# Patient Record
Sex: Female | Born: 1960 | Race: White | Hispanic: No | Marital: Single | Smoking: Former smoker
Health system: Southern US, Community
[De-identification: ages and names within clinical notes are randomized; demographics above are authoritative.]

## PROBLEM LIST (undated history)

## (undated) DIAGNOSIS — E785 Hyperlipidemia, unspecified: Secondary | ICD-10-CM

## (undated) DIAGNOSIS — IMO0002 Reserved for concepts with insufficient information to code with codable children: Secondary | ICD-10-CM

## (undated) DIAGNOSIS — D649 Anemia, unspecified: Secondary | ICD-10-CM

## (undated) DIAGNOSIS — T7840XA Allergy, unspecified, initial encounter: Secondary | ICD-10-CM

## (undated) DIAGNOSIS — M329 Systemic lupus erythematosus, unspecified: Secondary | ICD-10-CM

## (undated) DIAGNOSIS — J189 Pneumonia, unspecified organism: Secondary | ICD-10-CM

## (undated) HISTORY — DX: Hyperlipidemia, unspecified: E78.5

## (undated) HISTORY — DX: Allergy, unspecified, initial encounter: T78.40XA

## (undated) HISTORY — PX: WISDOM TOOTH EXTRACTION: SHX21

## (undated) HISTORY — DX: Anemia, unspecified: D64.9

## (undated) HISTORY — PX: TONSILLECTOMY: SUR1361

---

## 2018-08-31 ENCOUNTER — Ambulatory Visit (INDEPENDENT_AMBULATORY_CARE_PROVIDER_SITE_OTHER): Payer: Medicare Other | Admitting: Psychology

## 2018-08-31 ENCOUNTER — Encounter (HOSPITAL_COMMUNITY): Payer: Self-pay | Admitting: Psychology

## 2018-08-31 DIAGNOSIS — F4312 Post-traumatic stress disorder, chronic: Secondary | ICD-10-CM

## 2018-08-31 DIAGNOSIS — M329 Systemic lupus erythematosus, unspecified: Secondary | ICD-10-CM

## 2018-08-31 DIAGNOSIS — F1020 Alcohol dependence, uncomplicated: Secondary | ICD-10-CM
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
## 2018-09-03 ENCOUNTER — Telehealth (HOSPITAL_COMMUNITY): Payer: Self-pay | Admitting: Psychology

## 2018-09-07 ENCOUNTER — Telehealth (HOSPITAL_COMMUNITY): Payer: Self-pay | Admitting: Licensed Clinical Social Worker
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
## 2018-09-07 NOTE — Telephone Encounter (Signed)
PT called to inquire and schedule orientation session to start CD-IOP in this office. Appointment was made for Tuesday 3/10 at 10:30AM. PT verbalized enthusiasm and openness.

## 2018-09-11 ENCOUNTER — Ambulatory Visit (INDEPENDENT_AMBULATORY_CARE_PROVIDER_SITE_OTHER): Payer: Self-pay | Admitting: Licensed Clinical Social Worker

## 2018-09-11 DIAGNOSIS — F411 Generalized anxiety disorder: Secondary | ICD-10-CM

## 2018-09-11 DIAGNOSIS — F1020 Alcohol dependence, uncomplicated: Secondary | ICD-10-CM

## 2018-09-11 DIAGNOSIS — F4312 Post-traumatic stress disorder, chronic: Secondary | ICD-10-CM

## 2018-09-12 ENCOUNTER — Telehealth (HOSPITAL_COMMUNITY): Payer: Self-pay | Admitting: Psychology

## 2018-09-18 ENCOUNTER — Encounter (HOSPITAL_COMMUNITY): Payer: Self-pay | Admitting: Licensed Clinical Social Worker
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
## 2018-10-08 ENCOUNTER — Telehealth (HOSPITAL_COMMUNITY): Payer: Self-pay | Admitting: Psychology

## 2018-10-10 ENCOUNTER — Telehealth (HOSPITAL_COMMUNITY): Payer: Self-pay | Admitting: Psychology

## 2019-01-28 ENCOUNTER — Telehealth (HOSPITAL_COMMUNITY): Payer: Self-pay | Admitting: Licensed Clinical Social Worker

## 2019-03-21 ENCOUNTER — Telehealth (HOSPITAL_COMMUNITY): Payer: Self-pay | Admitting: Licensed Clinical Social Worker

## 2019-03-21 NOTE — Telephone Encounter (Signed)
Clinician return client phone call requesting to schedule discussion about engaging in [place] after not starting at initial start date due to health concerns. Clinician left VM as requested for return contact.

## 2019-03-22 ENCOUNTER — Telehealth (HOSPITAL_COMMUNITY): Payer: Self-pay | Admitting: Licensed Clinical Social Worker

## 2019-03-26 ENCOUNTER — Ambulatory Visit (INDEPENDENT_AMBULATORY_CARE_PROVIDER_SITE_OTHER): Payer: Medicare Other | Admitting: Licensed Clinical Social Worker

## 2019-03-26 ENCOUNTER — Other Ambulatory Visit: Payer: Self-pay

## 2019-03-26 DIAGNOSIS — F1020 Alcohol dependence, uncomplicated: Secondary | ICD-10-CM

## 2019-03-26 NOTE — Progress Notes (Signed)
Virtual Visit via Telephone Note  I connected with Mayrin Schmuck on 03/26/19 at  3:00 PM EDT by telephone and verified that I am speaking with the correct person using two identifiers.   I discussed the limitations, risks, security and privacy concerns of performing an evaluation and management service by telephone and the availability of in person appointments. I also discussed with the patient that there may be a patient responsible charge related to this service. The patient expressed understanding and agreed to proceed.  I discussed the assessment and treatment plan with the patient. The patient was provided an opportunity to ask questions and all were answered. The patient agreed with the plan and demonstrated an understanding of the instructions.   The patient was advised to call back or seek an in-person evaluation if the symptoms worsen or if the condition fails to improve as anticipated.  I provided 20 minutes of non-face-to-face time during this encounter.   [name], LCSW    THERAPIST PROGRESS NOTE  Session Time: 3:05pm-3:40pm  Participation Level: Active  Behavioral Response: NAAlertDysphoric and labile  Type of Therapy: Individual Therapy  Treatment Goals addressed: Coping and Diagnosis: alcohol use disorder  Interventions: Motivational Interviewing and Supportive  Summary: Shontell Prosser is a 58 y.o. female who presents with alcohol use disorder. Client states she has been drinking 2 bottles of wine every few days, 'because of my lupus it takes me a while to recover.' Client reports people close to her are saying they are worried and she is having 'new behaviors that are scary' which include day drinking and smoking cigarettes, which she only does when she drinks. 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 Client stated she has a call with [facility] for short term inpatient and is open to  keeping that planned contact for the afternoon however acknowledged going to inpatient or residential tx would be more of a superficial gesture to others than something she believes would help her. Client is interested in entering into [facility] and talking with PA about medications to help with cravings.  Suicidal/Homicidal: Nowithout intent/plan  Therapist Response: Clinician met with client via telehealth due to client forgetting date of scheduled appointment. Clinician inquired about current living situation and recent substance use. Clinician and client discussed options for different levels of care based on current level of alcohol use.  Plan: Return again in 1 weeks.   Diagnosis: Axis I: Alcohol Abuse      [name], LCSW 03/26/2019

## 2019-03-27 ENCOUNTER — Telehealth (HOSPITAL_COMMUNITY): Payer: Self-pay | Admitting: Licensed Clinical Social Worker

## 2019-04-12 ENCOUNTER — Other Ambulatory Visit: Payer: Self-pay

## 2019-04-12 ENCOUNTER — Ambulatory Visit (INDEPENDENT_AMBULATORY_CARE_PROVIDER_SITE_OTHER): Payer: Medicare Other | Admitting: Licensed Clinical Social Worker

## 2019-04-12 DIAGNOSIS — F1020 Alcohol dependence, uncomplicated: Secondary | ICD-10-CM

## 2019-04-12 DIAGNOSIS — F411 Generalized anxiety disorder: Secondary | ICD-10-CM | POA: Diagnosis not present

## 2019-04-22 NOTE — Progress Notes (Signed)
Virtual Visit via Telephone Note  I connected with Deanna Fischer on 04/22/19 at 11:00 AM EDT by telephone and verified that I am speaking with the correct person using two identifiers.   I discussed the limitations, risks, security and privacy concerns of performing an evaluation and management service by telephone and the availability of in person appointments. I also discussed with the patient that there may be a patient responsible charge related to this service. The patient expressed understanding and agreed to proceed.  I discussed the assessment and treatment plan with the patient. The patient was provided an opportunity to ask questions and all were answered. The patient agreed with the plan and demonstrated an understanding of the instructions.   The patient was advised to call back or seek an in-person evaluation if the symptoms worsen or if the condition fails to improve as anticipated.  I provided 45 minutes of non-face-to-face time during this encounter.   Deanna Messier, LCSW    THERAPIST PROGRESS NOTE  Session Time: 11am-11:50am  Participation Level: Active  Behavioral Response: NAAlertAnxious  Type of Therapy: Individual Therapy  Treatment Goals addressed: Coping and Diagnosis: Alcohol Use disorder  Interventions: Motivational Interviewing  Summary: Deanna Fischer is a 58 y.o. female who presents with alcohol use disorder. Client shared recent stressors, including finances which was a primary concern for her at the time. Client was notified medicare covers 80% of services and client states she is unable to pay the additional 20%. Client would like to complete the scholarship paperwork and be approved prior to starting group. Client is open to meeting with clinician individually in the mean time and attending AA regularly.  Suicidal/Homicidal: Nowithout intent/plan  Therapist Response: Clinician and client discuss progress and challenges since discharging from  inpatient detox. Clinician provided client with resources for local AA engagement, virtual and face to face. Clinician and client reviewed intake information, group rules, and insurance coverage. Due to financial strain, clinician sent client financial assistance/scholoarship paperwork.  Plan: Return again in 1 weeks.  Diagnosis: Axis I: Alcohol Abuse        Deanna Messier, LCSW 04/12/2019

## 2019-04-24 ENCOUNTER — Telehealth (HOSPITAL_COMMUNITY): Payer: Self-pay | Admitting: Licensed Clinical Social Worker

## 2020-04-16 ENCOUNTER — Ambulatory Visit: Payer: Medicare Other | Admitting: Neurology

## 2020-04-24 DIAGNOSIS — F4310 Post-traumatic stress disorder, unspecified: Secondary | ICD-10-CM | POA: Insufficient documentation

## 2020-04-24 HISTORY — DX: Post-traumatic stress disorder, unspecified: F43.10

## 2020-12-16 ENCOUNTER — Ambulatory Visit: Payer: Medicare Other | Admitting: Internal Medicine

## 2021-01-20 ENCOUNTER — Encounter: Payer: Self-pay | Admitting: Gastroenterology

## 2021-02-15 ENCOUNTER — Emergency Department (HOSPITAL_COMMUNITY)
Admission: EM | Admit: 2021-02-15 | Discharge: 2021-02-15 | Disposition: A | Discharge status: Home / Self Care | Payer: Medicare Other | Source: Home / Self Care | Attending: Emergency Medicine | Admitting: Emergency Medicine

## 2021-02-15 ENCOUNTER — Emergency Department (HOSPITAL_COMMUNITY)
Admission: EM | Admit: 2021-02-15 | Discharge: 2021-02-15 | Disposition: A | Discharge status: Home / Self Care | Payer: Medicare Other | Attending: Emergency Medicine | Admitting: Emergency Medicine

## 2021-02-15 ENCOUNTER — Other Ambulatory Visit: Payer: Self-pay

## 2021-02-15 ENCOUNTER — Emergency Department (HOSPITAL_COMMUNITY): Payer: Medicare Other

## 2021-02-15 DIAGNOSIS — W541XXA Struck by dog, initial encounter: Secondary | ICD-10-CM | POA: Insufficient documentation

## 2021-02-15 DIAGNOSIS — Y93K1 Activity, walking an animal: Secondary | ICD-10-CM | POA: Insufficient documentation

## 2021-02-15 DIAGNOSIS — F17210 Nicotine dependence, cigarettes, uncomplicated: Secondary | ICD-10-CM | POA: Insufficient documentation

## 2021-02-15 DIAGNOSIS — S022XXA Fracture of nasal bones, initial encounter for closed fracture: Secondary | ICD-10-CM | POA: Diagnosis not present

## 2021-02-15 DIAGNOSIS — Z5321 Procedure and treatment not carried out due to patient leaving prior to being seen by health care provider: Secondary | ICD-10-CM | POA: Insufficient documentation

## 2021-02-15 DIAGNOSIS — S0993XA Unspecified injury of face, initial encounter: Secondary | ICD-10-CM | POA: Diagnosis present

## 2021-02-15 DIAGNOSIS — S00212A Abrasion of left eyelid and periocular area, initial encounter: Secondary | ICD-10-CM | POA: Insufficient documentation

## 2021-02-15 DIAGNOSIS — Z23 Encounter for immunization: Secondary | ICD-10-CM | POA: Insufficient documentation

## 2021-02-15 DIAGNOSIS — S0081XA Abrasion of other part of head, initial encounter: Secondary | ICD-10-CM | POA: Insufficient documentation

## 2021-02-15 DIAGNOSIS — S62646A Nondisplaced fracture of proximal phalanx of right little finger, initial encounter for closed fracture: Secondary | ICD-10-CM

## 2021-02-15 DIAGNOSIS — R519 Headache, unspecified: Secondary | ICD-10-CM | POA: Insufficient documentation

## 2021-02-15 DIAGNOSIS — W19XXXA Unspecified fall, initial encounter: Secondary | ICD-10-CM | POA: Insufficient documentation

## 2021-02-15 MED ORDER — ONDANSETRON 4 MG PO TBDP: 4.0000 mg | ORAL_TABLET | Freq: Three times a day (TID) | ORAL | 0 refills | Status: DC | PRN

## 2021-02-15 MED ORDER — ACETAMINOPHEN 500 MG PO TABS
1000.0000 mg | ORAL_TABLET | Freq: Once | ORAL | Status: AC
  Administered 2021-02-15: 1000 mg via ORAL
  Filled 2021-02-15: qty 2

## 2021-02-15 MED ORDER — TETANUS-DIPHTH-ACELL PERTUSSIS 5-2.5-18.5 LF-MCG/0.5 IM SUSY
0.5000 mL | PREFILLED_SYRINGE | Freq: Once | INTRAMUSCULAR | Status: AC
  Administered 2021-02-15: 0.5 mL via INTRAMUSCULAR
  Filled 2021-02-15: qty 0.5

## 2021-02-15 MED ORDER — OXYCODONE HCL 5 MG PO TABS
5.0000 mg | ORAL_TABLET | Freq: Once | ORAL | Status: AC
  Administered 2021-02-15: 5 mg via ORAL
  Filled 2021-02-15: qty 1

## 2021-02-15 NOTE — Progress Notes (Signed)
Orthopedic Tech Progress Note Patient Details:  Margaree Sandhu 30-May-1961 [id number]  Ortho Devices Type of Ortho Device: Arm sling, Ulna gutter splint Ortho Device/Splint Location: rue Ortho Device/Splint Interventions: Ordered, Application, Adjustment   Post Interventions Patient Tolerated: Well Instructions Provided: Adjustment of device  Karolee Stamps 02/15/2021, 10:04 PM

## 2021-02-15 NOTE — ED Notes (Signed)
Patient states she is leaving d/t wait tie and having to be home. Advised patient to stay. Patient states she is fine and her taxi is on the way

## 2021-02-15 NOTE — ED Provider Notes (Signed)
Emergency Medicine Provider Triage Evaluation Note  Deanna Fischer , a 60 y.o. female  was evaluated in triage.  Pt complains of a fall that occurred last night while walking her dog. Had ct scans completed but left ama. Here for eval now and c/o right hand pain.  Review of Systems  Positive: Facial pain, hand pain Negative: vomiting  Physical Exam  BP 122/75 (BP Location: Right Arm)   Pulse 80   Temp 99.7 F (37.6 C)   Resp 16   SpO2 100%  Gen:   Awake, no distress   Resp:  Normal effort  MSK:   Moves extremities without difficulty  Other:  Superficial abrasion to the left side of the face, ecchymosis to the right hand  Medical Decision Making  Medically screening exam initiated at 3:58 PM.  Appropriate orders placed.  Deanna Fischer was informed that the remainder of the evaluation will be completed by another provider, this initial triage assessment does not replace that evaluation, and the importance of remaining in the ED until their evaluation is complete.     Rodney Booze, PA-C 02/15/21 1600    Lorelle Gibbs, DO 02/17/21 2321

## 2021-02-15 NOTE — ED Triage Notes (Addendum)
Pt states "A: I have lupus, B: I've got a lot going on." Pt states she fell approx 3hrs ago, ?LOC, not on thinners Pt states she noticed blood but isn't bleeding. States it was a bad fall, but "not so bad, just not good- this has never happened to me before." When asked how much alcohol she's had to drink tonight- she responded "I'm going to be very specific, but what time?" Then states "the equivalent of a bottle & a half but we're on a new day, so nothing..... but like 3 glasses." Poor historian, +ETOH. Abrasions noted to L side of the face, bleeding controlled in triage.  Pt states she believes quitting smoking & then having a cigarette last night is relevant. Unsure if something is wrong w her heart.

## 2021-02-15 NOTE — ED Provider Notes (Signed)
[facility] EMERGENCY DEPARTMENT Provider Note   CSN: [id number] Arrival date & time: 02/15/21  1509     History CC - Fall  Deanna Fischer is a 60 y.o. female.  HPI  60 year old female with a past medical history of SLE on Plaquenil presenting to the emergency department with left face pain and right hand pain following a fall.  Patient states that she was walking her dog yesterday when it saw another animal and took off running, pulling her to the ground.  She states that she stretched out her right hand to break her fall, but also impacted her left face.  She states that she is right-handed.  She was initially having a headache and some nausea and so came here to be evaluated.  She obtained CT scans but left due to prolonged wait times without being evaluated.  She came back at the recommendations of her friends.  She states that her headache and nausea have resolved.  She denies any neck pain.  She reports left face pain associated with some abrasions there, burning, constant.  It was not improved by coconut oil applied last night.  It was slightly improved with Tylenol.  Patient also reports right hand pain, mild, aching, around her fifth digit, nonradiating.  Is worse with using her right hand.  No past medical history on file.  There are no problems to display for this patient.   No past surgical history on file.   OB History   No obstetric history on file.     No family history on file.  Social History   Tobacco Use   Smoking status: Every Day    Packs/day: 0.50    Years: 25.00    Pack years: 12.50    Types: Cigarettes   Smokeless tobacco: Never    Home Medications Prior to Admission medications   Medication Sig Start Date End Date Taking? Authorizing Provider  ondansetron (ZOFRAN ODT) 4 MG disintegrating tablet Take 1 tablet (4 mg total) by mouth every 8 (eight) hours as needed for nausea or vomiting. 02/15/21  Yes Claud Kelp, MD   hydroxychloroquine (PLAQUENIL) 200 MG tablet Take 200 mg by mouth daily.    [provider]  levothyroxine (SYNTHROID, LEVOTHROID) 100 MCG tablet Take 100 mcg by mouth daily before breakfast.    [provider]    Allergies    Patient has no known allergies.  Review of Systems   Review of Systems  Constitutional:  Negative for chills and fever.  HENT:  Negative for ear pain and sore throat.   Eyes:  Negative for pain and visual disturbance.  Respiratory:  Negative for cough and shortness of breath.   Cardiovascular:  Negative for chest pain and palpitations.  Gastrointestinal:  Negative for abdominal pain and vomiting.  Genitourinary:  Negative for dysuria and hematuria.  Musculoskeletal:  Positive for arthralgias. Negative for back pain.  Skin:  Positive for wound. Negative for color change and rash.  Neurological:  Negative for seizures and syncope.  All other systems reviewed and are negative.  Physical Exam Updated Vital Signs BP (!) 145/85   Pulse (!) 57   Temp 98.1 F (36.7 C) (Oral)   Resp 17   Ht 5' 5"  (1.651 m)   Wt 56.7 kg   SpO2 98%   BMI 20.80 kg/m   Physical Exam Vitals and nursing note reviewed.  Constitutional:      General: She is not in acute distress.  Appearance: She is well-developed. She is not ill-appearing or toxic-appearing.  HENT:     Head: Normocephalic.     Comments: Scattered abrasions to the left periorbit, bridge of the nose, left cheek.  Mild left periorbital swelling.  No eye proptosis.  Extraocular movements are intact.  No septal hematoma.  Mild swelling to the nasal bridge with tenderness to palpation.  Midface is stable.  No trismus.  No malocclusion. Eyes:     Conjunctiva/sclera: Conjunctivae normal.  Cardiovascular:     Rate and Rhythm: Normal rate and regular rhythm.     Heart sounds: No murmur heard. Pulmonary:     Effort: Pulmonary effort is normal. No respiratory distress.     Breath sounds: Normal breath  sounds.  Abdominal:     Palpations: Abdomen is soft.     Tenderness: There is no abdominal tenderness. There is no guarding or rebound.  Musculoskeletal:        General: Swelling and tenderness present.     Cervical back: Normal range of motion and neck supple. No rigidity or tenderness.     Comments: Swelling and ecchymosis to the base of the right fifth digit on the dorsal aspect of the hand.  Associated tenderness to palpation.  Full range of motion in the right fifth MCP, PIP, and DIP.  2+ radial pulse.  Skin:    General: Skin is warm and dry.     Capillary Refill: Capillary refill takes less than 2 seconds.  Neurological:     Mental Status: She is alert and oriented to person, place, and time.    ED Results / Procedures / Treatments   Labs (all labs ordered are listed, but only abnormal results are displayed) Labs Reviewed - No data to display  EKG None  Radiology CT HEAD WO CONTRAST (5MM)  Result Date: 02/15/2021 CLINICAL DATA:  Fall EXAM: CT HEAD WITHOUT CONTRAST CT MAXILLOFACIAL WITHOUT CONTRAST CT CERVICAL SPINE WITHOUT CONTRAST TECHNIQUE: Multidetector CT imaging of the head, cervical spine, and maxillofacial structures were performed using the standard protocol without intravenous contrast. Multiplanar CT image reconstructions of the cervical spine and maxillofacial structures were also generated. COMPARISON:  None. FINDINGS: CT HEAD FINDINGS Brain: There is no mass, hemorrhage or extra-axial collection. The size and configuration of the ventricles and extra-axial CSF spaces are normal. The brain parenchyma is normal, without evidence of acute or chronic infarction. Vascular: No abnormal hyperdensity of the major intracranial arteries or dural venous sinuses. No intracranial atherosclerosis. Skull: The visualized skull base, calvarium and extracranial soft tissues are normal. CT MAXILLOFACIAL FINDINGS Osseous: There is a minimally displaced fracture of the right nasal bone.  Orbits: The globes are intact. Normal appearance of the intra- and extraconal fat. Symmetric extraocular muscles and optic nerves. Sinuses: No fluid levels or advanced mucosal thickening. Soft tissues: Normal visualized extracranial soft tissues. CT CERVICAL SPINE FINDINGS Alignment: No static subluxation. Facets are aligned. Occipital condyles and the lateral masses of C1-C2 are aligned. Reversal of normal cervical lordosis may be positional or due to muscle spasm. Skull base and vertebrae: No acute fracture. Soft tissues and spinal canal: No prevertebral fluid or swelling. No visible canal hematoma. Disc levels: No advanced spinal canal or neural foraminal stenosis. Upper chest: No pneumothorax, pulmonary nodule or pleural effusion. Other: Normal visualized paraspinal cervical soft tissues. IMPRESSION: 1. No acute intracranial abnormality. 2. Minimally displaced fracture of the right nasal bone. 3. No acute fracture or static subluxation of the cervical spine. Electronically Signed   By:  Ulyses Jarred M.D.   On: 02/15/2021 03:16   CT CERVICAL SPINE WO CONTRAST  Result Date: 02/15/2021 CLINICAL DATA:  Fall EXAM: CT HEAD WITHOUT CONTRAST CT MAXILLOFACIAL WITHOUT CONTRAST CT CERVICAL SPINE WITHOUT CONTRAST TECHNIQUE: Multidetector CT imaging of the head, cervical spine, and maxillofacial structures were performed using the standard protocol without intravenous contrast. Multiplanar CT image reconstructions of the cervical spine and maxillofacial structures were also generated. COMPARISON:  None. FINDINGS: CT HEAD FINDINGS Brain: There is no mass, hemorrhage or extra-axial collection. The size and configuration of the ventricles and extra-axial CSF spaces are normal. The brain parenchyma is normal, without evidence of acute or chronic infarction. Vascular: No abnormal hyperdensity of the major intracranial arteries or dural venous sinuses. No intracranial atherosclerosis. Skull: The visualized skull base,  calvarium and extracranial soft tissues are normal. CT MAXILLOFACIAL FINDINGS Osseous: There is a minimally displaced fracture of the right nasal bone. Orbits: The globes are intact. Normal appearance of the intra- and extraconal fat. Symmetric extraocular muscles and optic nerves. Sinuses: No fluid levels or advanced mucosal thickening. Soft tissues: Normal visualized extracranial soft tissues. CT CERVICAL SPINE FINDINGS Alignment: No static subluxation. Facets are aligned. Occipital condyles and the lateral masses of C1-C2 are aligned. Reversal of normal cervical lordosis may be positional or due to muscle spasm. Skull base and vertebrae: No acute fracture. Soft tissues and spinal canal: No prevertebral fluid or swelling. No visible canal hematoma. Disc levels: No advanced spinal canal or neural foraminal stenosis. Upper chest: No pneumothorax, pulmonary nodule or pleural effusion. Other: Normal visualized paraspinal cervical soft tissues. IMPRESSION: 1. No acute intracranial abnormality. 2. Minimally displaced fracture of the right nasal bone. 3. No acute fracture or static subluxation of the cervical spine. Electronically Signed   By: Ulyses Jarred M.D.   On: 02/15/2021 03:16   DG Hand Complete Right  Result Date: 02/15/2021 CLINICAL DATA:  Right hand pain status post fall breakage EXAM: RIGHT HAND - COMPLETE 3+ VIEW COMPARISON:  None. FINDINGS: Mildly displaced fracture of the lateral aspect of the base of the proximal phalanx of the little/fifth finger, which involves the articular surface and is seen best on the PA radiograph. There is no other fracture or dislocation. The joint spaces are preserved. Soft tissues are unremarkable. IMPRESSION: Mildly displaced fracture through the lateral aspect of the base of the proximal phalanx of the little finger, involving the articular surface. Electronically Signed   By: Merilyn Baba M.D.   On: 02/15/2021 16:45   CT Maxillofacial Wo Contrast  Result Date:  02/15/2021 CLINICAL DATA:  Fall EXAM: CT HEAD WITHOUT CONTRAST CT MAXILLOFACIAL WITHOUT CONTRAST CT CERVICAL SPINE WITHOUT CONTRAST TECHNIQUE: Multidetector CT imaging of the head, cervical spine, and maxillofacial structures were performed using the standard protocol without intravenous contrast. Multiplanar CT image reconstructions of the cervical spine and maxillofacial structures were also generated. COMPARISON:  None. FINDINGS: CT HEAD FINDINGS Brain: There is no mass, hemorrhage or extra-axial collection. The size and configuration of the ventricles and extra-axial CSF spaces are normal. The brain parenchyma is normal, without evidence of acute or chronic infarction. Vascular: No abnormal hyperdensity of the major intracranial arteries or dural venous sinuses. No intracranial atherosclerosis. Skull: The visualized skull base, calvarium and extracranial soft tissues are normal. CT MAXILLOFACIAL FINDINGS Osseous: There is a minimally displaced fracture of the right nasal bone. Orbits: The globes are intact. Normal appearance of the intra- and extraconal fat. Symmetric extraocular muscles and optic nerves. Sinuses: No fluid levels or  advanced mucosal thickening. Soft tissues: Normal visualized extracranial soft tissues. CT CERVICAL SPINE FINDINGS Alignment: No static subluxation. Facets are aligned. Occipital condyles and the lateral masses of C1-C2 are aligned. Reversal of normal cervical lordosis may be positional or due to muscle spasm. Skull base and vertebrae: No acute fracture. Soft tissues and spinal canal: No prevertebral fluid or swelling. No visible canal hematoma. Disc levels: No advanced spinal canal or neural foraminal stenosis. Upper chest: No pneumothorax, pulmonary nodule or pleural effusion. Other: Normal visualized paraspinal cervical soft tissues. IMPRESSION: 1. No acute intracranial abnormality. 2. Minimally displaced fracture of the right nasal bone. 3. No acute fracture or static subluxation  of the cervical spine. Electronically Signed   By: Ulyses Jarred M.D.   On: 02/15/2021 03:16    Procedures Procedures   Medications Ordered in ED Medications  Tdap (BOOSTRIX) injection 0.5 mL (0.5 mLs Intramuscular Given 02/15/21 2111)  oxyCODONE (Oxy IR/ROXICODONE) immediate release tablet 5 mg (5 mg Oral Given 02/15/21 2109)  acetaminophen (TYLENOL) tablet 1,000 mg (1,000 mg Oral Given 02/15/21 2109)    ED Course  I have reviewed the triage vital signs and the nursing notes.  Pertinent labs & imaging results that were available during my care of the patient were reviewed by me and considered in my medical decision making (see chart for details).    MDM Rules/Calculators/A&P                           60 year old female with lupus presenting with face pain and right hand pain following a mechanical fall yesterday.  Vital signs reviewed, within acceptable limits.  Physical exam is notable for abrasions to the left face and swelling tenderness to the right medial hand.  She has no septal hematoma, no eye proptosis.  There is no evidence of a retrobulbar hemorrhage or entrapment.  No septal deviation.  No trismus or malocclusion.  She has a normal neurologic exam.  I reviewed the imaging that was obtained yesterday, she has no skull fracture or intracranial hemorrhage.  She has a small nondisplaced right nasal bone fracture.  Given that she has no septal hematoma and is breathing easily, will provide outpatient ENT follow-up but is no emergent consultation indicated.  For her abrasions, recommended routine wound care.  For the patient's right hand, she has a acute fracture of the phalanx at the base of the fifth digit.  She is neurovascularly intact distal to this injury and has full range of motion in all the joints distally.  Will immobilize in an ulnar gutter splint in the intrinsic hand position and refer her to orthopedic surgery as an outpatient.  Will provide analgesia here and recommended  over-the-counter analgesics at home.  Given her nausea and headache that she reported earlier, she likely had a small concussion in light of negative imaging but she is currently asymptomatic.  Will provide concussion precautions and an outpatient prescription for Zofran as needed.  I believe that she is stable for discharge from the emergency department.  Patient is comfortable with that plan.  Strict return precautions discussed.  Final Clinical Impression(s) / ED Diagnoses Final diagnoses:  Closed fracture of nasal bone, initial encounter  Closed nondisplaced fracture of proximal phalanx of right little finger, initial encounter    Rx / DC Orders ED Discharge Orders          Ordered    ondansetron (ZOFRAN ODT) 4 MG disintegrating tablet  Every 8  hours PRN        02/15/21 2049             Claud Kelp, MD 02/15/21 2315    Luna Fuse, MD 02/18/21 [phone]

## 2021-02-15 NOTE — ED Triage Notes (Signed)
Pt was pulled by her dog and face planted on her concrete walkway last night. Seen for same last night and had CT scan done but LWBS. Back today for eval of dizziness, feeling "spacey" and nauseas.

## 2021-02-15 NOTE — ED Provider Notes (Signed)
Emergency Medicine Provider Triage Evaluation Note  Deanna Fischer , a 60 y.o. female  was evaluated in triage.  Pt complains of fall.  Patient fell today.  She is unsure if there was a loss of consciousness.  She cannot elaborate on how she fell.  She has EtOH on board.  There are numerous wounds and superficial abrasion noted to the left side of the face.  She is endorsing facial pain.  No chest pain, shortness of breath, abdominal pain, back pain, numbness, or weakness.  No treatment prior to arrival.   Review of Systems  Positive: Wounds, facial pain Negative: Chest pain, abdominal pain, shortness of breath, back pain, numbness, weakness  Physical Exam  BP 109/80 (BP Location: Left Arm)   Pulse 81   Temp 98.2 F (36.8 C)   Resp 14   SpO2 99%  Gen:   Awake, no distress   Resp:  Normal effort  MSK:   Moves extremities without difficulty  Other:  Superficial abrasions noted to most of the left side of the face.  No cranial nerve deficits.  Speech is slurred.  She appears acutely intoxicated.  Medical Decision Making  Medically screening exam initiated at 2:17 AM.  Appropriate orders placed.  Deanna Fischer was informed that the remainder of the evaluation will be completed by another provider, this initial triage assessment does not replace that evaluation, and the importance of remaining in the ED until their evaluation is complete.  Imaging has been ordered.  She will require further work-up and evaluation in the emergency department.   Joanne Gavel, PA-C 02/15/21 0220    Fatima Blank, MD 02/15/21 [phone]

## 2021-02-15 NOTE — Discharge Instructions (Addendum)
I have included some information about your injuries.  You have a finger fracture in your right pinky finger and a mild nasal bone fracture.  Typically neither of these things require surgery.  However, I would still recommend calling the orthopedic surgery office and the ear nose and throat office that she can be seen in their clinic in 1 to 2 weeks and be reevaluated.  Orthopedic surgery will need to follow your right finger fracture to guide the management and to decide when the splint can come off.  Please take Tylenol and ibuprofen as needed for pain.  As we discussed, I recommend applying sunscreen to your left face once your abrasions have healed to have the best cosmetic outcome possible, but there will likely be some scarring for some time.

## 2021-02-16 ENCOUNTER — Ambulatory Visit: Payer: Medicare Other | Admitting: Internal Medicine

## 2021-02-17 ENCOUNTER — Ambulatory Visit: Payer: Self-pay | Admitting: *Deleted

## 2021-02-17 NOTE — Telephone Encounter (Signed)
Pt calling in for [facility] after being seen in the ED after falling yesterday.  I had a serious fall yesterday.   I have a concussion.   I have a broken hand and broken nose.  Seen in the ED yesterday.  I was released without drsgs on my wounds from the ED.   This morning it looks like pus is draining from the wounds on my face.   Why did they not give me an antibiotic or do wound care? They told me to call [facility] y'all's number so that's why I'm calling this morning.     I'm not going back to the ED.   What do I need to do?   I don't have a PCP.    I found a non stick Curad bandage and put it on the wound on my face.    I think I need to be on an antibiotic.  "I can't believe they didn't put me on an antibiotic in the emergency room".     I have referred her to the closest urgent care to her house as she said she could not drive far.   She was agreeable to this suggestion.     She has an appt to be established with a new PCP in Oct 2022 but does not have a PCP in the meantime.   "That's why they gave me your number and said y'all would do the wound care".  ([facility] however they are not taking new pts).    It was suggested to call [facility] on [facility] when the office opened however it was too far away.   She preferred an urgent care closer to her residence.  She thanked me for my help.      Reason for Disposition  Health Information question, no triage required and triager able to answer question  Answer Assessment - Initial Assessment Questions 1. REASON FOR CALL or QUESTION: "What is your reason for calling today?" or "How can I best help you?" or "What question do you have that I can help answer?"     See documentation notes for details.    Seen in the ED yesterday after falling.   Needing to know where to go for wound care.  Protocols used: Information Only Call - No Triage-A-AH

## 2021-02-18 NOTE — ED Provider Notes (Signed)
.  Ortho Injury Treatment  Date/Time: 02/18/2021 7:56 AM Performed by: Luna Fuse, MD Authorized by: Luna Fuse, MD  Post-procedure neurovascular assessment: post-procedure neurovascularly intact Comments: Splint placed to upper extremity.      Luna Fuse, MD 02/18/21 [phone]

## 2021-02-19 ENCOUNTER — Telehealth: Payer: Self-pay

## 2021-02-19 NOTE — Telephone Encounter (Signed)
Patient has new patient appt scheduled w/ Dr. Sharlet Salina 10.10.22  Patient recently had bad fall (was seen in ED) & needs to be seen sooner than new patient appt  Says she doesn't think urgent care can tend to her needs from the fall like a pcp would  Would like to know if anyway possible can new patient appt be moved up  [place] callback [phone]

## 2021-02-22 NOTE — Telephone Encounter (Signed)
She had a visit on 02/16/21 she did not attend. I'm not sure how I can help

## 2021-02-22 NOTE — Telephone Encounter (Signed)
See below

## 2021-02-22 NOTE — Telephone Encounter (Signed)
Spoke with the patient and she verbalized understanding. She was very appreciative for the call back.

## 2021-03-02 ENCOUNTER — Ambulatory Visit: Payer: Medicare Other | Admitting: Gastroenterology

## 2021-04-12 ENCOUNTER — Ambulatory Visit: Payer: Medicare Other | Admitting: Internal Medicine

## 2021-07-04 DIAGNOSIS — D373 Neoplasm of uncertain behavior of appendix: Secondary | ICD-10-CM | POA: Insufficient documentation

## 2021-07-04 HISTORY — DX: Neoplasm of uncertain behavior of appendix: D37.3

## 2021-07-23 ENCOUNTER — Ambulatory Visit: Payer: Medicare Other | Admitting: Nurse Practitioner

## 2021-08-16 ENCOUNTER — Emergency Department (HOSPITAL_BASED_OUTPATIENT_CLINIC_OR_DEPARTMENT_OTHER): Payer: Medicare Other

## 2021-08-16 ENCOUNTER — Encounter (HOSPITAL_BASED_OUTPATIENT_CLINIC_OR_DEPARTMENT_OTHER): Payer: Self-pay

## 2021-08-16 ENCOUNTER — Other Ambulatory Visit: Payer: Self-pay

## 2021-08-16 ENCOUNTER — Inpatient Hospital Stay (HOSPITAL_BASED_OUTPATIENT_CLINIC_OR_DEPARTMENT_OTHER)
Admission: EM | Admit: 2021-08-16 | Discharge: 2021-08-29 | DRG: 758 | Disposition: A | Discharge status: Home Health | Payer: Medicare Other | Attending: Internal Medicine | Admitting: Internal Medicine

## 2021-08-16 DIAGNOSIS — K37 Unspecified appendicitis: Secondary | ICD-10-CM | POA: Diagnosis present

## 2021-08-16 DIAGNOSIS — N739 Female pelvic inflammatory disease, unspecified: Secondary | ICD-10-CM | POA: Diagnosis not present

## 2021-08-16 DIAGNOSIS — K449 Diaphragmatic hernia without obstruction or gangrene: Secondary | ICD-10-CM | POA: Diagnosis present

## 2021-08-16 DIAGNOSIS — K5090 Crohn's disease, unspecified, without complications: Secondary | ICD-10-CM | POA: Diagnosis present

## 2021-08-16 DIAGNOSIS — E860 Dehydration: Secondary | ICD-10-CM | POA: Diagnosis present

## 2021-08-16 DIAGNOSIS — Z87891 Personal history of nicotine dependence: Secondary | ICD-10-CM

## 2021-08-16 DIAGNOSIS — E876 Hypokalemia: Secondary | ICD-10-CM | POA: Diagnosis present

## 2021-08-16 DIAGNOSIS — R188 Other ascites: Secondary | ICD-10-CM

## 2021-08-16 DIAGNOSIS — M797 Fibromyalgia: Secondary | ICD-10-CM | POA: Diagnosis present

## 2021-08-16 DIAGNOSIS — Z79890 Hormone replacement therapy (postmenopausal): Secondary | ICD-10-CM

## 2021-08-16 DIAGNOSIS — B999 Unspecified infectious disease: Secondary | ICD-10-CM

## 2021-08-16 DIAGNOSIS — E039 Hypothyroidism, unspecified: Secondary | ICD-10-CM | POA: Diagnosis present

## 2021-08-16 DIAGNOSIS — K529 Noninfective gastroenteritis and colitis, unspecified: Secondary | ICD-10-CM | POA: Diagnosis not present

## 2021-08-16 DIAGNOSIS — F419 Anxiety disorder, unspecified: Secondary | ICD-10-CM | POA: Diagnosis present

## 2021-08-16 DIAGNOSIS — F4310 Post-traumatic stress disorder, unspecified: Secondary | ICD-10-CM | POA: Diagnosis present

## 2021-08-16 DIAGNOSIS — E878 Other disorders of electrolyte and fluid balance, not elsewhere classified: Secondary | ICD-10-CM

## 2021-08-16 DIAGNOSIS — M2550 Pain in unspecified joint: Secondary | ICD-10-CM | POA: Diagnosis present

## 2021-08-16 DIAGNOSIS — K659 Peritonitis, unspecified: Secondary | ICD-10-CM

## 2021-08-16 DIAGNOSIS — R059 Cough, unspecified: Secondary | ICD-10-CM | POA: Diagnosis not present

## 2021-08-16 DIAGNOSIS — Z20822 Contact with and (suspected) exposure to covid-19: Secondary | ICD-10-CM | POA: Diagnosis present

## 2021-08-16 DIAGNOSIS — M329 Systemic lupus erythematosus, unspecified: Secondary | ICD-10-CM | POA: Diagnosis present

## 2021-08-16 DIAGNOSIS — E871 Hypo-osmolality and hyponatremia: Secondary | ICD-10-CM | POA: Diagnosis present

## 2021-08-16 DIAGNOSIS — F418 Other specified anxiety disorders: Secondary | ICD-10-CM | POA: Diagnosis present

## 2021-08-16 DIAGNOSIS — N734 Female chronic pelvic peritonitis: Secondary | ICD-10-CM | POA: Diagnosis present

## 2021-08-16 DIAGNOSIS — F32A Depression, unspecified: Secondary | ICD-10-CM | POA: Diagnosis present

## 2021-08-16 DIAGNOSIS — B9620 Unspecified Escherichia coli [E. coli] as the cause of diseases classified elsewhere: Secondary | ICD-10-CM | POA: Diagnosis present

## 2021-08-16 DIAGNOSIS — K5792 Diverticulitis of intestine, part unspecified, without perforation or abscess without bleeding: Secondary | ICD-10-CM | POA: Diagnosis present

## 2021-08-16 DIAGNOSIS — Z79899 Other long term (current) drug therapy: Secondary | ICD-10-CM

## 2021-08-16 DIAGNOSIS — D649 Anemia, unspecified: Secondary | ICD-10-CM | POA: Diagnosis present

## 2021-08-16 DIAGNOSIS — R5381 Other malaise: Secondary | ICD-10-CM

## 2021-08-16 DIAGNOSIS — L24A9 Irritant contact dermatitis due friction or contact with other specified body fluids: Secondary | ICD-10-CM

## 2021-08-16 HISTORY — DX: Reserved for concepts with insufficient information to code with codable children: IMO0002

## 2021-08-16 HISTORY — DX: Systemic lupus erythematosus, unspecified: M32.9

## 2021-08-16 LAB — COMPREHENSIVE METABOLIC PANEL
ALT: 7 U/L (ref 0–44)
AST: 12 U/L — ABNORMAL LOW (ref 15–41)
Albumin: 3.7 g/dL (ref 3.5–5.0)
Alkaline Phosphatase: 44 U/L (ref 38–126)
Anion gap: 17 — ABNORMAL HIGH (ref 5–15)
BUN: 26 mg/dL — ABNORMAL HIGH (ref 6–20)
CO2: 21 mmol/L — ABNORMAL LOW (ref 22–32)
Calcium: 9.3 mg/dL (ref 8.9–10.3)
Chloride: 85 mmol/L — ABNORMAL LOW (ref 98–111)
Creatinine, Ser: 0.89 mg/dL (ref 0.44–1.00)
GFR, Estimated: >60 mL/min (ref >60)
Glucose, Bld: 80 mg/dL (ref 70–99)
Potassium: 3 mmol/L — ABNORMAL LOW (ref 3.5–5.1)
Sodium: 123 mmol/L — ABNORMAL LOW (ref 135–145)
Total Bilirubin: 0.5 mg/dL (ref 0.3–1.2)
Total Protein: 6.9 g/dL (ref 6.5–8.1)

## 2021-08-16 LAB — URINALYSIS, ROUTINE W REFLEX MICROSCOPIC
Bilirubin Urine: NEGATIVE
Glucose, UA: NEGATIVE mg/dL
Hgb urine dipstick: NEGATIVE
Ketones, ur: 15 mg/dL — AB
Leukocytes,Ua: NEGATIVE
Nitrite: NEGATIVE
Protein, ur: 30 mg/dL — AB
Specific Gravity, Urine: 1.012 (ref 1.005–1.030)
pH: 6 (ref 5.0–8.0)

## 2021-08-16 LAB — BASIC METABOLIC PANEL
Anion gap: 16 — ABNORMAL HIGH (ref 5–15)
BUN: 23 mg/dL — ABNORMAL HIGH (ref 6–20)
CO2: 20 mmol/L — ABNORMAL LOW (ref 22–32)
Calcium: 8.9 mg/dL (ref 8.9–10.3)
Chloride: 90 mmol/L — ABNORMAL LOW (ref 98–111)
Creatinine, Ser: 0.63 mg/dL (ref 0.44–1.00)
GFR, Estimated: >60 mL/min (ref >60)
Glucose, Bld: 73 mg/dL (ref 70–99)
Potassium: 3.1 mmol/L — ABNORMAL LOW (ref 3.5–5.1)
Sodium: 126 mmol/L — ABNORMAL LOW (ref 135–145)

## 2021-08-16 LAB — CBC
HCT: 34.3 % — ABNORMAL LOW (ref 36.0–46.0)
Hemoglobin: 12 g/dL (ref 12.0–15.0)
MCH: 32.4 pg (ref 26.0–34.0)
MCHC: 35 g/dL (ref 30.0–36.0)
MCV: 92.7 fL (ref 80.0–100.0)
Platelets: 222 10*3/uL (ref 150–400)
RBC: 3.7 MIL/uL — ABNORMAL LOW (ref 3.87–5.11)
RDW: 11.5 % (ref 11.5–15.5)
WBC: 15.9 10*3/uL — ABNORMAL HIGH (ref 4.0–10.5)
nRBC: 0 % (ref 0.0–0.2)

## 2021-08-16 LAB — LACTIC ACID, PLASMA
Lactic Acid, Venous: 0.8 mmol/L (ref 0.5–1.9)
Lactic Acid, Venous: 0.7 mmol/L (ref 0.5–1.9)

## 2021-08-16 LAB — RESP PANEL BY RT-PCR (FLU A&B, COVID) ARPGX2
Influenza A by PCR: NEGATIVE
Influenza B by PCR: NEGATIVE
SARS Coronavirus 2 by RT PCR: NEGATIVE

## 2021-08-16 LAB — LIPASE, BLOOD: Lipase: 28 U/L (ref 11–51)

## 2021-08-16 MED ORDER — POTASSIUM CHLORIDE 10 MEQ/100ML IV SOLN
10.0000 meq | Freq: Once | INTRAVENOUS | Status: AC
  Administered 2021-08-16: 10 meq via INTRAVENOUS
  Filled 2021-08-16: qty 100

## 2021-08-16 MED ORDER — FENTANYL CITRATE PF 50 MCG/ML IJ SOSY
50.0000 ug | PREFILLED_SYRINGE | Freq: Once | INTRAMUSCULAR | Status: AC
  Administered 2021-08-16: 50 ug via INTRAVENOUS
  Filled 2021-08-16: qty 1

## 2021-08-16 MED ORDER — SODIUM CHLORIDE 0.9 % IV SOLN: Freq: Once | INTRAVENOUS | Status: AC

## 2021-08-16 MED ORDER — SODIUM CHLORIDE 0.9 % IV BOLUS
1000.0000 mL | Freq: Once | INTRAVENOUS | Status: AC
  Administered 2021-08-16: 1000 mL via INTRAVENOUS

## 2021-08-16 MED ORDER — ONDANSETRON HCL 4 MG/2ML IJ SOLN
4.0000 mg | Freq: Once | INTRAMUSCULAR | Status: AC
  Administered 2021-08-16: 4 mg via INTRAVENOUS
  Filled 2021-08-16: qty 2

## 2021-08-16 MED ORDER — IOHEXOL 300 MG/ML  SOLN
75.0000 mL | Freq: Once | INTRAMUSCULAR | Status: AC | PRN
  Administered 2021-08-16: 75 mL via INTRAVENOUS

## 2021-08-16 MED ORDER — PIPERACILLIN-TAZOBACTAM 3.375 G IVPB
3.3750 g | Freq: Three times a day (TID) | INTRAVENOUS | Status: DC
  Administered 2021-08-16 – 2021-08-29 (×36): 3.375 g via INTRAVENOUS
  Filled 2021-08-16 (×41): qty 50

## 2021-08-16 NOTE — ED Triage Notes (Signed)
Patient here POV from Home with ABD Pain and SOB.  Pain has been present and worsening since Saturday. Pain is Epigastric and radiates to Lower Mid/R/L ABD. Sharp in [place]. Also endorses SOB with Deep Inspiration associated with Pain.  Moderate Nausea and Vomiting. No Diarrhea currently. No Known Fevers.  NAD Noted during Triage. A&Ox4. GCS 15. BIB Wheelchair.

## 2021-08-16 NOTE — ED Provider Notes (Signed)
[place] EMERGENCY DEPT Provider Note   CSN: [id number] Arrival date & time: 08/16/21  1524     History  Chief Complaint  Patient presents with   Abdominal Pain    Deanna Fischer is a 61 y.o. female.  The history is provided by the patient.  Abdominal Pain Pain location:  Generalized (worst in LLQ) Pain quality: aching   Pain radiates to:  Does not radiate Pain severity:  Mild Onset quality:  Gradual Duration:  3 days Timing:  Constant Progression:  Worsening Chronicity:  New Context: not previous surgeries, not sick contacts and not suspicious food intake   Relieved by:  Nothing Worsened by:  Nothing Associated symptoms: diarrhea and nausea   Associated symptoms: no anorexia, no belching, no chest pain, no chills, no constipation, no cough, no dysuria, no fatigue, no fever, no hematemesis, no hematochezia, no melena, no shortness of breath, no sore throat and no vomiting   Risk factors: has not had multiple surgeries   Risk factors comment:  Hx of lupus     Home Medications Prior to Admission medications   Medication Sig Start Date End Date Taking? Authorizing Provider  hydroxychloroquine (PLAQUENIL) 200 MG tablet Take 200 mg by mouth daily.    [provider]  levothyroxine (SYNTHROID, LEVOTHROID) 100 MCG tablet Take 100 mcg by mouth daily before breakfast.    [provider]  ondansetron (ZOFRAN ODT) 4 MG disintegrating tablet Take 1 tablet (4 mg total) by mouth every 8 (eight) hours as needed for nausea or vomiting. 02/15/21   Claud Kelp, MD      Allergies    Patient has no known allergies.    Review of Systems   Review of Systems  Constitutional:  Negative for chills, fatigue and fever.  HENT:  Negative for sore throat.   Respiratory:  Negative for cough and shortness of breath.   Cardiovascular:  Negative for chest pain.  Gastrointestinal:  Positive for abdominal pain, diarrhea and nausea. Negative for anorexia,  constipation, hematemesis, hematochezia, melena and vomiting.  Genitourinary:  Negative for dysuria.   Physical Exam Updated Vital Signs BP 97/64    Pulse 79    Temp (!) 97.1 F (36.2 C)    Resp 19    Ht 5' 5"  (1.651 m)    Wt 56.7 kg    SpO2 97%    BMI 20.80 kg/m  Physical Exam Vitals and nursing note reviewed.  Constitutional:      General: She is not in acute distress.    Appearance: She is well-developed. She is not ill-appearing.  HENT:     Head: Normocephalic and atraumatic.  Eyes:     Extraocular Movements: Extraocular movements intact.     Conjunctiva/sclera: Conjunctivae normal.     Pupils: Pupils are equal, round, and reactive to light.  Cardiovascular:     Rate and Rhythm: Normal rate and regular rhythm.     Heart sounds: Normal heart sounds. No murmur heard. Pulmonary:     Effort: Pulmonary effort is normal. No respiratory distress.     Breath sounds: Normal breath sounds.  Abdominal:     General: There is no distension.     Palpations: Abdomen is soft.     Tenderness: There is generalized abdominal tenderness. There is guarding.  Musculoskeletal:        General: No swelling.     Cervical back: Neck supple.  Skin:    General: Skin is warm and dry.     Capillary Refill:  Capillary refill takes less than 2 seconds.  Neurological:     Mental Status: She is alert.  Psychiatric:        Mood and Affect: Mood normal.    ED Results / Procedures / Treatments   Labs (all labs ordered are listed, but only abnormal results are displayed) Labs Reviewed  COMPREHENSIVE METABOLIC PANEL - Abnormal; Notable for the following components:      Result Value   Sodium 123 (*)    Potassium 3.0 (*)    Chloride 85 (*)    CO2 21 (*)    BUN 26 (*)    AST 12 (*)    Anion gap 17 (*)    All other components within normal limits  CBC - Abnormal; Notable for the following components:   WBC 15.9 (*)    RBC 3.70 (*)    HCT 34.3 (*)    All other components within normal limits   URINALYSIS, ROUTINE W REFLEX MICROSCOPIC - Abnormal; Notable for the following components:   Ketones, ur 15 (*)    Protein, ur 30 (*)    Bacteria, UA FEW (*)    All other components within normal limits  RESP PANEL BY RT-PCR (FLU A&B, COVID) ARPGX2  CULTURE, BLOOD (ROUTINE X 2)  CULTURE, BLOOD (ROUTINE X 2)  LIPASE, BLOOD  LACTIC ACID, PLASMA  LACTIC ACID, PLASMA  BASIC METABOLIC PANEL    EKG EKG Interpretation  Date/Time:  Monday August 16 2021 15:40:29 EST Ventricular Rate:  94 PR Interval:  140 QRS Duration: 82 QT Interval:  366 QTC Calculation: 457 R Axis:   84 Text Interpretation: Normal sinus rhythm Right atrial enlargement Borderline ECG No previous ECGs available Confirmed by Lennice Sites ([id number]) on 08/16/2021 4:06:45 PM  Radiology CT ABDOMEN PELVIS W CONTRAST  Result Date: 08/16/2021 CLINICAL DATA:  Abdominal pain with shortness of breath. Worsening since Saturday. EXAM: CT ABDOMEN AND PELVIS WITH CONTRAST TECHNIQUE: Multidetector CT imaging of the abdomen and pelvis was performed using the standard protocol following bolus administration of intravenous contrast. RADIATION DOSE REDUCTION: This exam was performed according to the departmental dose-optimization program which includes automated exposure control, adjustment of the mA and/or kV according to patient size and/or use of iterative reconstruction technique. CONTRAST:  70m OMNIPAQUE IOHEXOL 300 MG/ML  SOLN COMPARISON:  None. FINDINGS: Lower chest: Mild dependent bibasilar atelectasis. Normal heart size without pericardial or pleural effusion. Tiny hiatal hernia. Hepatobiliary: Normal liver. Normal gallbladder, without biliary ductal dilatation. Pancreas: Normal, without mass or ductal dilatation. Spleen: Normal in size, without focal abnormality. Adrenals/Urinary Tract: Normal adrenal glands. Normal kidneys, without hydronephrosis. Normal urinary bladder. Stomach/Bowel: Normal remainder of the stomach. Anatomy is  distorted, presumably secondary to subacute inflammation. The distal small bowel is inflamed. A moderately dilated appendix may be identified on coronal images 47 through 59. There is extensive interloop fluid including on 54/2. Small bowel loops are mildly dilated, likely due to secondary adynamic ileus. Vascular/Lymphatic: Aortic atherosclerosis. No abdominopelvic adenopathy. Reproductive: Hysterectomy.  No adnexal mass. Other: Pelvic cul-de-sac fluid collection with peritoneal enhancement and thickening, including at 7.8 x 4.3 cm on 63/2. Musculoskeletal: Degenerate disc disease at L4-5. Transitional S1 vertebral body. IMPRESSION: 1. Constellation of findings, most consistent with an inflammatory lower abdominal and pelvic process, including developing cul-de-sac abscess. Due to anatomic distortion from presumed subacute inflammation, it is difficult to delineate if this process represents infectious or inflammatory enteritis versus appendicitis. Consider surgical consultation for probable pelvic cul-de-sac fluid collection drainage. A potential clinical  strategies includes antibiotics and follow-up pelvic CT at 3-4 days. 2. Mild small bowel dilatation, suggesting secondary adynamic ileus. 3.  Aortic Atherosclerosis (ICD10-I70.0). 4.  Tiny hiatal hernia. These results will be called to the ordering clinician or representative by the Radiologist Assistant, and communication documented in the PACS or [employer]. Electronically Signed   By: Abigail Miyamoto M.D.   On: 08/16/2021 17:47    Procedures .Critical Care Performed by: Lennice Sites, DO Authorized by: Lennice Sites, DO   Critical care provider statement:    Critical care time (minutes):  35   Critical care was necessary to treat or prevent imminent or life-threatening deterioration of the following conditions: hyponatremia, IV antibiotics.   Critical care was time spent personally by me on the following activities:  Blood draw for specimens,  development of treatment plan with patient or surrogate, discussions with consultants, discussions with primary provider, evaluation of patient's response to treatment, obtaining history from patient or surrogate, ordering and performing treatments and interventions, ordering and review of laboratory studies, ordering and review of radiographic studies, pulse oximetry, re-evaluation of patient's condition and review of old charts   Care discussed with: admitting provider      Medications Ordered in ED Medications  potassium chloride 10 mEq in 100 mL IVPB (10 mEq Intravenous New Bag/Given 08/16/21 1906)  piperacillin-tazobactam (ZOSYN) IVPB 3.375 g (3.375 g Intravenous New Bag/Given 08/16/21 1905)  fentaNYL (SUBLIMAZE) injection 50 mcg (has no administration in time range)  fentaNYL (SUBLIMAZE) injection 50 mcg (50 mcg Intravenous Given 08/16/21 1624)  ondansetron (ZOFRAN) injection 4 mg (4 mg Intravenous Given 08/16/21 1622)  sodium chloride 0.9 % bolus 1,000 mL (0 mLs Intravenous Stopped 08/16/21 1842)  iohexol (OMNIPAQUE) 300 MG/ML solution 75 mL (75 mLs Intravenous Contrast Given 08/16/21 1713)  0.9 %  sodium chloride infusion ( Intravenous New Bag/Given 08/16/21 1906)    ED Course/ Medical Decision Making/ A&P                           Medical Decision Making Amount and/or Complexity of Data Reviewed Labs: ordered. Radiology: ordered.  Risk Prescription drug management. Decision regarding hospitalization.   Saharra Santo is here with abdominal pain.  History of lupus.  Normal vitals.  No fever.  Pain for the last 3 days that is worsening.  Associated with nausea, vomiting, diarrhea.  Denies any sick contacts.  Denies any suspicious food intake.  No history of abdominal surgeries.  Patient tender diffusely on exam with some guarding.  Differential diagnosis includes diverticulitis versus colitis versus less likely bowel obstruction, pancreatitis, cholecystitis.  Will get CBC, CMP, lipase,  urinalysis, CT scan abdomen and pelvis.  Will give patient IV fentanyl, IV fluids, IV Zofran and reevaluate.  Upon my review and interpretation of her labs she has hyponatremia of 123, potassium of 3.  White count of 15.9.  Per CT report by radiology which was reviewed by myself there are findings consistent with inflammatory process of the lower abdomen and pelvis.  This is likely an inflammatory/infectious enteritis.  However possibly could be appendicitis.  Talked with Dr. Kieth Brightly with general surgery who recommends IV antibiotics and admission to medicine.  This appears most likely to be an enteritis especially given history and physical.  Patient with unremarkable vitals.  Does not meet sepsis criteria given her vitals.  Patient was given IV Zosyn.  Blood cultures have been collected, lactic acid collected.  Will admit to medicine for further  care.  Does have significant hyponatremia which will need to be corrected as well.  This chart was dictated using voice recognition software.  Despite best efforts to proofread,  errors can occur which can change the documentation meaning.         Final Clinical Impression(s) / ED Diagnoses Final diagnoses:  Enteritis  Hyponatremia  Intra-abdominal infection    Rx / DC Orders ED Discharge Orders     None         Lennice Sites, DO 08/16/21 1911

## 2021-08-16 NOTE — Progress Notes (Signed)
Pharmacy Antibiotic Note  Deanna Fischer is a 61 y.o. female admitted on 08/16/2021 with intra-abdominal infection.  Pharmacy has been consulted for piperacillin/tazobactam dosing.  WBC elevated with worsening abdominal pain x 3 days. ClCr ~61 ml/min.   Plan: Zosyn 3.375g IV q8h (4 hour infusion).  Height: 5' 5"  (165.1 cm) Weight: 56.7 kg (125 lb) IBW/kg (Calculated) : 57  Temp (24hrs), Avg:97.1 F (36.2 C), Min:97.1 F (36.2 C), Max:97.1 F (36.2 C)  Recent Labs  Lab 08/16/21 1554  WBC 15.9*  CREATININE 0.89    Estimated Creatinine Clearance: 61.7 mL/min (by C-G formula based on SCr of 0.89 mg/dL).    No Known Allergies  Antimicrobials this admission: piptazo 2/13 >>    Microbiology results: 2/13  BCx: pend   Thank you for allowing pharmacy to be a part of this patients care.  Benetta Spar, PharmD, BCPS, BCCP Clinical Pharmacist  Please check AMION for all [facility] phone numbers After 10:00 PM, call [place] [phone]

## 2021-08-17 ENCOUNTER — Encounter (HOSPITAL_COMMUNITY): Payer: Self-pay | Admitting: Family Medicine

## 2021-08-17 DIAGNOSIS — M797 Fibromyalgia: Secondary | ICD-10-CM

## 2021-08-17 DIAGNOSIS — R059 Cough, unspecified: Secondary | ICD-10-CM | POA: Diagnosis not present

## 2021-08-17 DIAGNOSIS — K5792 Diverticulitis of intestine, part unspecified, without perforation or abscess without bleeding: Secondary | ICD-10-CM | POA: Diagnosis present

## 2021-08-17 DIAGNOSIS — E860 Dehydration: Secondary | ICD-10-CM | POA: Diagnosis present

## 2021-08-17 DIAGNOSIS — Z87891 Personal history of nicotine dependence: Secondary | ICD-10-CM | POA: Diagnosis not present

## 2021-08-17 DIAGNOSIS — F40240 Claustrophobia: Secondary | ICD-10-CM | POA: Insufficient documentation

## 2021-08-17 DIAGNOSIS — Z79890 Hormone replacement therapy (postmenopausal): Secondary | ICD-10-CM | POA: Diagnosis not present

## 2021-08-17 DIAGNOSIS — F4310 Post-traumatic stress disorder, unspecified: Secondary | ICD-10-CM | POA: Diagnosis present

## 2021-08-17 DIAGNOSIS — K35211 Acute appendicitis with generalized peritonitis, with perforation and abscess: Secondary | ICD-10-CM

## 2021-08-17 DIAGNOSIS — F32A Depression, unspecified: Secondary | ICD-10-CM | POA: Diagnosis present

## 2021-08-17 DIAGNOSIS — K5090 Crohn's disease, unspecified, without complications: Secondary | ICD-10-CM | POA: Diagnosis present

## 2021-08-17 DIAGNOSIS — D649 Anemia, unspecified: Secondary | ICD-10-CM | POA: Diagnosis present

## 2021-08-17 DIAGNOSIS — E039 Hypothyroidism, unspecified: Secondary | ICD-10-CM | POA: Diagnosis present

## 2021-08-17 DIAGNOSIS — M329 Systemic lupus erythematosus, unspecified: Secondary | ICD-10-CM | POA: Diagnosis present

## 2021-08-17 DIAGNOSIS — E876 Hypokalemia: Secondary | ICD-10-CM | POA: Diagnosis present

## 2021-08-17 DIAGNOSIS — F419 Anxiety disorder, unspecified: Secondary | ICD-10-CM | POA: Diagnosis present

## 2021-08-17 DIAGNOSIS — Z20822 Contact with and (suspected) exposure to covid-19: Secondary | ICD-10-CM | POA: Diagnosis present

## 2021-08-17 DIAGNOSIS — K529 Noninfective gastroenteritis and colitis, unspecified: Secondary | ICD-10-CM

## 2021-08-17 DIAGNOSIS — Z79899 Other long term (current) drug therapy: Secondary | ICD-10-CM | POA: Diagnosis not present

## 2021-08-17 DIAGNOSIS — B9620 Unspecified Escherichia coli [E. coli] as the cause of diseases classified elsewhere: Secondary | ICD-10-CM | POA: Diagnosis present

## 2021-08-17 DIAGNOSIS — E871 Hypo-osmolality and hyponatremia: Secondary | ICD-10-CM | POA: Diagnosis present

## 2021-08-17 DIAGNOSIS — N739 Female pelvic inflammatory disease, unspecified: Secondary | ICD-10-CM | POA: Diagnosis present

## 2021-08-17 DIAGNOSIS — K449 Diaphragmatic hernia without obstruction or gangrene: Secondary | ICD-10-CM | POA: Diagnosis present

## 2021-08-17 DIAGNOSIS — F418 Other specified anxiety disorders: Secondary | ICD-10-CM | POA: Diagnosis present

## 2021-08-17 DIAGNOSIS — E785 Hyperlipidemia, unspecified: Secondary | ICD-10-CM

## 2021-08-17 DIAGNOSIS — M2550 Pain in unspecified joint: Secondary | ICD-10-CM | POA: Diagnosis present

## 2021-08-17 DIAGNOSIS — K3521 Acute appendicitis with generalized peritonitis, with abscess: Secondary | ICD-10-CM

## 2021-08-17 HISTORY — DX: Systemic lupus erythematosus, unspecified: M32.9

## 2021-08-17 HISTORY — DX: Other specified anxiety disorders: F41.8

## 2021-08-17 HISTORY — DX: Fibromyalgia: M79.7

## 2021-08-17 HISTORY — DX: Claustrophobia: F40.240

## 2021-08-17 HISTORY — DX: Hyperlipidemia, unspecified: E78.5

## 2021-08-17 HISTORY — DX: Hypothyroidism, unspecified: E03.9

## 2021-08-17 LAB — MAGNESIUM: Magnesium: 1.9 mg/dL (ref 1.7–2.4)

## 2021-08-17 LAB — RENAL FUNCTION PANEL
Albumin: 2.9 g/dL — ABNORMAL LOW (ref 3.5–5.0)
Anion gap: 12 (ref 5–15)
BUN: 11 mg/dL (ref 6–20)
CO2: 20 mmol/L — ABNORMAL LOW (ref 22–32)
Calcium: 8.6 mg/dL — ABNORMAL LOW (ref 8.9–10.3)
Chloride: 97 mmol/L — ABNORMAL LOW (ref 98–111)
Creatinine, Ser: 0.48 mg/dL (ref 0.44–1.00)
GFR, Estimated: >60 mL/min (ref >60)
Glucose, Bld: 81 mg/dL (ref 70–99)
Phosphorus: 1.4 mg/dL — ABNORMAL LOW (ref 2.5–4.6)
Potassium: 3 mmol/L — ABNORMAL LOW (ref 3.5–5.1)
Sodium: 129 mmol/L — ABNORMAL LOW (ref 135–145)

## 2021-08-17 LAB — CBC WITH DIFFERENTIAL/PLATELET
Abs Immature Granulocytes: 0 10*3/uL (ref 0.00–0.07)
Band Neutrophils: 2 %
Basophils Absolute: 0 10*3/uL (ref 0.0–0.1)
Basophils Relative: 0 %
Eosinophils Absolute: 0 10*3/uL (ref 0.0–0.5)
Eosinophils Relative: 0 %
HCT: 32 % — ABNORMAL LOW (ref 36.0–46.0)
Hemoglobin: 11.2 g/dL — ABNORMAL LOW (ref 12.0–15.0)
Lymphocytes Relative: 6 %
Lymphs Abs: 0.7 10*3/uL (ref 0.7–4.0)
MCH: 33.3 pg (ref 26.0–34.0)
MCHC: 35 g/dL (ref 30.0–36.0)
MCV: 95.2 fL (ref 80.0–100.0)
Monocytes Absolute: 0.7 10*3/uL (ref 0.1–1.0)
Monocytes Relative: 6 %
Neutro Abs: 9.8 10*3/uL — ABNORMAL HIGH (ref 1.7–7.7)
Neutrophils Relative %: 86 %
Platelets: 266 10*3/uL (ref 150–400)
RBC: 3.36 MIL/uL — ABNORMAL LOW (ref 3.87–5.11)
RDW: 11.9 % (ref 11.5–15.5)
WBC: 11.1 10*3/uL — ABNORMAL HIGH (ref 4.0–10.5)
nRBC: 0 % (ref 0.0–0.2)

## 2021-08-17 LAB — GLUCOSE, CAPILLARY
Glucose-Capillary: 119 mg/dL — ABNORMAL HIGH (ref 70–99)
Glucose-Capillary: 119 mg/dL — ABNORMAL HIGH (ref 70–99)

## 2021-08-17 MED ORDER — ACETAMINOPHEN 325 MG PO TABS: 650.0000 mg | ORAL_TABLET | Freq: Four times a day (QID) | ORAL | Status: DC | PRN

## 2021-08-17 MED ORDER — PANTOPRAZOLE SODIUM 40 MG IV SOLR
40.0000 mg | INTRAVENOUS | Status: DC
  Administered 2021-08-17 – 2021-08-19 (×3): 40 mg via INTRAVENOUS
  Filled 2021-08-17 (×3): qty 10

## 2021-08-17 MED ORDER — ACETAMINOPHEN 650 MG RE SUPP: 650.0000 mg | Freq: Four times a day (QID) | RECTAL | Status: DC | PRN

## 2021-08-17 MED ORDER — BUPROPION HCL ER (XL) 300 MG PO TB24
300.0000 mg | ORAL_TABLET | Freq: Every day | ORAL | Status: DC
  Administered 2021-08-18 – 2021-08-29 (×12): 300 mg via ORAL
  Filled 2021-08-17 (×12): qty 1

## 2021-08-17 MED ORDER — ONDANSETRON HCL 4 MG PO TABS
4.0000 mg | ORAL_TABLET | Freq: Four times a day (QID) | ORAL | Status: DC | PRN
  Administered 2021-08-26: 4 mg via ORAL
  Filled 2021-08-17: qty 1

## 2021-08-17 MED ORDER — ONDANSETRON HCL 4 MG/2ML IJ SOLN
4.0000 mg | Freq: Four times a day (QID) | INTRAMUSCULAR | Status: DC | PRN
  Administered 2021-08-19 – 2021-08-24 (×6): 4 mg via INTRAVENOUS
  Filled 2021-08-17 (×6): qty 2

## 2021-08-17 MED ORDER — FENTANYL CITRATE PF 50 MCG/ML IJ SOSY
50.0000 ug | PREFILLED_SYRINGE | Freq: Once | INTRAMUSCULAR | Status: AC
  Administered 2021-08-17: 50 ug via INTRAVENOUS
  Filled 2021-08-17: qty 1

## 2021-08-17 MED ORDER — MAGNESIUM SULFATE 2 GM/50ML IV SOLN
2.0000 g | Freq: Once | INTRAVENOUS | Status: AC
  Administered 2021-08-17: 2 g via INTRAVENOUS
  Filled 2021-08-17: qty 50

## 2021-08-17 MED ORDER — MORPHINE SULFATE (PF) 4 MG/ML IV SOLN
4.0000 mg | INTRAVENOUS | Status: DC | PRN
  Administered 2021-08-17 – 2021-08-19 (×9): 4 mg via INTRAVENOUS
  Filled 2021-08-17 (×9): qty 1

## 2021-08-17 MED ORDER — ESCITALOPRAM OXALATE 10 MG PO TABS
10.0000 mg | ORAL_TABLET | Freq: Every day | ORAL | Status: DC
  Administered 2021-08-18 – 2021-08-29 (×12): 10 mg via ORAL
  Filled 2021-08-17 (×12): qty 1

## 2021-08-17 MED ORDER — LACTATED RINGERS IV BOLUS
500.0000 mL | Freq: Once | INTRAVENOUS | Status: AC
Start: 2021-08-17 — End: 2021-08-17
  Administered 2021-08-17: 500 mL via INTRAVENOUS

## 2021-08-17 MED ORDER — HYDROXYCHLOROQUINE SULFATE 200 MG PO TABS
200.0000 mg | ORAL_TABLET | Freq: Every day | ORAL | Status: DC
  Administered 2021-08-18 – 2021-08-20 (×3): 200 mg via ORAL
  Filled 2021-08-17 (×4): qty 1

## 2021-08-17 MED ORDER — POTASSIUM CHLORIDE IN NACL 20-0.9 MEQ/L-% IV SOLN
INTRAVENOUS | Status: DC
  Filled 2021-08-17 (×6): qty 1000

## 2021-08-17 MED ORDER — LACTATED RINGERS IV BOLUS
1000.0000 mL | Freq: Once | INTRAVENOUS | Status: AC
  Administered 2021-08-17: 1000 mL via INTRAVENOUS

## 2021-08-17 MED ORDER — LACTATED RINGERS IV SOLN: INTRAVENOUS | Status: DC

## 2021-08-17 MED ORDER — POTASSIUM PHOSPHATES 15 MMOLE/5ML IV SOLN
30.0000 mmol | Freq: Once | INTRAVENOUS | Status: AC
  Administered 2021-08-17: 30 mmol via INTRAVENOUS
  Filled 2021-08-17: qty 10

## 2021-08-17 MED ORDER — LEVOTHYROXINE SODIUM 100 MCG PO TABS
100.0000 ug | ORAL_TABLET | Freq: Every day | ORAL | Status: DC
  Administered 2021-08-18 – 2021-08-29 (×11): 100 ug via ORAL
  Filled 2021-08-17 (×11): qty 1

## 2021-08-17 NOTE — H&P (Signed)
History and Physical    Patient: Deanna Fischer PYK:[id number] DOB: 08/15/1960 DOA: 08/16/2021 DOS: the patient was seen and examined on 08/17/2021 PCP: Patient, No Pcp Per (Inactive)  Patient coming from: Home  Chief Complaint:  Chief Complaint  Patient presents with   Abdominal Pain    HPI: Deanna Fischer is a 61 y.o. female with medical history significant of hypothyroidism, SLE, depression with anxiety, claustrophobia, PTSD, fibromyositis who is coming to the emergency department with complaints of abdominal pain since last week associated with multiple episodes of emesis, diarrhea and then constipation since Friday.  She had some melanotic stools earlier last week. No flank pain, dysuria, frequency or hematuria.  No fever, chills, night sweats, sore throat, dyspnea, productive cough, wheezing or hemoptysis.  No chest pain, palpitations, diaphoresis, orthopnea or pitting edema of the lower extremities.  No polyuria, polydipsia, polyphagia or blurred vision.    Review of Systems: As mentioned in the history of present illness. All other systems reviewed and are negative. Past Medical History:  Diagnosis Date   Depression with anxiety 08/17/2021   Hypothyroidism 08/17/2021   Lupus ([place])    History reviewed. No pertinent surgical history. Social History:  reports that she has quit smoking. Her smoking use included cigarettes. She has a 12.50 pack-year smoking history. She has never used smokeless tobacco. She reports that she does not drink alcohol and does not use drugs.  No Known Allergies  Family History  Problem Relation Age of Onset   Breast cancer Mother    COPD Mother    Coronary artery disease Father    Arrhythmia Father    Heart attack Father    Breast cancer Sister     Prior to Admission medications   Medication Sig Start Date End Date Taking? Authorizing Provider  buPROPion (WELLBUTRIN XL) 300 MG 24 hr tablet Take 300 mg by mouth daily. 07/29/21  Yes [provider]  escitalopram (LEXAPRO) 10 MG tablet Take 10 mg by mouth daily. 07/17/21  Yes [provider]  hydroxychloroquine (PLAQUENIL) 200 MG tablet Take 200 mg by mouth daily.   Yes [provider]  ibuprofen (ADVIL) 200 MG tablet Take 200 mg by mouth every 6 (six) hours as needed.   Yes [provider]  levothyroxine (SYNTHROID, LEVOTHROID) 100 MCG tablet Take 100 mcg by mouth daily before breakfast.   Yes [provider]  ondansetron (ZOFRAN ODT) 4 MG disintegrating tablet Take 1 tablet (4 mg total) by mouth every 8 (eight) hours as needed for nausea or vomiting. Patient not taking: Reported on 08/17/2021 02/15/21   Claud Kelp, MD    Physical Exam: Vitals:   08/17/21 0900 08/17/21 1000 08/17/21 1155 08/17/21 1312  BP: 99/62 110/65 105/75 110/75  Pulse: 71 77 81 81  Resp: 14 14 (!) 22   Temp:    98.1 F (36.7 C)  TempSrc:    Oral  SpO2: 96% 100% 100% 100%  Weight:      Height:       Physical Exam Constitutional:      Appearance: She is well-developed and normal weight.  HENT:     Head: Normocephalic and atraumatic.     Mouth/Throat:     Mouth: Mucous membranes are moist.  Eyes:     General: No scleral icterus.    Pupils: Pupils are equal, round, and reactive to light.  Cardiovascular:     Rate and Rhythm: Normal rate and regular rhythm.  Pulmonary:     Effort: Pulmonary  effort is normal.     Breath sounds: Normal breath sounds.  Abdominal:     General: Abdomen is flat. Bowel sounds are decreased. There is no distension.     Palpations: Abdomen is soft.     Tenderness: There is generalized abdominal tenderness and tenderness in the right lower quadrant and left lower quadrant. There is guarding.  Musculoskeletal:     Cervical back: Neck supple.  Skin:    General: Skin is warm and dry.  Neurological:     General: No focal deficit present.     Mental Status: She is alert and oriented to person, place, and time.  Psychiatric:         Mood and Affect: Mood normal.        Behavior: Behavior normal.     Data Reviewed:  There are no new results to review at this time.  Assessment and Plan: Principal Problem:   Enteritis Admit to MedSurg/inpatient. Clear liquid diet. N.p.o. after midnight. Continue IV fluids. Antiemetics as needed. Analgesics as needed. Continue Zosyn 3.375 g every 8 hours. General surgery and IR involvement appreciated.  Active Problems:   Hyponatremia Due to GI losses. Continue IV fluids. Follow-up sodium level.    Hypokalemia Replacing. Magnesium was supplemented. Follow-up potassium level.    Hypophosphatemia Replacing. Follow-up potassium level.    Normocytic anemia In the setting of chronic AI disease. Monitor hematocrit and hemoglobin.    Depression with anxiety Continue bupropion XL 300 mg p.o. daily. Continue Lexapro 10 mg p.o. daily.    Systemic lupus erythematosus Continue hydroxychloroquine 200 mg p.o. daily. Follow-up with rheumatology as an outpatient.    Hypothyroidism Continue levothyroxine 100 mcg p.o. daily.  Advance Care Planning:   Code Status: Full Code   Consults: General surgery Irwin Brakeman, MD). Interventional radiology (K. Allred PA-C)  Family Communication:   Severity of Illness: The appropriate patient status for this patient is OBSERVATION. Observation status is judged to be reasonable and necessary in order to provide the required intensity of service to ensure the patient's safety. The patient's presenting symptoms, physical exam findings, and initial radiographic and laboratory data in the context of their medical condition is felt to place them at decreased risk for further clinical deterioration. Furthermore, it is anticipated that the patient will be medically stable for discharge from the hospital within 2 midnights of admission.   Author: Reubin Milan, MD 08/17/2021 5:13 PM  For on call review [email or web address].   This  document was prepared using Dragon voice recognition software and may contain some unintended transcription errors.

## 2021-08-17 NOTE — Consult Note (Addendum)
Chief Complaint: Patient was seen in consultation today for CT-guided drainage of pelvic abscess Chief Complaint  Patient presents with   Abdominal Pain     Referring Physician(s): Gross,S  Supervising Physician: Markus Daft  Patient Status: [facility] - In-pt  History of Present Illness: Deanna Fischer is a 61 y.o. female ex-smoker with past medical history significant for lupus, arthritis, anxiety disorder, and hypothyroidism who   presented to [place] yesterday with worsening lower abdominal pain associated with nausea and vomiting with constipation alternating with diarrhea. She has also had diminished appetite and occasional cough along with some lower back pain.  She currently denies fever, headache, chest pain, worsening dyspnea although lower abdominal pain is exacerbated with coughing or deep breathing.  CT of the abdomen pelvis on 2/13 revealed:   1. Constellation of findings, most consistent with an inflammatory lower abdominal and pelvic process, including developing cul-de-sac abscess. Due to anatomic distortion from presumed subacute inflammation, it is difficult to delineate if this process represents infectious or inflammatory enteritis versus appendicitis. Consider surgical consultation for probable pelvic cul-de-sac fluid collection drainage. A potential clinical strategies includes antibiotics and follow-up pelvic CT at 3-4 days. 2. Mild small bowel dilatation, suggesting secondary adynamic ileus. 3.  Aortic Atherosclerosis (ICD10-I70.0). 4.  Tiny hiatal hernia.  Patient was subsequently transferred to [facility] and admitted to hospitalist service for further evaluation and treatment.  She has had no prior abdominal surgeries or colonoscopy.  Latest labs include WBC 11.1, hemoglobin 11.2, platelets 266k, potassium 3, creatinine 0.48.  PT/INR pending.  Blood cultures pending, COVID-19 negative. Request now received from surgery for image guided  pelvic abscess drain placement.    Allergies: Patient has no known allergies.  Medications: Prior to Admission medications   Medication Sig Start Date End Date Taking? Authorizing Provider  buPROPion (WELLBUTRIN XL) 300 MG 24 hr tablet Take 300 mg by mouth daily. 07/29/21  Yes [provider]  escitalopram (LEXAPRO) 10 MG tablet Take 10 mg by mouth daily. 07/17/21  Yes [provider]  hydroxychloroquine (PLAQUENIL) 200 MG tablet Take 200 mg by mouth daily.   Yes [provider]  ibuprofen (ADVIL) 200 MG tablet Take 200 mg by mouth every 6 (six) hours as needed.   Yes [provider]  levothyroxine (SYNTHROID, LEVOTHROID) 100 MCG tablet Take 100 mcg by mouth daily before breakfast.   Yes [provider]  ondansetron (ZOFRAN ODT) 4 MG disintegrating tablet Take 1 tablet (4 mg total) by mouth every 8 (eight) hours as needed for nausea or vomiting. Patient not taking: Reported on 08/17/2021 02/15/21   Claud Kelp, MD     History reviewed. No pertinent family history.  Social History   Socioeconomic History   Marital status: Single    Spouse name: Not on file   Number of children: Not on file   Years of education: Not on file   Highest education level: Not on file  Occupational History   Not on file  Tobacco Use   Smoking status: Former    Packs/day: 0.50    Years: 25.00    Pack years: 12.50    Types: Cigarettes   Smokeless tobacco: Never  Substance and Sexual Activity   Alcohol use: Never   Drug use: Never   Sexual activity: Not on file  Other Topics Concern   Not on file  Social History Narrative   Not on file   Social Determinants of Health   Financial Resource Strain: Not  on file  Food Insecurity: Not on file  Transportation Needs: Not on file  Physical Activity: Not on file  Stress: Not on file  Social Connections: Not on file      Review of Systems see above  Vital Signs: BP 110/75 (BP Location: Right Arm)     Pulse 81    Temp 98.1 F (36.7 C) (Oral)    Resp (!) 22    Ht 5\' 5"  (1.651 m)    Wt 125 lb (56.7 kg)    SpO2 100%    BMI 20.80 kg/m   Physical Exam awake, alert.  Chest clear to auscultation bilaterally.  Heart with regular rate and rhythm.  Abdomen soft, few bowel sounds, some diffuse abdominal tenderness noted but most pronounced in anterior lower regions; no lower extremity edema  Imaging: CT ABDOMEN PELVIS W CONTRAST  Result Date: 08/16/2021 CLINICAL DATA:  Abdominal pain with shortness of breath. Worsening since Saturday. EXAM: CT ABDOMEN AND PELVIS WITH CONTRAST TECHNIQUE: Multidetector CT imaging of the abdomen and pelvis was performed using the standard protocol following bolus administration of intravenous contrast. RADIATION DOSE REDUCTION: This exam was performed according to the departmental dose-optimization program which includes automated exposure control, adjustment of the mA and/or kV according to patient size and/or use of iterative reconstruction technique. CONTRAST:  58mL OMNIPAQUE IOHEXOL 300 MG/ML  SOLN COMPARISON:  None. FINDINGS: Lower chest: Mild dependent bibasilar atelectasis. Normal heart size without pericardial or pleural effusion. Tiny hiatal hernia. Hepatobiliary: Normal liver. Normal gallbladder, without biliary ductal dilatation. Pancreas: Normal, without mass or ductal dilatation. Spleen: Normal in size, without focal abnormality. Adrenals/Urinary Tract: Normal adrenal glands. Normal kidneys, without hydronephrosis. Normal urinary bladder. Stomach/Bowel: Normal remainder of the stomach. Anatomy is distorted, presumably secondary to subacute inflammation. The distal small bowel is inflamed. A moderately dilated appendix may be identified on coronal images 47 through 59. There is extensive interloop fluid including on 54/2. Small bowel loops are mildly dilated, likely due to secondary adynamic ileus. Vascular/Lymphatic: Aortic atherosclerosis. No abdominopelvic adenopathy.  Reproductive: Hysterectomy.  No adnexal mass. Other: Pelvic cul-de-sac fluid collection with peritoneal enhancement and thickening, including at 7.8 x 4.3 cm on 63/2. Musculoskeletal: Degenerate disc disease at L4-5. Transitional S1 vertebral body. IMPRESSION: 1. Constellation of findings, most consistent with an inflammatory lower abdominal and pelvic process, including developing cul-de-sac abscess. Due to anatomic distortion from presumed subacute inflammation, it is difficult to delineate if this process represents infectious or inflammatory enteritis versus appendicitis. Consider surgical consultation for probable pelvic cul-de-sac fluid collection drainage. A potential clinical strategies includes antibiotics and follow-up pelvic CT at 3-4 days. 2. Mild small bowel dilatation, suggesting secondary adynamic ileus. 3.  Aortic Atherosclerosis (ICD10-I70.0). 4.  Tiny hiatal hernia. These results will be called to the ordering clinician or representative by the Radiologist Assistant, and communication documented in the PACS or [employer]. Electronically Signed   By: Abigail Miyamoto M.D.   On: 08/16/2021 17:47    Labs:  CBC: Recent Labs    08/16/21 1554 08/17/21 1353  WBC 15.9* 11.1*  HGB 12.0 11.2*  HCT 34.3* 32.0*  PLT 222 266    COAGS: No results for input(s): INR, APTT in the last 8760 hours.  BMP: Recent Labs    08/16/21 1554 08/16/21 1840 08/17/21 1353  NA 123* 126* 129*  K 3.0* 3.1* 3.0*  CL 85* 90* 97*  CO2 21* 20* 20*  GLUCOSE 80 73 81  BUN 26* 23* 11  CALCIUM 9.3 8.9 8.6*  CREATININE 0.89 0.63 0.48  GFRNONAA >60 >60 >60    LIVER FUNCTION TESTS: Recent Labs    08/16/21 1554 08/17/21 1353  BILITOT 0.5  --   AST 12*  --   ALT 7  --   ALKPHOS 44  --   PROT 6.9  --   ALBUMIN 3.7 2.9*    TUMOR MARKERS: No results for input(s): AFPTM, CEA, CA199, CHROMGRNA in the last 8760 hours.  Assessment and Plan: 61 y.o. female ex-smoker with past medical history  significant for lupus, arthritis, anxiety disorder, and hypothyroidism who presented to [place] yesterday with worsening lower abdominal pain associated with nausea and vomiting with constipation alternating with diarrhea. She has also had diminished appetite and occasional cough along with some lower back pain.  She currently denies fever, headache, chest pain, worsening dyspnea although lower abdominal pain is exacerbated with coughing or deep breathing.  CT of the abdomen pelvis on 2/13 revealed:   1. Constellation of findings, most consistent with an inflammatory lower abdominal and pelvic process, including developing cul-de-sac abscess. Due to anatomic distortion from presumed subacute inflammation, it is difficult to delineate if this process represents infectious or inflammatory enteritis versus appendicitis. Consider surgical consultation for probable pelvic cul-de-sac fluid collection drainage. A potential clinical strategies includes antibiotics and follow-up pelvic CT at 3-4 days. 2. Mild small bowel dilatation, suggesting secondary adynamic ileus. 3.  Aortic Atherosclerosis (ICD10-I70.0). 4.  Tiny hiatal hernia.  Patient was subsequently transferred to [facility] and admitted to hospitalist service for further evaluation and treatment.  She has had no prior abdominal surgeries or colonoscopy.  She is afebrile. Latest labs include WBC 11.1, hemoglobin 11.2, platelets 266k, potassium 3, creatinine 0.48.  PT/INR pending.  Blood cultures pending, COVID-19 negative. Request now received from surgery for image guided pelvic abscess drain placement.  Imaging studies have been reviewed by Dr. Anselm Pancoast. Risks and benefits discussed with the patient including bleeding, infection, damage to adjacent structures, bowel perforation/fistula connection, and sepsis.  All of the patient's questions were answered, patient is agreeable to proceed. Consent signed and in  chart.  Procedure tent scheduled for 2/15.   Thank you for this interesting consult.  I greatly enjoyed meeting Kristen Fromm and look forward to participating in their care.  A copy of this report was sent to the requesting provider on this date.  Electronically Signed: D. Rowe Robert, PA-C 08/17/2021, 3:40 PM   I spent a total of 25 minutes    in face to face in clinical consultation, greater than 50% of which was counseling/coordinating care for CT guided pelvic abscess drainage

## 2021-08-17 NOTE — ED Notes (Signed)
MD made aware of low BP (85/57) and request for IVF made // orders placed by MD

## 2021-08-17 NOTE — Progress Notes (Signed)
Pharmacy Antibiotic Note  Deanna Fischer is a 61 y.o. female admitted on 08/16/2021 with intra-abdominal infection. Pharmacy has been consulted for piperacillin/tazobactam dosing. Pt is afebrile and WBC is elevated. Scr is WNL. Lacit acid is <2.   Plan: Zosyn 3.375g IV q8h (4 hour infusion). F/u renal fxn, C&S, clinical status Pharmacy will sign off and only follow peripherally as no dose adjustments are anticipated.   Height: 5\' 5"  (165.1 cm) Weight: 56.7 kg (125 lb) IBW/kg (Calculated) : 57  Temp (24hrs), Avg:98.1 F (36.7 C), Min:97.1 F (36.2 C), Max:99 F (37.2 C)  Recent Labs  Lab 08/16/21 1554 08/16/21 1702 08/16/21 1840 08/16/21 1902  WBC 15.9*  --   --   --   CREATININE 0.89  --  0.63  --   LATICACIDVEN  --  0.8  --  0.7     Estimated Creatinine Clearance: 68.6 mL/min (by C-G formula based on SCr of 0.63 mg/dL).    No Known Allergies  Antimicrobials this admission: piptazo 2/13 >>    Microbiology results: 2/13  BCx: NGTD  Thank you for allowing pharmacy to be a part of this patients care.  Salome Arnt, PharmD, BCPS Clinical Pharmacist Please see AMION for all pharmacy numbers 08/17/2021 7:58 AM

## 2021-08-17 NOTE — ED Notes (Signed)
After pt ambulated to restroom she started to c/o abd pain with a rating 10/10. MD made aware and request for pain med made

## 2021-08-17 NOTE — Consult Note (Addendum)
Consult Note  Deanna Fischer 30-Jan-1961  [id number].    Requesting MD: Dr. Olevia Bowens Chief Complaint/Reason for Consult: intraabdominal abscess  HPI:  61 year old female with medical history significant for lupus, hypothyroidism, arthritis, GAD who presented to [facility] due to acute on set sharp abdominal pain worst in the right lower quadrant that began 3 days prior and has been constant and worsening. She has had associated nausea/emesis leading up to pain (she describes episode of projectile vomiting on 2/8) and constipation alternating with diarrhea. Most recently she has had diarrhea with last BM 2/11. She has had loss of appetite but has been drinking fluids without worsening pain. She has a new nonproductive cough. She denies fever, chills, hematemesis, hematochezia, melena. She denies history of prior abdominal surgeries.  Work up in ED significant for WBC 15.9, hyponatremia, hypokalemia, normal lactic acid. CT scan showing: "1. Constellation of findings, most consistent with an inflammatory lower abdominal and pelvic process, including developing cul-de-sac abscess. Question if this represents infectious or inflammatory enteritis versus appendicitis. 2. Mild small bowel dilatation, suggesting secondary adynamic ileus."  Patient was transferred to [facility] and admitted to hospitialist service for further evaluation and treatment.  Since presentation her pain has been intermittently improved with IV pain medications. She denies nausea currently but has received antiemetics.   She is on plaquenil for lupus. She has recently established care with PCP who recommended routine colonoscopy which she has not had done yet. No prior colonoscopy. Family history of diverticulitis in her mother  Substance use: former cigarette smoker Allergies: NKDA Blood thinners: none Past Surgeries: no prior abd surgeries   ROS: Review of Systems  Constitutional:  Negative for chills and fever.   Respiratory:  Positive for cough. Negative for shortness of breath and wheezing.   Cardiovascular:  Negative for chest pain, palpitations and leg swelling.  Gastrointestinal:  Positive for abdominal pain, constipation, diarrhea, nausea and vomiting. Negative for blood in stool and melena.  Genitourinary: Negative.    No family history on file.  Past Medical History:  Diagnosis Date   Lupus ([place])     History reviewed. No pertinent surgical history.  Social History:  reports that she has quit smoking. Her smoking use included cigarettes. She has a 12.50 pack-year smoking history. She has never used smokeless tobacco. She reports that she does not drink alcohol and does not use drugs.  Allergies: No Known Allergies  Medications Prior to Admission  Medication Sig Dispense Refill   buPROPion (WELLBUTRIN XL) 300 MG 24 hr tablet Take 300 mg by mouth daily.     escitalopram (LEXAPRO) 10 MG tablet Take 10 mg by mouth daily.     hydroxychloroquine (PLAQUENIL) 200 MG tablet Take 200 mg by mouth daily.     ibuprofen (ADVIL) 200 MG tablet Take 200 mg by mouth every 6 (six) hours as needed.     levothyroxine (SYNTHROID, LEVOTHROID) 100 MCG tablet Take 100 mcg by mouth daily before breakfast.     ondansetron (ZOFRAN ODT) 4 MG disintegrating tablet Take 1 tablet (4 mg total) by mouth every 8 (eight) hours as needed for nausea or vomiting. (Patient not taking: Reported on 08/17/2021) 20 tablet 0    Blood pressure 110/75, pulse 81, temperature 98.1 F (36.7 C), temperature source Oral, resp. rate (!) 22, height 5' 5"  (1.651 m), weight 56.7 kg, SpO2 100 %. Physical Exam: General: pleasant, WD, female who is laying in bed in NAD HEENT: head is normocephalic, atraumatic.  Sclera are noninjected.  Pupils equal and round. EOMs intact.  Ears and nose without any masses or lesions.  Mouth is pink and moist Heart: regular, rate, and rhythm.  Normal s1,s2. No obvious murmurs, gallops, or rubs noted.  Palpable  radial and pedal pulses bilaterally Lungs: CTAB, no wheezes, rhonchi, or rales noted.  Respiratory effort nonlabored Abd: soft, ND, no masses, hernias, or organomegaly. Hypoactive bowel sounds. Diffusely tender to palpation with severe tenderness across lower abdomen with voluntary guarding. No rebound MSK: all 4 extremities are symmetrical with no cyanosis, clubbing, or edema. Skin: warm and dry with no masses, lesions, or rashes Neuro: Cranial nerves 2-12 grossly intact, sensation is normal throughout Psych: A&Ox3 with an appropriate affect.    Results for orders placed or performed during the hospital encounter of 08/16/21 (from the past 48 hour(s))  Lipase, blood     Status: None   Collection Time: 08/16/21  3:54 PM  Result Value Ref Range   Lipase 28 11 - 51 U/L    Comment: Performed at [facility], [address] [place], [place] [address]  Comprehensive metabolic panel     Status: Abnormal   Collection Time: 08/16/21  3:54 PM  Result Value Ref Range   Sodium 123 (L) 135 - 145 mmol/L   Potassium 3.0 (L) 3.5 - 5.1 mmol/L   Chloride 85 (L) 98 - 111 mmol/L   CO2 21 (L) 22 - 32 mmol/L   Glucose, Bld 80 70 - 99 mg/dL    Comment: Glucose reference range applies only to samples taken after fasting for at least 8 hours.   BUN 26 (H) 6 - 20 mg/dL   Creatinine, Ser 0.89 0.44 - 1.00 mg/dL   Calcium 9.3 8.9 - 10.3 mg/dL   Total Protein 6.9 6.5 - 8.1 g/dL   Albumin 3.7 3.5 - 5.0 g/dL   AST 12 (L) 15 - 41 U/L   ALT 7 0 - 44 U/L   Alkaline Phosphatase 44 38 - 126 U/L   Total Bilirubin 0.5 0.3 - 1.2 mg/dL   GFR, Estimated >60 >60 mL/min    Comment: (NOTE) Calculated using the CKD-EPI Creatinine Equation (2021)    Anion gap 17 (H) 5 - 15    Comment: Performed at [facility], [address] [place], [place] [address]  CBC     Status: Abnormal   Collection Time: 08/16/21  3:54 PM  Result Value Ref Range   WBC 15.9 (H) 4.0 - 10.5 K/uL   RBC  3.70 (L) 3.87 - 5.11 MIL/uL   Hemoglobin 12.0 12.0 - 15.0 g/dL   HCT 34.3 (L) 36.0 - 46.0 %   MCV 92.7 80.0 - 100.0 fL   MCH 32.4 26.0 - 34.0 pg   MCHC 35.0 30.0 - 36.0 g/dL   RDW 11.5 11.5 - 15.5 %   Platelets 222 150 - 400 K/uL   nRBC 0.0 0.0 - 0.2 %    Comment: Performed at [facility], [address], [place], [place] [address]  Resp Panel by RT-PCR (Flu A&B, Covid) Nasopharyngeal Swab     Status: None   Collection Time: 08/16/21  4:00 PM   Specimen: Nasopharyngeal Swab; Nasopharyngeal(NP) swabs in vial transport medium  Result Value Ref Range   SARS Coronavirus 2 by RT PCR NEGATIVE NEGATIVE    Comment: (NOTE) SARS-CoV-2 target nucleic acids are NOT DETECTED.  The SARS-CoV-2 RNA is generally detectable in upper respiratory specimens during the acute phase of infection.  The lowest concentration of SARS-CoV-2 viral copies this assay can detect is 138 copies/mL. A negative result does not preclude SARS-Cov-2 infection and should not be used as the sole basis for treatment or other patient management decisions. A negative result may occur with  improper specimen collection/handling, submission of specimen other than nasopharyngeal swab, presence of viral mutation(s) within the areas targeted by this assay, and inadequate number of viral copies(<138 copies/mL). A negative result must be combined with clinical observations, patient history, and epidemiological information. The expected result is Negative.  Fact Sheet for Patients:  EntrepreneurPulse.com.au  Fact Sheet for Healthcare Providers:  IncredibleEmployment.be  This test is no t yet approved or cleared by the [place] FDA and  has been authorized for detection and/or diagnosis of SARS-CoV-2 by FDA under an Emergency Use Authorization (EUA). This EUA will remain  in effect (meaning this test can be used) for the duration of the COVID-19 declaration under  Section 564(b)(1) of the Act, 21 U.S.C.section 360bbb-3(b)(1), unless the authorization is terminated  or revoked sooner.       Influenza A by PCR NEGATIVE NEGATIVE   Influenza B by PCR NEGATIVE NEGATIVE    Comment: (NOTE) The Xpert Xpress SARS-CoV-2/FLU/RSV plus assay is intended as an aid in the diagnosis of influenza from Nasopharyngeal swab specimens and should not be used as a sole basis for treatment. Nasal washings and aspirates are unacceptable for Xpert Xpress SARS-CoV-2/FLU/RSV testing.  Fact Sheet for Patients: EntrepreneurPulse.com.au  Fact Sheet for Healthcare Providers: IncredibleEmployment.be  This test is not yet approved or cleared by the [place] FDA and has been authorized for detection and/or diagnosis of SARS-CoV-2 by FDA under an Emergency Use Authorization (EUA). This EUA will remain in effect (meaning this test can be used) for the duration of the COVID-19 declaration under Section 564(b)(1) of the Act, 21 U.S.C. section 360bbb-3(b)(1), unless the authorization is terminated or revoked.  Performed at [facility], [address] [place], [place] [address]   Urinalysis, Routine w reflex microscopic Urine, Clean Catch     Status: Abnormal   Collection Time: 08/16/21  4:30 PM  Result Value Ref Range   Color, Urine YELLOW YELLOW   APPearance CLEAR CLEAR   Specific Gravity, Urine 1.012 1.005 - 1.030   pH 6.0 5.0 - 8.0   Glucose, UA NEGATIVE NEGATIVE mg/dL   Hgb urine dipstick NEGATIVE NEGATIVE   Bilirubin Urine NEGATIVE NEGATIVE   Ketones, ur 15 (A) NEGATIVE mg/dL   Protein, ur 30 (A) NEGATIVE mg/dL   Nitrite NEGATIVE NEGATIVE   Leukocytes,Ua NEGATIVE NEGATIVE   RBC / HPF 0-5 0 - 5 RBC/hpf   WBC, UA 6-10 0 - 5 WBC/hpf   Bacteria, UA FEW (A) NONE SEEN   Squamous Epithelial / LPF 0-5 0 - 5   Mucus PRESENT    Hyaline Casts, UA PRESENT     Comment: Performed at [facility], [place], [place] [address]  Lactic acid, plasma     Status: None   Collection Time: 08/16/21  5:02 PM  Result Value Ref Range   Lactic Acid, Venous 0.8 0.5 - 1.9 mmol/L    Comment: Performed at [facility], [address], [place], [place] [address]  Blood culture (routine x 2)     Status: None (Preliminary result)   Collection Time: 08/16/21  6:22 PM   Specimen: BLOOD  Result Value Ref Range   Specimen Description      BLOOD BLOOD  LEFT FOREARM Performed at [facility], [address], [place], [place] [address]    Special Requests      BOTTLES DRAWN AEROBIC AND ANAEROBIC Blood Culture adequate volume Performed at [facility], [address] [place], [place] [address]    Culture      NO GROWTH < 12 HOURS Performed at [facility] [address] [place], [place] [address]    Report Status PENDING   Blood culture (routine x 2)     Status: None (Preliminary result)   Collection Time: 08/16/21  6:25 PM   Specimen: BLOOD LEFT ARM  Result Value Ref Range   Specimen Description      BLOOD LEFT ARM Performed at [facility], [address], [place], [place] [address]    Special Requests      BOTTLES DRAWN AEROBIC AND ANAEROBIC Blood Culture adequate volume Performed at [facility], [address], [place], [place] [address]    Culture      NO GROWTH < 12 HOURS Performed at [facility] [address], [place], [place] [address]    Report Status PENDING   Basic metabolic panel     Status: Abnormal   Collection Time: 08/16/21  6:40 PM  Result Value Ref Range   Sodium 126 (L) 135 - 145 mmol/L   Potassium 3.1 (L) 3.5 - 5.1 mmol/L   Chloride 90 (L) 98 - 111 mmol/L   CO2 20 (L) 22 - 32 mmol/L   Glucose, Bld 73 70 - 99 mg/dL    Comment: Glucose reference range applies only to samples taken after fasting for at least 8 hours.   BUN 23  (H) 6 - 20 mg/dL   Creatinine, Ser 0.63 0.44 - 1.00 mg/dL   Calcium 8.9 8.9 - 10.3 mg/dL   GFR, Estimated >60 >60 mL/min    Comment: (NOTE) Calculated using the CKD-EPI Creatinine Equation (2021)    Anion gap 16 (H) 5 - 15    Comment: Performed at [facility], [address] [place], [place] [address]  Lactic acid, plasma     Status: None   Collection Time: 08/16/21  7:02 PM  Result Value Ref Range   Lactic Acid, Venous 0.7 0.5 - 1.9 mmol/L    Comment: Performed at [facility], [address], [place], [place] [address]   CT ABDOMEN PELVIS W CONTRAST  Result Date: 08/16/2021 CLINICAL DATA:  Abdominal pain with shortness of breath. Worsening since Saturday. EXAM: CT ABDOMEN AND PELVIS WITH CONTRAST TECHNIQUE: Multidetector CT imaging of the abdomen and pelvis was performed using the standard protocol following bolus administration of intravenous contrast. RADIATION DOSE REDUCTION: This exam was performed according to the departmental dose-optimization program which includes automated exposure control, adjustment of the mA and/or kV according to patient size and/or use of iterative reconstruction technique. CONTRAST:  109m OMNIPAQUE IOHEXOL 300 MG/ML  SOLN COMPARISON:  None. FINDINGS: Lower chest: Mild dependent bibasilar atelectasis. Normal heart size without pericardial or pleural effusion. Tiny hiatal hernia. Hepatobiliary: Normal liver. Normal gallbladder, without biliary ductal dilatation. Pancreas: Normal, without mass or ductal dilatation. Spleen: Normal in size, without focal abnormality. Adrenals/Urinary Tract: Normal adrenal glands. Normal kidneys, without hydronephrosis. Normal urinary bladder. Stomach/Bowel: Normal remainder of the stomach. Anatomy is distorted, presumably secondary to subacute inflammation. The distal small bowel is inflamed. A moderately dilated appendix may be identified on coronal images 47 through 59. There is extensive  interloop fluid including on  54/2. Small bowel loops are mildly dilated, likely due to secondary adynamic ileus. Vascular/Lymphatic: Aortic atherosclerosis. No abdominopelvic adenopathy. Reproductive: Hysterectomy.  No adnexal mass. Other: Pelvic cul-de-sac fluid collection with peritoneal enhancement and thickening, including at 7.8 x 4.3 cm on 63/2. Musculoskeletal: Degenerate disc disease at L4-5. Transitional S1 vertebral body. IMPRESSION: 1. Constellation of findings, most consistent with an inflammatory lower abdominal and pelvic process, including developing cul-de-sac abscess. Due to anatomic distortion from presumed subacute inflammation, it is difficult to delineate if this process represents infectious or inflammatory enteritis versus appendicitis. Consider surgical consultation for probable pelvic cul-de-sac fluid collection drainage. A potential clinical strategies includes antibiotics and follow-up pelvic CT at 3-4 days. 2. Mild small bowel dilatation, suggesting secondary adynamic ileus. 3.  Aortic Atherosclerosis (ICD10-I70.0). 4.  Tiny hiatal hernia. These results will be called to the ordering clinician or representative by the Radiologist Assistant, and communication documented in the PACS or [employer]. Electronically Signed   By: Abigail Miyamoto M.D.   On: 08/16/2021 17:47      Assessment/Plan Pelvic abscess Infectious vs inflammatory enteritis vs appendicitis, possible diverticulitis vs crohn's Leukocytosis - CT scan showing: "1. Constellation of findings, most consistent with an inflammatory lower abdominal and pelvic process, including developing cul-de-sac abscess. Question if this represents infectious or inflammatory enteritis versus appendicitis. 2. Mild small bowel dilatation, suggesting secondary adynamic ileus." - WBC 15.9, afebrile - lactic acid normal - unsure of the etiology of her inflammation/abscess. Given imaging findings/location could be related to appendicitis  vs crohn's vs diverticulitis.  - No urgent/emergent surgery is necessary at this time and favor continued IV abx, possible drain, and repeat imaging. Monitor abdominal exam and labs and should she acutely worsen then operative intervention may become necessary - discussed this with patient. Continue to plan for colonoscopy outpatient once inflammation resolved pending acute course - will ask IR to eval for drain into abscess - continue IV abx and follow drain culture  FEN: npo pending IR consult, can have CLD from our standpoint ID: [email or web address] VTE: okay for chemical prophylaxis from surgical perspective  Lupus - on plaquenil hypothyroidism  I reviewed ED provider notes, last 24 h vitals and pain scores, last 48 h intake and output, last 24 h labs and trends, and last 24 h imaging results.  This care required high  level of medical decision making.   Winferd Humphrey, [facility] 08/17/2021, 1:17 PM Please see Amion for pager number during day hours 7:00am-4:30pm

## 2021-08-18 ENCOUNTER — Inpatient Hospital Stay (HOSPITAL_COMMUNITY): Payer: Medicare Other

## 2021-08-18 DIAGNOSIS — K529 Noninfective gastroenteritis and colitis, unspecified: Secondary | ICD-10-CM | POA: Diagnosis not present

## 2021-08-18 LAB — RENAL FUNCTION PANEL
Albumin: 2.6 g/dL — ABNORMAL LOW (ref 3.5–5.0)
Anion gap: 8 (ref 5–15)
BUN: 6 mg/dL (ref 6–20)
CO2: 22 mmol/L (ref 22–32)
Calcium: 8 mg/dL — ABNORMAL LOW (ref 8.9–10.3)
Chloride: 98 mmol/L (ref 98–111)
Creatinine, Ser: 0.55 mg/dL (ref 0.44–1.00)
GFR, Estimated: >60 mL/min (ref >60)
Glucose, Bld: 116 mg/dL — ABNORMAL HIGH (ref 70–99)
Phosphorus: 1.1 mg/dL — ABNORMAL LOW (ref 2.5–4.6)
Potassium: 2.9 mmol/L — ABNORMAL LOW (ref 3.5–5.1)
Sodium: 128 mmol/L — ABNORMAL LOW (ref 135–145)

## 2021-08-18 LAB — GLUCOSE, CAPILLARY
Glucose-Capillary: 114 mg/dL — ABNORMAL HIGH (ref 70–99)
Glucose-Capillary: 103 mg/dL — ABNORMAL HIGH (ref 70–99)
Glucose-Capillary: 85 mg/dL (ref 70–99)

## 2021-08-18 LAB — CBC
HCT: 30.1 % — ABNORMAL LOW (ref 36.0–46.0)
Hemoglobin: 10.4 g/dL — ABNORMAL LOW (ref 12.0–15.0)
MCH: 33.3 pg (ref 26.0–34.0)
MCHC: 34.6 g/dL (ref 30.0–36.0)
MCV: 96.5 fL (ref 80.0–100.0)
Platelets: 264 10*3/uL (ref 150–400)
RBC: 3.12 MIL/uL — ABNORMAL LOW (ref 3.87–5.11)
RDW: 12.3 % (ref 11.5–15.5)
WBC: 7.9 10*3/uL (ref 4.0–10.5)
nRBC: 0 % (ref 0.0–0.2)

## 2021-08-18 LAB — HIV ANTIBODY (ROUTINE TESTING W REFLEX): HIV Screen 4th Generation wRfx: NONREACTIVE

## 2021-08-18 LAB — PROTIME-INR
INR: 1.1 (ref 0.8–1.2)
Prothrombin Time: 14.1 seconds (ref 11.4–15.2)

## 2021-08-18 LAB — PREALBUMIN: Prealbumin: <5 mg/dL — ABNORMAL LOW (ref 18–38)

## 2021-08-18 LAB — PHOSPHORUS: Phosphorus: 1.1 mg/dL — ABNORMAL LOW (ref 2.5–4.6)

## 2021-08-18 LAB — MAGNESIUM: Magnesium: 2 mg/dL (ref 1.7–2.4)

## 2021-08-18 MED ORDER — SODIUM CHLORIDE 0.9% FLUSH
10.0000 mL | Freq: Two times a day (BID) | INTRAVENOUS | Status: DC
  Administered 2021-08-18 – 2021-08-29 (×14): 10 mL

## 2021-08-18 MED ORDER — K PHOS MONO-SOD PHOS DI & MONO 155-852-130 MG PO TABS
500.0000 mg | ORAL_TABLET | Freq: Four times a day (QID) | ORAL | Status: AC
  Administered 2021-08-18 – 2021-08-19 (×4): 500 mg via ORAL
  Filled 2021-08-18 (×5): qty 2

## 2021-08-18 MED ORDER — POTASSIUM CHLORIDE CRYS ER 20 MEQ PO TBCR
40.0000 meq | EXTENDED_RELEASE_TABLET | Freq: Once | ORAL | Status: AC
  Administered 2021-08-18: 40 meq via ORAL
  Filled 2021-08-18: qty 2

## 2021-08-18 MED ORDER — MIDAZOLAM HCL 2 MG/2ML IJ SOLN
INTRAMUSCULAR | Status: AC | PRN
  Administered 2021-08-18 (×3): 1 mg via INTRAVENOUS

## 2021-08-18 MED ORDER — POTASSIUM CHLORIDE CRYS ER 20 MEQ PO TBCR
20.0000 meq | EXTENDED_RELEASE_TABLET | Freq: Once | ORAL | Status: AC
  Administered 2021-08-18: 20 meq via ORAL
  Filled 2021-08-18: qty 1

## 2021-08-18 MED ORDER — MIDAZOLAM HCL 2 MG/2ML IJ SOLN
INTRAMUSCULAR | Status: AC
  Filled 2021-08-18: qty 4

## 2021-08-18 MED ORDER — ACETAMINOPHEN 500 MG PO TABS
1000.0000 mg | ORAL_TABLET | Freq: Four times a day (QID) | ORAL | Status: DC
  Administered 2021-08-18 – 2021-08-28 (×25): 1000 mg via ORAL
  Filled 2021-08-18 (×34): qty 2

## 2021-08-18 MED ORDER — LIDOCAINE-SODIUM BICARBONATE 1-8.4 % IJ SOSY
PREFILLED_SYRINGE | INTRAMUSCULAR | Status: AC | PRN
  Administered 2021-08-18: 10 mL via SUBCUTANEOUS

## 2021-08-18 MED ORDER — FENTANYL CITRATE (PF) 100 MCG/2ML IJ SOLN
INTRAMUSCULAR | Status: AC | PRN
  Administered 2021-08-18 (×3): 50 ug via INTRAVENOUS

## 2021-08-18 MED ORDER — METHOCARBAMOL 500 MG PO TABS
750.0000 mg | ORAL_TABLET | Freq: Four times a day (QID) | ORAL | Status: DC
  Administered 2021-08-18 – 2021-08-20 (×7): 750 mg via ORAL
  Filled 2021-08-18 (×7): qty 2

## 2021-08-18 MED ORDER — POTASSIUM CHLORIDE 10 MEQ/100ML IV SOLN: 10.0000 meq | INTRAVENOUS | Status: DC

## 2021-08-18 MED ORDER — FENTANYL CITRATE (PF) 100 MCG/2ML IJ SOLN
INTRAMUSCULAR | Status: AC
  Filled 2021-08-18: qty 4

## 2021-08-18 MED ORDER — FLUMAZENIL 0.5 MG/5ML IV SOLN
INTRAVENOUS | Status: AC
  Filled 2021-08-18: qty 5

## 2021-08-18 MED ORDER — NALOXONE HCL 0.4 MG/ML IJ SOLN
INTRAMUSCULAR | Status: AC
  Filled 2021-08-18: qty 1

## 2021-08-18 MED ORDER — POTASSIUM PHOSPHATES 15 MMOLE/5ML IV SOLN
30.0000 mmol | Freq: Once | INTRAVENOUS | Status: DC
  Filled 2021-08-18: qty 10

## 2021-08-18 NOTE — Progress Notes (Signed)
°  Transition of Care [facility] Screening Note   Patient Details  Name: Cherissa Hook Date of Birth: January 23, 1961   Transition of [facility]) CM/SW Contact:    Masiyah Jorstad, Marjie Skiff, RN Phone Number: 08/18/2021, 9:59 AM    Transition of Care Department [facility]) has reviewed patient and no TOC needs have been identified at this time. We will continue to monitor patient advancement through interdisciplinary progression rounds. If new patient transition needs arise, please place a TOC consult.

## 2021-08-18 NOTE — Progress Notes (Signed)
PROGRESS NOTE    Deanna Fischer  VZD:[id number] DOB: Nov 10, 1960 DOA: 08/16/2021 PCP: Patient, No Pcp Per (Inactive)     Brief Narrative:  No notes on file  Deanna Fischer is a 61 y.o. female with medical history significant of hypothyroidism, SLE, depression with anxiety, claustrophobia, PTSD, fibromyositis who is coming to the emergency department with complaints of abdominal pain since last week associated with multiple episodes of emesis, diarrhea and then constipation since Friday.  She had some melanotic stools earlier last week  Subjective: Patient is seen early in the morning  I have Reviewed nursing notes, Vitals, and Lab results since pt's last encounter. Pertinent lab results CBC, BMP,   She reported feeling better, denies pain  Assessment & Plan:  Principal Problem:   Enteritis Active Problems:   Hypothyroidism   Hyponatremia   Hypokalemia   Normocytic anemia   Depression with anxiety   Hypophosphatemia    Assessment and Plan: No notes have been filed under this hospital service. Service: Hospitalist    Pelvic abscess -Unclear source --CT-guided drain/catheter placement planned today -General surgery and interventional radiology consulted, will follow recommendation -Currently on Zosyn   Electrolyte abnormalities Hyponatremia/hypokalemia/hypophosphatemia Likely due to poor oral intake, replace Recheck in the morning  SLE Continue hydroxychloroquine Follows with [facility] rheumatology  Hypothyroidism Continue Synthroid supplement   : Body mass index is 20.8 kg/m..     I ordered the following labs:  Unresulted Labs (From admission, onward)     Start     Ordered   08/19/21 0500  CBC  Tomorrow morning,   R       Question:  Specimen collection method  Answer:  IV Team   08/18/21 1116   08/19/21 2951  Basic metabolic panel  Tomorrow morning,   R       Comments: If K < 3.5, give 11mq KCl in 10MEq runs per protocol.  Pharmacy may adjust dosing  strength, schedule, rate of infusion, etc as needed to optimize therapy   Question:  Specimen collection method  Answer:  IV Team   08/18/21 1116   08/18/21 1621  Aerobic/Anaerobic Culture w Gram Stain (surgical/deep wound)  Once,   R       Comments: Pelvic abscess    08/18/21 1620             DVT prophylaxis: SCDs Start: 08/17/21 1328   Code Status:   Code Status: Full Code  Family Communication: Patient Disposition:   Status is: Inpatient  Dispo: The patient is from: Home              Anticipated d/c is to: Home              Anticipated d/c date is: TBD  Antimicrobials:    Anti-infectives (From admission, onward)    Start     Dose/Rate Route Frequency Ordered Stop   08/17/21 1730  hydroxychloroquine (PLAQUENIL) tablet 200 mg        200 mg Oral Daily 08/17/21 1630     08/16/21 1815  piperacillin-tazobactam (ZOSYN) IVPB 3.375 g        3.375 g 12.5 mL/hr over 240 Minutes Intravenous Every 8 hours 08/16/21 1805            Objective: Vitals:   08/18/21 1615 08/18/21 1620 08/18/21 1659 08/18/21 1740  BP: 108/74 112/69 116/60 107/71  Pulse: 84 80 78 81  Resp: 12 12 16 16   Temp:   98.4 F (36.9 C) 98 F (36.7 C)  TempSrc:   Oral Oral  SpO2: 100% 100%  94%  Weight:      Height:        Intake/Output Summary (Last 24 hours) at 08/18/2021 2012 Last data filed at 08/18/2021 1710 Gross per 24 hour  Intake 1892.84 ml  Output 1120 ml  Net 772.84 ml   Filed Weights   08/16/21 1539  Weight: 56.7 kg    Examination:  General exam: alert, awake, communicative,calm, NAD Respiratory system: Clear to auscultation. Respiratory effort normal. Cardiovascular system:  RRR.  Gastrointestinal system: Abdomen is nondistended, soft and nontender.  Normal bowel sounds heard. Central nervous system: Alert and oriented. No focal neurological deficits. Extremities:  no edema Skin: No rashes, lesions or ulcers Psychiatry: Judgement and insight appear normal. Mood &  affect appropriate.     Data Reviewed: I have personally reviewed  labs and visualized  imaging studies since the last encounter and formulate the plan        Scheduled Meds:  acetaminophen  1,000 mg Oral Q6H   buPROPion  300 mg Oral Daily   escitalopram  10 mg Oral Daily   hydroxychloroquine  200 mg Oral Daily   levothyroxine  100 mcg Oral Q0600   methocarbamol  750 mg Oral Q6H   pantoprazole (PROTONIX) IV  40 mg Intravenous Q24H   phosphorus  500 mg Oral QID   sodium chloride flush  10 mL Intracatheter Q12H   Continuous Infusions:  0.9 % NaCl with KCl 20 mEq / L 75 mL/hr at 08/18/21 1710   piperacillin-tazobactam (ZOSYN)  IV 3.375 g (08/18/21 1815)     LOS: 1 day      Florencia Reasons, MD PhD FACP [facility]  Available via Epic secure chat 7am-7pm for nonurgent issues Please page for urgent issues To page the attending provider between 7A-7P or the covering provider during after hours 7P-7A, please log into the web site [email or web address] and access using universal [place] password for that web site. If you do not have the password, please call the hospital operator.    08/18/2021, 8:12 PM

## 2021-08-18 NOTE — Progress Notes (Signed)
Progress Note     Subjective: Abdominal pain stable currently - had some pain overnight/early am that improved with IV pain meds. Tolerated CLD yesterday. No nausea or emesis. No flatus. No BM since prior to admission   Objective: Vital signs in last 24 hours: Temp:  [98.1 F (36.7 C)-99.1 F (37.3 C)] 98.7 F (37.1 C) (02/15 0541) Pulse Rate:  [71-84] 84 (02/15 0541) Resp:  [14-22] 18 (02/15 0541) BP: (99-124)/(62-75) 107/68 (02/15 0541) SpO2:  [92 %-100 %] 95 % (02/15 0541)    Intake/Output from previous day: 02/14 0701 - 02/15 0700 In: 2612.1 [P.O.:440; I.V.:1919.3; IV Piggyback:252.8] Out: 1300 [Urine:1300] Intake/Output this shift: No intake/output data recorded.  PE: General: pleasant, WD, female who is laying in bed in NAD HEENT: head is normocephalic, atraumatic.  Mouth is pink and moist Heart: Palpable radial pulses bilaterally Lungs:  Respiratory effort nonlabored on room air Abd: soft, mild distension, moderate TTP diffusely with voluntary guarding - improved from exam yesterday, +BS MSK: all 4 extremities are symmetrical with no cyanosis, clubbing, or edema. Skin: warm and dry with no masses, lesions, or rashes Psych: A&Ox3 with an appropriate affect.    Lab Results:  Recent Labs    08/17/21 1353 08/18/21 0622  WBC 11.1* 7.9  HGB 11.2* 10.4*  HCT 32.0* 30.1*  PLT 266 264   BMET Recent Labs    08/17/21 1353 08/18/21 0622  NA 129* 128*  K 3.0* 2.9*  CL 97* 98  CO2 20* 22  GLUCOSE 81 116*  BUN 11 6  CREATININE 0.48 0.55  CALCIUM 8.6* 8.0*   PT/INR Recent Labs    08/18/21 0622  LABPROT 14.1  INR 1.1   CMP     Component Value Date/Time   NA 128 (L) 08/18/2021 0622   K 2.9 (L) 08/18/2021 0622   CL 98 08/18/2021 0622   CO2 22 08/18/2021 0622   GLUCOSE 116 (H) 08/18/2021 0622   BUN 6 08/18/2021 0622   CREATININE 0.55 08/18/2021 0622   CALCIUM 8.0 (L) 08/18/2021 0622   PROT 6.9 08/16/2021 1554   ALBUMIN 2.6 (L) 08/18/2021 0622    AST 12 (L) 08/16/2021 1554   ALT 7 08/16/2021 1554   ALKPHOS 44 08/16/2021 1554   BILITOT 0.5 08/16/2021 1554   GFRNONAA >60 08/18/2021 0622   Lipase     Component Value Date/Time   LIPASE 28 08/16/2021 1554       Studies/Results: CT ABDOMEN PELVIS W CONTRAST  Result Date: 08/16/2021 CLINICAL DATA:  Abdominal pain with shortness of breath. Worsening since Saturday. EXAM: CT ABDOMEN AND PELVIS WITH CONTRAST TECHNIQUE: Multidetector CT imaging of the abdomen and pelvis was performed using the standard protocol following bolus administration of intravenous contrast. RADIATION DOSE REDUCTION: This exam was performed according to the departmental dose-optimization program which includes automated exposure control, adjustment of the mA and/or kV according to patient size and/or use of iterative reconstruction technique. CONTRAST:  85m OMNIPAQUE IOHEXOL 300 MG/ML  SOLN COMPARISON:  None. FINDINGS: Lower chest: Mild dependent bibasilar atelectasis. Normal heart size without pericardial or pleural effusion. Tiny hiatal hernia. Hepatobiliary: Normal liver. Normal gallbladder, without biliary ductal dilatation. Pancreas: Normal, without mass or ductal dilatation. Spleen: Normal in size, without focal abnormality. Adrenals/Urinary Tract: Normal adrenal glands. Normal kidneys, without hydronephrosis. Normal urinary bladder. Stomach/Bowel: Normal remainder of the stomach. Anatomy is distorted, presumably secondary to subacute inflammation. The distal small bowel is inflamed. A moderately dilated appendix may be identified on coronal images 47 through  59. There is extensive interloop fluid including on 54/2. Small bowel loops are mildly dilated, likely due to secondary adynamic ileus. Vascular/Lymphatic: Aortic atherosclerosis. No abdominopelvic adenopathy. Reproductive: Hysterectomy.  No adnexal mass. Other: Pelvic cul-de-sac fluid collection with peritoneal enhancement and thickening, including at 7.8 x 4.3 cm  on 63/2. Musculoskeletal: Degenerate disc disease at L4-5. Transitional S1 vertebral body. IMPRESSION: 1. Constellation of findings, most consistent with an inflammatory lower abdominal and pelvic process, including developing cul-de-sac abscess. Due to anatomic distortion from presumed subacute inflammation, it is difficult to delineate if this process represents infectious or inflammatory enteritis versus appendicitis. Consider surgical consultation for probable pelvic cul-de-sac fluid collection drainage. A potential clinical strategies includes antibiotics and follow-up pelvic CT at 3-4 days. 2. Mild small bowel dilatation, suggesting secondary adynamic ileus. 3.  Aortic Atherosclerosis (ICD10-I70.0). 4.  Tiny hiatal hernia. These results will be called to the ordering clinician or representative by the Radiologist Assistant, and communication documented in the PACS or [employer]. Electronically Signed   By: Abigail Miyamoto M.D.   On: 08/16/2021 17:47    Anti-infectives: Anti-infectives (From admission, onward)    Start     Dose/Rate Route Frequency Ordered Stop   08/17/21 1730  hydroxychloroquine (PLAQUENIL) tablet 200 mg        200 mg Oral Daily 08/17/21 1630     08/16/21 1815  piperacillin-tazobactam (ZOSYN) IVPB 3.375 g        3.375 g 12.5 mL/hr over 240 Minutes Intravenous Every 8 hours 08/16/21 1805          Assessment/Plan Pelvic abscess Chronic peritonitis - Infectious vs inflammatory enteritis vs appendicitis, possible diverticulitis vs crohn's Leukocytosis - resolved - CT showing: "1. Constellation of findings, most consistent with an inflammatory lower abdominal and pelvic process, including developing cul-de-sac abscess. Question if this represents infectious or inflammatory enteritis versus appendicitis. 2. Mild small bowel dilatation, suggesting secondary adynamic ileus." - WBC normalized 7.9, afebrile - lactic acid normal 2/13 - No urgent/emergent surgery is necessary at  this time and favor continued IV abx, drain, and repeat imaging. Monitor abdominal exam and labs and should she acutely worsen then operative intervention may become necessary - discussed this with patient. Continue to plan for colonoscopy outpatient once inflammation resolved pending acute course - IR drain today - continue IV abx and follow drain culture   FEN: npo for drain, can have CLD from our standpoint after drain ID: [email or web address] 2/13 >> VTE: okay for chemical prophylaxis from surgical perspective   Lupus - on plaquenil hypothyroidism  I reviewed Consultant (IR) notes, hospitalist notes, last 24 h vitals and pain scores, last 48 h intake and output, and last 24 h labs and trends.  This care required moderate level of medical decision making.    LOS: 1 day   [facility] 08/18/2021, 7:44 AM Please see Amion for pager number during day hours 7:00am-4:30pm

## 2021-08-18 NOTE — Procedures (Signed)
Vascular and Interventional Radiology Procedure Note  Patient: Deanna Fischer DOB: April 29, 1961 Medical Record Number: [id number] Note Date/Time: 08/18/21 4:20 PM   Performing Physician: Michaelle Birks, MD Assistant(s): None  Diagnosis: Pelvic abscess  Procedure:  TRANSGLUTEAL DRAINAGE CATHETER PLACEMENT OF PELVIC ABSCESS  Anesthesia: Conscious Sedation Complications: None Estimated Blood Loss: Minimal Specimens: Sent for Gram Stain, Aerobe Culture, and Anerobe Culture  Findings:  Successful CT-guided R transgluteal-approach placement of 12 F catheter into pelvic abscess.  Plan:  - Flush drain with 10 mL Normal Saline every 12 hours. - Follow up drain evaluation / sinogram in 2 week(s).  See detailed procedure note with images in PACS. The patient tolerated the procedure well without incident or complication and was returned to Floor Bed in stable condition.    Michaelle Birks, MD [facility] Radiology   Pager. [place]

## 2021-08-19 ENCOUNTER — Encounter (HOSPITAL_COMMUNITY): Payer: Self-pay | Admitting: Internal Medicine

## 2021-08-19 DIAGNOSIS — K529 Noninfective gastroenteritis and colitis, unspecified: Secondary | ICD-10-CM | POA: Diagnosis not present

## 2021-08-19 LAB — BASIC METABOLIC PANEL
Anion gap: 7 (ref 5–15)
BUN: <5 mg/dL — ABNORMAL LOW (ref 6–20)
CO2: 26 mmol/L (ref 22–32)
Calcium: 8.4 mg/dL — ABNORMAL LOW (ref 8.9–10.3)
Chloride: 99 mmol/L (ref 98–111)
Creatinine, Ser: 0.45 mg/dL (ref 0.44–1.00)
GFR, Estimated: >60 mL/min (ref >60)
Glucose, Bld: 109 mg/dL — ABNORMAL HIGH (ref 70–99)
Potassium: 3.5 mmol/L (ref 3.5–5.1)
Sodium: 132 mmol/L — ABNORMAL LOW (ref 135–145)

## 2021-08-19 LAB — GLUCOSE, CAPILLARY
Glucose-Capillary: 109 mg/dL — ABNORMAL HIGH (ref 70–99)
Glucose-Capillary: 110 mg/dL — ABNORMAL HIGH (ref 70–99)
Glucose-Capillary: 134 mg/dL — ABNORMAL HIGH (ref 70–99)
Glucose-Capillary: 96 mg/dL (ref 70–99)
Glucose-Capillary: 97 mg/dL (ref 70–99)

## 2021-08-19 LAB — CBC
HCT: 32.1 % — ABNORMAL LOW (ref 36.0–46.0)
Hemoglobin: 10.9 g/dL — ABNORMAL LOW (ref 12.0–15.0)
MCH: 33.5 pg (ref 26.0–34.0)
MCHC: 34 g/dL (ref 30.0–36.0)
MCV: 98.8 fL (ref 80.0–100.0)
Platelets: 322 10*3/uL (ref 150–400)
RBC: 3.25 MIL/uL — ABNORMAL LOW (ref 3.87–5.11)
RDW: 12.6 % (ref 11.5–15.5)
WBC: 12.5 10*3/uL — ABNORMAL HIGH (ref 4.0–10.5)
nRBC: 0 % (ref 0.0–0.2)

## 2021-08-19 LAB — PHOSPHORUS: Phosphorus: 2.1 mg/dL — ABNORMAL LOW (ref 2.5–4.6)

## 2021-08-19 LAB — MAGNESIUM: Magnesium: 1.9 mg/dL (ref 1.7–2.4)

## 2021-08-19 MED ORDER — MORPHINE SULFATE (PF) 2 MG/ML IV SOLN
2.0000 mg | INTRAVENOUS | Status: DC | PRN
  Administered 2021-08-19: 4 mg via INTRAVENOUS
  Administered 2021-08-20: 2 mg via INTRAVENOUS
  Filled 2021-08-19 (×2): qty 2

## 2021-08-19 MED ORDER — DOCUSATE SODIUM 100 MG PO CAPS
100.0000 mg | ORAL_CAPSULE | Freq: Every day | ORAL | Status: DC
  Administered 2021-08-19 – 2021-08-20 (×2): 100 mg via ORAL
  Filled 2021-08-19 (×2): qty 1

## 2021-08-19 MED ORDER — OXYCODONE HCL 5 MG PO TABS
5.0000 mg | ORAL_TABLET | ORAL | Status: DC | PRN
  Administered 2021-08-19 – 2021-08-20 (×2): 5 mg via ORAL
  Filled 2021-08-19 (×2): qty 1

## 2021-08-19 NOTE — Progress Notes (Addendum)
Progress Note     Subjective: Drain placement went well yesterday. Abdominal pain remains improved. Tolerating clears but no flatus or BM. No nausea or emesis   Objective: Vital signs in last 24 hours: Temp:  [98 F (36.7 C)-100 F (37.8 C)] 98.7 F (37.1 C) (02/16 0620) Pulse Rate:  [77-90] 90 (02/16 0620) Resp:  [9-20] 16 (02/16 0620) BP: (106-128)/(60-84) 107/75 (02/16 0620) SpO2:  [91 %-100 %] 91 % (02/16 0620) Last BM Date : 08/16/21  Intake/Output from previous day: 02/15 0701 - 02/16 0700 In: 2088.8 [P.O.:10; I.V.:1945.3; IV Piggyback:133.5] Out: 520 [Urine:320; Drains:200] Intake/Output this shift: No intake/output data recorded.  PE: General: pleasant, WD, female who is laying in bed in NAD HEENT: head is normocephalic, atraumatic.  Mouth is pink and moist Heart: Palpable radial pulses bilaterally Lungs:  Respiratory effort nonlabored on room air Abd: soft, mild distension, mild TTP diffusely with voluntary guarding - improved from exam yesterday, +BS Transgluteal drain with cloudy yellow output MSK: all 4 extremities are symmetrical with no cyanosis, clubbing, or edema. Skin: warm and dry with no masses, lesions, or rashes Psych: A&Ox3 with an appropriate affect.    Lab Results:  Recent Labs    08/18/21 0622 08/19/21 0522  WBC 7.9 12.5*  HGB 10.4* 10.9*  HCT 30.1* 32.1*  PLT 264 322    BMET Recent Labs    08/18/21 0622 08/19/21 0522  NA 128* 132*  K 2.9* 3.5  CL 98 99  CO2 22 26  GLUCOSE 116* 109*  BUN 6 <5*  CREATININE 0.55 0.45  CALCIUM 8.0* 8.4*    PT/INR Recent Labs    08/18/21 0622  LABPROT 14.1  INR 1.1    CMP     Component Value Date/Time   NA 132 (L) 08/19/2021 0522   K 3.5 08/19/2021 0522   CL 99 08/19/2021 0522   CO2 26 08/19/2021 0522   GLUCOSE 109 (H) 08/19/2021 0522   BUN <5 (L) 08/19/2021 0522   CREATININE 0.45 08/19/2021 0522   CALCIUM 8.4 (L) 08/19/2021 0522   PROT 6.9 08/16/2021 1554   ALBUMIN 2.6 (L)  08/18/2021 0622   AST 12 (L) 08/16/2021 1554   ALT 7 08/16/2021 1554   ALKPHOS 44 08/16/2021 1554   BILITOT 0.5 08/16/2021 1554   GFRNONAA >60 08/19/2021 0522   Lipase     Component Value Date/Time   LIPASE 28 08/16/2021 1554       Studies/Results: CT IMAGE GUIDED DRAINAGE BY PERCUTANEOUS CATHETER  Result Date: 08/18/2021 INDICATION: Pelvic abscess EXAM: Procedures: CT-GUIDED RIGHT TRANSGLUTEAL APPROACH PELVIC DRAINAGE CATHETER PLACEMENT RADIATION DOSE REDUCTION: This exam was performed according to the departmental dose-optimization program which includes automated exposure control, adjustment of the mA and/or kV according to patient size and/or use of iterative reconstruction technique. COMPARISON:  CT AP, 08/16/2021 MEDICATIONS: The patient is currently admitted to the hospital and receiving intravenous antibiotics. The antibiotics were administered within an appropriate time frame prior to the initiation of the procedure. ANESTHESIA/SEDATION: Moderate (conscious) sedation was employed during this procedure. A total of Versed 3 mg and Fentanyl 150 mcg was administered intravenously. Moderate Sedation Time: 28 minutes. The patient's level of consciousness and vital signs were monitored continuously by radiology nursing throughout the procedure under my direct supervision. CONTRAST:  None COMPLICATIONS: None immediate. PROCEDURE: Informed written consent was obtained from the patient and/or patient's representative after a discussion of the risks, benefits and alternatives to treatment. The patient was placed prone on the CT gantry  and a pre procedural CT was performed re-demonstrating the known abscess/fluid collection within the pelvis. The procedure was planned. A timeout was performed prior to the initiation of the procedure. The RIGHT gluteal soft tissues were prepped and draped in the usual sterile fashion. The overlying soft tissues were anesthetized with 1% lidocaine with epinephrine.  Appropriate trajectory was planned with the use of a 22 gauge spinal needle. An 18 gauge trocar needle was advanced into the abscess/fluid collection and a short Amplatz super stiff wire was coiled within the collection. Appropriate positioning was confirmed with a limited CT scan. The tract was serially dilated allowing placement of a 12 [place] all-purpose drainage catheter. Appropriate positioning was confirmed with a limited postprocedural CT scan. 15 ml of purulent fluid was aspirated. The tube was connected to a bulb suction and sutured in place. A dressing was placed. The patient tolerated the procedure well without immediate post procedural complication. IMPRESSION: Successful CT guided placement of a 12 Fr drainage catheter into the pelvic abscess via a RIGHT transgluteal approach, as above. Samples were sent to the laboratory as requested by the ordering clinical team. Michaelle Birks, MD Vascular and Interventional Radiology Specialists [facility] Radiology Electronically Signed   By: Michaelle Birks M.D.   On: 08/18/2021 17:15    Anti-infectives: Anti-infectives (From admission, onward)    Start     Dose/Rate Route Frequency Ordered Stop   08/17/21 1730  hydroxychloroquine (PLAQUENIL) tablet 200 mg        200 mg Oral Daily 08/17/21 1630     08/16/21 1815  piperacillin-tazobactam (ZOSYN) IVPB 3.375 g        3.375 g 12.5 mL/hr over 240 Minutes Intravenous Every 8 hours 08/16/21 1805          Assessment/Plan Pelvic abscess Chronic peritonitis - Infectious vs inflammatory enteritis vs appendicitis, possible diverticulitis vs crohn's Leukocytosis  - CT showing: "1. Constellation of findings, most consistent with an inflammatory lower abdominal and pelvic process, including developing cul-de-sac abscess. Question if this represents infectious or inflammatory enteritis versus appendicitis. 2. Mild small bowel dilatation, suggesting secondary adynamic ileus." - WBC 12.5 (7.9), afebrile - lactic  acid normal 2/13 - IR transgluteal drain 2/15 - 15 ml purulent fluid aspirated with placement. 200 ml out last 24 h - continue IV abx and follow drain culture - gram stain with gram neg rods, gram pos cocci in pairs, gram pos rods - No urgent/emergent surgery recommended at this time and favor continued IV abx, drain, and repeat imaging. Monitor abdominal exam and labs and should she acutely worsen then operative intervention may become acutely necessary. Continue to plan for colonoscopy outpatient once inflammation resolved pending acute course   FEN: CLD - AROBF before advancing ID: zosyn 2/13 >> VTE: okay for chemical prophylaxis from surgical perspective   Lupus - on plaquenil hypothyroidism  I reviewed Consultant (IR) notes, hospitalist notes, last 24 h vitals and pain scores, last 48 h intake and output, and last 24 h labs and trends.  This care required moderate level of medical decision making.    LOS: 2 days   [facility] 08/19/2021, 7:57 AM Please see Amion for pager number during day hours 7:00am-4:30pm

## 2021-08-19 NOTE — Progress Notes (Signed)
PROGRESS NOTE    Deanna Fischer  [facility]:[id number] DOB: [birth year] DOA: 08/16/2021 PCP: Patient, No Pcp Per (Inactive)     Brief Narrative:  No notes on file  Deanna Fischer is a 61 y.o. female with medical history significant of hypothyroidism, SLE, depression with anxiety, claustrophobia, PTSD, fibromyositis who is coming to the emergency department with complaints of abdominal pain since last week associated with multiple episodes of emesis, diarrhea and then constipation since Friday.  She had some melanotic stools earlier last week  Subjective: Patient is seen early in the morning  I have Reviewed nursing notes, Vitals, and Lab results since pt's last encounter. Pertinent lab results CBC, BMP,   She reported feeling better, denies pain, she desires to eat more Drain in place  Assessment & Plan:  Principal Problem:   Enteritis Active Problems:   Hypothyroidism   Hyponatremia   Hypokalemia   Normocytic anemia   Depression with anxiety   Hypophosphatemia    Assessment and Plan: No notes have been filed under this hospital service. Service: Hospitalist    Pelvic abscess -Unclear source --s/p CT-guided drain/catheter placement on 2/15 -General surgery and interventional radiology consulted, will follow recommendation -Currently on Zosyn   Electrolyte abnormalities Hyponatremia/hypokalemia/hypophosphatemia Likely due to poor oral intake, replace Recheck in the morning  SLE Continue hydroxychloroquine Follows with [facility] rheumatology  Hypothyroidism Continue Synthroid supplement   : Body mass index is 20.8 kg/m..     I ordered the following labs:  Unresulted Labs (From admission, onward)    None        DVT prophylaxis: Place and maintain sequential compression device Start: 08/19/21 0801 SCDs Start: 08/17/21 1328   Code Status:   Code Status: Full Code  Family Communication: Patient Disposition:   Status is: Inpatient  Dispo: The patient  is from: Home              Anticipated d/c is to: Home              Anticipated d/c date is: TBD  Antimicrobials:    Anti-infectives (From admission, onward)    Start     Dose/Rate Route Frequency Ordered Stop   08/17/21 1730  hydroxychloroquine (PLAQUENIL) tablet 200 mg        200 mg Oral Daily 08/17/21 1630     08/16/21 1815  piperacillin-tazobactam (ZOSYN) IVPB 3.375 g        3.375 g 12.5 mL/hr over 240 Minutes Intravenous Every 8 hours 08/16/21 1805            Objective: Vitals:   08/18/21 1740 08/18/21 2021 08/19/21 0620 08/19/21 1539  BP: 107/71 107/67 107/75 112/71  Pulse: 81 84 90 78  Resp: 16 18 16 16   Temp: 98 F (36.7 C) 100 F (37.8 C) 98.7 F (37.1 C) 98.6 F (37 C)  TempSrc: Oral Oral Oral Oral  SpO2: 94% 95% 91% 91%  Weight:      Height:        Intake/Output Summary (Last 24 hours) at 08/19/2021 1910 Last data filed at 08/19/2021 1330 Gross per 24 hour  Intake 1320.02 ml  Output --  Net 1320.02 ml   Filed Weights   08/16/21 1539  Weight: 56.7 kg    Examination:  General exam: alert, awake, communicative,calm, NAD Respiratory system: Clear to auscultation. Respiratory effort normal. Cardiovascular system:  RRR.  Gastrointestinal system: Abdomen is nondistended, soft and nontender.  Normal bowel sounds heard. + drain Central nervous system: Alert and oriented. No focal  neurological deficits. Extremities:  no edema Skin: No rashes, lesions or ulcers Psychiatry: Judgement and insight appear normal. Mood & affect appropriate.     Data Reviewed: I have personally reviewed  labs and visualized  imaging studies since the last encounter and formulate the plan        Scheduled Meds:  acetaminophen  1,000 mg Oral Q6H   buPROPion  300 mg Oral Daily   docusate sodium  100 mg Oral Daily   escitalopram  10 mg Oral Daily   hydroxychloroquine  200 mg Oral Daily   levothyroxine  100 mcg Oral Q0600   methocarbamol  750 mg Oral Q6H   pantoprazole  (PROTONIX) IV  40 mg Intravenous Q24H   sodium chloride flush  10 mL Intracatheter Q12H   Continuous Infusions:  0.9 % NaCl with KCl 20 mEq / L 75 mL/hr at 08/19/21 0619   piperacillin-tazobactam (ZOSYN)  IV 3.375 g (08/19/21 1216)     LOS: 2 days      Florencia Reasons, MD PhD FACP [facility]  Available via Epic secure chat 7am-7pm for nonurgent issues Please page for urgent issues To page the attending provider between 7A-7P or the covering provider during after hours 7P-7A, please log into the web site [email or web address] and access using universal [place] password for that web site. If you do not have the password, please call the hospital operator.    08/19/2021, 7:10 PM

## 2021-08-19 NOTE — Progress Notes (Signed)
Referring Physician(s): Dr. Michael Boston  Supervising Physician: Ruthann Cancer  Patient Status:  [facility] - In-pt  Chief Complaint: Pelvic abscess  Subjective: Patient resting comfortably in bed.  States she does not feel significant change/improvement since drain placement with the exception of less pelvic pressure. Afebrile today.  No complaints related to drain.   Allergies: Patient has no known allergies.  Medications: Prior to Admission medications   Medication Sig Start Date End Date Taking? Authorizing Provider  buPROPion (WELLBUTRIN XL) 300 MG 24 hr tablet Take 300 mg by mouth daily. 07/29/21  Yes [provider]  escitalopram (LEXAPRO) 10 MG tablet Take 10 mg by mouth daily. 07/17/21  Yes [provider]  hydroxychloroquine (PLAQUENIL) 200 MG tablet Take 200 mg by mouth daily.   Yes [provider]  ibuprofen (ADVIL) 200 MG tablet Take 200 mg by mouth every 6 (six) hours as needed.   Yes [provider]  levothyroxine (SYNTHROID, LEVOTHROID) 100 MCG tablet Take 100 mcg by mouth daily before breakfast.   Yes [provider]  ondansetron (ZOFRAN ODT) 4 MG disintegrating tablet Take 1 tablet (4 mg total) by mouth every 8 (eight) hours as needed for nausea or vomiting. Patient not taking: Reported on 08/17/2021 02/15/21   Claud Kelp, MD     Vital Signs: BP 107/75 (BP Location: Right Arm)    Pulse 90    Temp 98.7 F (37.1 C) (Oral)    Resp 16    Ht 5\' 5"  (1.651 m)    Wt 125 lb (56.7 kg)    SpO2 91%    BMI 20.80 kg/m   Physical Exam NAD, alert Abdomen: soft, non-distended.  R TG pelvic drain in place. Insertion site c/d/I.  Output 200 mL overnight. Thin, serous drainage in bulb.  Able to aspirate thicker purulent material with flushing.   Imaging: CT ABDOMEN PELVIS W CONTRAST  Result Date: 08/16/2021 CLINICAL DATA:  Abdominal pain with shortness of breath. Worsening since Saturday. EXAM: CT ABDOMEN AND PELVIS WITH CONTRAST  TECHNIQUE: Multidetector CT imaging of the abdomen and pelvis was performed using the standard protocol following bolus administration of intravenous contrast. RADIATION DOSE REDUCTION: This exam was performed according to the departmental dose-optimization program which includes automated exposure control, adjustment of the mA and/or kV according to patient size and/or use of iterative reconstruction technique. CONTRAST:  64mL OMNIPAQUE IOHEXOL 300 MG/ML  SOLN COMPARISON:  None. FINDINGS: Lower chest: Mild dependent bibasilar atelectasis. Normal heart size without pericardial or pleural effusion. Tiny hiatal hernia. Hepatobiliary: Normal liver. Normal gallbladder, without biliary ductal dilatation. Pancreas: Normal, without mass or ductal dilatation. Spleen: Normal in size, without focal abnormality. Adrenals/Urinary Tract: Normal adrenal glands. Normal kidneys, without hydronephrosis. Normal urinary bladder. Stomach/Bowel: Normal remainder of the stomach. Anatomy is distorted, presumably secondary to subacute inflammation. The distal small bowel is inflamed. A moderately dilated appendix may be identified on coronal images 47 through 59. There is extensive interloop fluid including on 54/2. Small bowel loops are mildly dilated, likely due to secondary adynamic ileus. Vascular/Lymphatic: Aortic atherosclerosis. No abdominopelvic adenopathy. Reproductive: Hysterectomy.  No adnexal mass. Other: Pelvic cul-de-sac fluid collection with peritoneal enhancement and thickening, including at 7.8 x 4.3 cm on 63/2. Musculoskeletal: Degenerate disc disease at L4-5. Transitional S1 vertebral body. IMPRESSION: 1. Constellation of findings, most consistent with an inflammatory lower abdominal and pelvic process, including developing cul-de-sac abscess. Due to anatomic distortion from presumed subacute inflammation, it is difficult to delineate if this process represents infectious or  inflammatory enteritis versus appendicitis.  Consider surgical consultation for probable pelvic cul-de-sac fluid collection drainage. A potential clinical strategies includes antibiotics and follow-up pelvic CT at 3-4 days. 2. Mild small bowel dilatation, suggesting secondary adynamic ileus. 3.  Aortic Atherosclerosis (ICD10-I70.0). 4.  Tiny hiatal hernia. These results will be called to the ordering clinician or representative by the Radiologist Assistant, and communication documented in the PACS or [employer]. Electronically Signed   By: Abigail Miyamoto M.D.   On: 08/16/2021 17:47   CT IMAGE GUIDED DRAINAGE BY PERCUTANEOUS CATHETER  Result Date: 08/18/2021 INDICATION: Pelvic abscess EXAM: Procedures: CT-GUIDED RIGHT TRANSGLUTEAL APPROACH PELVIC DRAINAGE CATHETER PLACEMENT RADIATION DOSE REDUCTION: This exam was performed according to the departmental dose-optimization program which includes automated exposure control, adjustment of the mA and/or kV according to patient size and/or use of iterative reconstruction technique. COMPARISON:  CT AP, 08/16/2021 MEDICATIONS: The patient is currently admitted to the hospital and receiving intravenous antibiotics. The antibiotics were administered within an appropriate time frame prior to the initiation of the procedure. ANESTHESIA/SEDATION: Moderate (conscious) sedation was employed during this procedure. A total of Versed 3 mg and Fentanyl 150 mcg was administered intravenously. Moderate Sedation Time: 28 minutes. The patient's level of consciousness and vital signs were monitored continuously by radiology nursing throughout the procedure under my direct supervision. CONTRAST:  None COMPLICATIONS: None immediate. PROCEDURE: Informed written consent was obtained from the patient and/or patient's representative after a discussion of the risks, benefits and alternatives to treatment. The patient was placed prone on the CT gantry and a pre procedural CT was performed re-demonstrating the known abscess/fluid  collection within the pelvis. The procedure was planned. A timeout was performed prior to the initiation of the procedure. The RIGHT gluteal soft tissues were prepped and draped in the usual sterile fashion. The overlying soft tissues were anesthetized with 1% lidocaine with epinephrine. Appropriate trajectory was planned with the use of a 22 gauge spinal needle. An 18 gauge trocar needle was advanced into the abscess/fluid collection and a short Amplatz super stiff wire was coiled within the collection. Appropriate positioning was confirmed with a limited CT scan. The tract was serially dilated allowing placement of a 12 [place] all-purpose drainage catheter. Appropriate positioning was confirmed with a limited postprocedural CT scan. 15 ml of purulent fluid was aspirated. The tube was connected to a bulb suction and sutured in place. A dressing was placed. The patient tolerated the procedure well without immediate post procedural complication. IMPRESSION: Successful CT guided placement of a 12 Fr drainage catheter into the pelvic abscess via a RIGHT transgluteal approach, as above. Samples were sent to the laboratory as requested by the ordering clinical team. Michaelle Birks, MD [facility] Radiology Electronically Signed   By: Michaelle Birks M.D.   On: 08/18/2021 17:15    Labs:  CBC: Recent Labs    08/16/21 1554 08/17/21 1353 08/18/21 0622 08/19/21 0522  WBC 15.9* 11.1* 7.9 12.5*  HGB 12.0 11.2* 10.4* 10.9*  HCT 34.3* 32.0* 30.1* 32.1*  PLT 222 266 264 322    COAGS: Recent Labs    08/18/21 0622  INR 1.1    BMP: Recent Labs    08/16/21 1840 08/17/21 1353 08/18/21 0622 08/19/21 0522  NA 126* 129* 128* 132*  K 3.1* 3.0* 2.9* 3.5  CL 90* 97* 98 99  CO2 20* 20* 22 26  GLUCOSE 73 81 116* 109*  BUN 23* 11 6 <5*  CALCIUM 8.9 8.6* 8.0* 8.4*  CREATININE 0.63 0.48 0.55 0.45  GFRNONAA >60 >60 >60 >60    LIVER FUNCTION TESTS: Recent Labs     08/16/21 1554 08/17/21 1353 08/18/21 0622  BILITOT 0.5  --   --   AST 12*  --   --   ALT 7  --   --   ALKPHOS 44  --   --   PROT 6.9  --   --   ALBUMIN 3.7 2.9* 2.6*    Assessment and Plan: Pelvic abscess s/p R TG drain placement 08/18/21 by Dr. Donah Driver in place today. Insertion site intact.  Thin, serous-appearing fluid in bulb, however thick purulent material aspirated with flushing.  RN at bedside.  Needs frequent flushing.  Culture pending; positive for GNR.  Trend output, record daily.  IR following.   Electronically Signed: Docia Barrier, PA 08/19/2021, 2:10 PM   I spent a total of 15 Minutes at the the patient's bedside AND on the patient's hospital floor or unit, greater than 50% of which was counseling/coordinating care for pelvic abscess.

## 2021-08-20 ENCOUNTER — Inpatient Hospital Stay (HOSPITAL_COMMUNITY): Payer: Medicare Other

## 2021-08-20 DIAGNOSIS — K529 Noninfective gastroenteritis and colitis, unspecified: Secondary | ICD-10-CM | POA: Diagnosis not present

## 2021-08-20 LAB — CBC WITH DIFFERENTIAL/PLATELET
Abs Immature Granulocytes: 0.37 10*3/uL — ABNORMAL HIGH (ref 0.00–0.07)
Basophils Absolute: 0.1 10*3/uL (ref 0.0–0.1)
Basophils Relative: 0 %
Eosinophils Absolute: 0.1 10*3/uL (ref 0.0–0.5)
Eosinophils Relative: 1 %
HCT: 31.7 % — ABNORMAL LOW (ref 36.0–46.0)
Hemoglobin: 10.9 g/dL — ABNORMAL LOW (ref 12.0–15.0)
Immature Granulocytes: 3 %
Lymphocytes Relative: 8 %
Lymphs Abs: 1.2 10*3/uL (ref 0.7–4.0)
MCH: 32.9 pg (ref 26.0–34.0)
MCHC: 34.4 g/dL (ref 30.0–36.0)
MCV: 95.8 fL (ref 80.0–100.0)
Monocytes Absolute: 1.1 10*3/uL — ABNORMAL HIGH (ref 0.1–1.0)
Monocytes Relative: 7 %
Neutro Abs: 11.8 10*3/uL — ABNORMAL HIGH (ref 1.7–7.7)
Neutrophils Relative %: 81 %
Platelets: 375 10*3/uL (ref 150–400)
RBC: 3.31 MIL/uL — ABNORMAL LOW (ref 3.87–5.11)
RDW: 12.5 % (ref 11.5–15.5)
WBC: 14.6 10*3/uL — ABNORMAL HIGH (ref 4.0–10.5)
nRBC: 0 % (ref 0.0–0.2)

## 2021-08-20 LAB — BASIC METABOLIC PANEL
Anion gap: 8 (ref 5–15)
BUN: <5 mg/dL — ABNORMAL LOW (ref 6–20)
CO2: 27 mmol/L (ref 22–32)
Calcium: 8.4 mg/dL — ABNORMAL LOW (ref 8.9–10.3)
Chloride: 97 mmol/L — ABNORMAL LOW (ref 98–111)
Creatinine, Ser: 0.45 mg/dL (ref 0.44–1.00)
GFR, Estimated: >60 mL/min (ref >60)
Glucose, Bld: 105 mg/dL — ABNORMAL HIGH (ref 70–99)
Potassium: 2.9 mmol/L — ABNORMAL LOW (ref 3.5–5.1)
Sodium: 132 mmol/L — ABNORMAL LOW (ref 135–145)

## 2021-08-20 LAB — GLUCOSE, CAPILLARY
Glucose-Capillary: 103 mg/dL — ABNORMAL HIGH (ref 70–99)
Glucose-Capillary: 102 mg/dL — ABNORMAL HIGH (ref 70–99)
Glucose-Capillary: 93 mg/dL (ref 70–99)

## 2021-08-20 LAB — PHOSPHORUS: Phosphorus: 3.4 mg/dL (ref 2.5–4.6)

## 2021-08-20 LAB — MAGNESIUM: Magnesium: 1.8 mg/dL (ref 1.7–2.4)

## 2021-08-20 MED ORDER — SODIUM CHLORIDE 0.9 % IV SOLN
200.0000 mg | Freq: Once | INTRAVENOUS | Status: AC
  Administered 2021-08-20: 200 mg via INTRAVENOUS
  Filled 2021-08-20: qty 200

## 2021-08-20 MED ORDER — SODIUM CHLORIDE 0.9 % IV SOLN: INTRAVENOUS | Status: DC | PRN

## 2021-08-20 MED ORDER — POTASSIUM CHLORIDE 10 MEQ/100ML IV SOLN
10.0000 meq | Freq: Once | INTRAVENOUS | Status: AC
  Administered 2021-08-21: 10 meq via INTRAVENOUS
  Filled 2021-08-20: qty 100

## 2021-08-20 MED ORDER — POTASSIUM CHLORIDE 10 MEQ/100ML IV SOLN
10.0000 meq | Freq: Once | INTRAVENOUS | Status: AC
  Administered 2021-08-20: 10 meq via INTRAVENOUS
  Filled 2021-08-20: qty 100

## 2021-08-20 MED ORDER — POTASSIUM CHLORIDE 10 MEQ/100ML IV SOLN
10.0000 meq | INTRAVENOUS | Status: AC
  Administered 2021-08-20 (×2): 10 meq via INTRAVENOUS
  Filled 2021-08-20 (×2): qty 100

## 2021-08-20 MED ORDER — ACETAMINOPHEN 650 MG RE SUPP: 650.0000 mg | Freq: Four times a day (QID) | RECTAL | Status: DC | PRN

## 2021-08-20 MED ORDER — ENSURE ENLIVE PO LIQD
237.0000 mL | Freq: Two times a day (BID) | ORAL | Status: DC
  Administered 2021-08-20: 237 mL via ORAL

## 2021-08-20 MED ORDER — POTASSIUM CHLORIDE 10 MEQ/100ML IV SOLN: 10.0000 meq | INTRAVENOUS | Status: DC

## 2021-08-20 MED ORDER — MAGNESIUM OXIDE -MG SUPPLEMENT 400 (240 MG) MG PO TABS
400.0000 mg | ORAL_TABLET | Freq: Two times a day (BID) | ORAL | Status: DC
  Administered 2021-08-20: 400 mg via ORAL
  Filled 2021-08-20: qty 1

## 2021-08-20 MED ORDER — SODIUM CHLORIDE 0.9 % IV SOLN
100.0000 mg | INTRAVENOUS | Status: DC
  Administered 2021-08-21 – 2021-08-29 (×9): 100 mg via INTRAVENOUS
  Filled 2021-08-20 (×9): qty 100

## 2021-08-20 MED ORDER — PANTOPRAZOLE SODIUM 40 MG PO TBEC
40.0000 mg | DELAYED_RELEASE_TABLET | Freq: Every day | ORAL | Status: DC
  Administered 2021-08-20 – 2021-08-29 (×10): 40 mg via ORAL
  Filled 2021-08-20 (×10): qty 1

## 2021-08-20 MED ORDER — POTASSIUM CHLORIDE 20 MEQ PO PACK
40.0000 meq | PACK | Freq: Two times a day (BID) | ORAL | Status: DC
  Administered 2021-08-20: 40 meq via ORAL
  Filled 2021-08-20: qty 2

## 2021-08-20 MED ORDER — METHOCARBAMOL 1000 MG/10ML IJ SOLN
500.0000 mg | Freq: Three times a day (TID) | INTRAVENOUS | Status: DC
  Administered 2021-08-20 – 2021-08-26 (×15): 500 mg via INTRAVENOUS
  Filled 2021-08-20 (×5): qty 500
  Filled 2021-08-20: qty 5
  Filled 2021-08-20: qty 500
  Filled 2021-08-20: qty 5
  Filled 2021-08-20 (×3): qty 500
  Filled 2021-08-20: qty 5
  Filled 2021-08-20: qty 500
  Filled 2021-08-20: qty 5
  Filled 2021-08-20: qty 500
  Filled 2021-08-20: qty 5
  Filled 2021-08-20 (×4): qty 500

## 2021-08-20 MED ORDER — MORPHINE SULFATE (PF) 2 MG/ML IV SOLN
2.0000 mg | INTRAVENOUS | Status: DC | PRN
  Administered 2021-08-20: 3 mg via INTRAVENOUS
  Administered 2021-08-20 – 2021-08-21 (×5): 2 mg via INTRAVENOUS
  Administered 2021-08-21 (×2): 4 mg via INTRAVENOUS
  Administered 2021-08-21: 2 mg via INTRAVENOUS
  Administered 2021-08-21 – 2021-08-22 (×2): 4 mg via INTRAVENOUS
  Administered 2021-08-22: 2 mg via INTRAVENOUS
  Administered 2021-08-23 (×2): 4 mg via INTRAVENOUS
  Administered 2021-08-23 (×2): 2 mg via INTRAVENOUS
  Administered 2021-08-24: 4 mg via INTRAVENOUS
  Filled 2021-08-20: qty 1
  Filled 2021-08-20: qty 2
  Filled 2021-08-20 (×3): qty 1
  Filled 2021-08-20: qty 2
  Filled 2021-08-20: qty 1
  Filled 2021-08-20: qty 2
  Filled 2021-08-20 (×4): qty 1
  Filled 2021-08-20: qty 2
  Filled 2021-08-20: qty 1
  Filled 2021-08-20: qty 2
  Filled 2021-08-20 (×2): qty 1
  Filled 2021-08-20 (×2): qty 2
  Filled 2021-08-20: qty 1

## 2021-08-20 NOTE — Progress Notes (Addendum)
Progress Note     Subjective: Still having abdominal pain but improved overall since admission and stable. Tolerating diet and passing flatus. No BM. Tolerating drain She has had a cough since prior to admission and prior to abdominal symptoms but it has gotten worse over last 24 hours. Nonproductive. No history of asthma. She feels a little short of breath.   Objective: Vital signs in last 24 hours: Temp:  [98.6 F (37 C)-99.6 F (37.6 C)] 98.9 F (37.2 C) (02/17 0608) Pulse Rate:  [78-85] 85 (02/17 0608) Resp:  [16-18] 16 (02/17 0608) BP: (97-112)/(67-71) 98/67 (02/17 0608) SpO2:  [90 %-91 %] 91 % (02/17 0608) Last BM Date : 08/16/21  Intake/Output from previous day: 02/16 0701 - 02/17 0700 In: 360 [P.O.:360] Out: -  Intake/Output this shift: No intake/output data recorded.  PE: General: pleasant, WD, female who is laying in bed in NAD HEENT: head is normocephalic, atraumatic.  Mouth is pink and moist Heart: Palpable radial pulses bilaterally Lungs:  CTAB. Respiratory effort nonlabored on room air. Intermittent dry cough Abd: soft, mild distension, mild TTP in bilateral lower quadrants and suprapubic regions, +BS Transgluteal drain with clear yellow output MSK: all 4 extremities are symmetrical with no cyanosis, clubbing, or edema. Skin: warm and dry with no masses, lesions, or rashes Psych: A&Ox3 with an appropriate affect.    Lab Results:  Recent Labs    08/19/21 0522 08/20/21 0529  WBC 12.5* 14.6*  HGB 10.9* 10.9*  HCT 32.1* 31.7*  PLT 322 375    BMET Recent Labs    08/19/21 0522 08/20/21 0529  NA 132* 132*  K 3.5 2.9*  CL 99 97*  CO2 26 27  GLUCOSE 109* 105*  BUN <5* <5*  CREATININE 0.45 0.45  CALCIUM 8.4* 8.4*    PT/INR Recent Labs    08/18/21 0622  LABPROT 14.1  INR 1.1    CMP     Component Value Date/Time   NA 132 (L) 08/20/2021 0529   K 2.9 (L) 08/20/2021 0529   CL 97 (L) 08/20/2021 0529   CO2 27 08/20/2021 0529   GLUCOSE  105 (H) 08/20/2021 0529   BUN <5 (L) 08/20/2021 0529   CREATININE 0.45 08/20/2021 0529   CALCIUM 8.4 (L) 08/20/2021 0529   PROT 6.9 08/16/2021 1554   ALBUMIN 2.6 (L) 08/18/2021 0622   AST 12 (L) 08/16/2021 1554   ALT 7 08/16/2021 1554   ALKPHOS 44 08/16/2021 1554   BILITOT 0.5 08/16/2021 1554   GFRNONAA >60 08/20/2021 0529   Lipase     Component Value Date/Time   LIPASE 28 08/16/2021 1554       Studies/Results: CT IMAGE GUIDED DRAINAGE BY PERCUTANEOUS CATHETER  Result Date: 08/18/2021 INDICATION: Pelvic abscess EXAM: Procedures: CT-GUIDED RIGHT TRANSGLUTEAL APPROACH PELVIC DRAINAGE CATHETER PLACEMENT RADIATION DOSE REDUCTION: This exam was performed according to the departmental dose-optimization program which includes automated exposure control, adjustment of the mA and/or kV according to patient size and/or use of iterative reconstruction technique. COMPARISON:  CT AP, 08/16/2021 MEDICATIONS: The patient is currently admitted to the hospital and receiving intravenous antibiotics. The antibiotics were administered within an appropriate time frame prior to the initiation of the procedure. ANESTHESIA/SEDATION: Moderate (conscious) sedation was employed during this procedure. A total of Versed 3 mg and Fentanyl 150 mcg was administered intravenously. Moderate Sedation Time: 28 minutes. The patient's level of consciousness and vital signs were monitored continuously by radiology nursing throughout the procedure under my direct supervision. CONTRAST:  None  COMPLICATIONS: None immediate. PROCEDURE: Informed written consent was obtained from the patient and/or patient's representative after a discussion of the risks, benefits and alternatives to treatment. The patient was placed prone on the CT gantry and a pre procedural CT was performed re-demonstrating the known abscess/fluid collection within the pelvis. The procedure was planned. A timeout was performed prior to the initiation of the  procedure. The RIGHT gluteal soft tissues were prepped and draped in the usual sterile fashion. The overlying soft tissues were anesthetized with 1% lidocaine with epinephrine. Appropriate trajectory was planned with the use of a 22 gauge spinal needle. An 18 gauge trocar needle was advanced into the abscess/fluid collection and a short Amplatz super stiff wire was coiled within the collection. Appropriate positioning was confirmed with a limited CT scan. The tract was serially dilated allowing placement of a 12 [place] all-purpose drainage catheter. Appropriate positioning was confirmed with a limited postprocedural CT scan. 15 ml of purulent fluid was aspirated. The tube was connected to a bulb suction and sutured in place. A dressing was placed. The patient tolerated the procedure well without immediate post procedural complication. IMPRESSION: Successful CT guided placement of a 12 Fr drainage catheter into the pelvic abscess via a RIGHT transgluteal approach, as above. Samples were sent to the laboratory as requested by the ordering clinical team. Michaelle Birks, MD [facility] Radiology Electronically Signed   By: Michaelle Birks M.D.   On: 08/18/2021 17:15    Anti-infectives: Anti-infectives (From admission, onward)    Start     Dose/Rate Route Frequency Ordered Stop   08/21/21 1000  anidulafungin (ERAXIS) 100 mg in sodium chloride 0.9 % 100 mL IVPB        100 mg 78 mL/hr over 100 Minutes Intravenous Every 24 hours 08/20/21 0724     08/20/21 0900  anidulafungin (ERAXIS) 200 mg in sodium chloride 0.9 % 200 mL IVPB        200 mg 78 mL/hr over 200 Minutes Intravenous  Once 08/20/21 0722     08/17/21 1730  hydroxychloroquine (PLAQUENIL) tablet 200 mg        200 mg Oral Daily 08/17/21 1630     08/16/21 1815  piperacillin-tazobactam (ZOSYN) IVPB 3.375 g        3.375 g 12.5 mL/hr over 240 Minutes Intravenous Every 8 hours 08/16/21 1805           Assessment/Plan Pelvic abscess Chronic peritonitis - Infectious vs inflammatory enteritis vs appendicitis, possible diverticulitis vs crohn's Leukocytosis  - CT showing: "1. Constellation of findings, most consistent with an inflammatory lower abdominal and pelvic process, including developing cul-de-sac abscess. Question if this represents infectious or inflammatory enteritis versus appendicitis. 2. Mild small bowel dilatation, suggesting secondary adynamic ileus." - WBC 14.6 (12.5), afebrile, continue IV abx - lactic acid normal 2/13 - IR transgluteal drain 2/15 - 15 ml purulent fluid aspirated with placement. Nothing charted last 24h but patient notes was recently emptied. More purulence aspirated by IR yesterday with bedside flushing - continue IV abx and follow drain culture - gram stain with gram neg rods, gram pos cocci in pairs, gram pos rods - add eraxis - cough - present since PTA. Have ordered chest x ray today  - No urgent/emergent surgery recommended at this time and favor continued IV abx, drain, and repeat imaging. Monitor abdominal exam and labs and should she acutely worsen then operative intervention may become acutely necessary. Continue to plan for colonoscopy outpatient once inflammation  resolved pending acute course   FEN: dysphagia 1 ID: zosyn 2/13 >>, eraxis 2/17>> VTE: okay for chemical prophylaxis from surgical perspective   Lupus - on plaquenil hypothyroidism  I reviewed Consultant (IR) notes, hospitalist notes, last 24 h vitals and pain scores, last 48 h intake and output, and last 24 h labs and trends.  This care required moderate level of medical decision making.    LOS: 3 days   [facility] 08/20/2021, 9:50 AM Please see Amion for pager number during day hours 7:00am-4:30pm

## 2021-08-20 NOTE — Progress Notes (Signed)
Referring Physician(s): Gross,S  Supervising Physician: Sandi Mariscal  Patient Status:  [facility] - In-pt  Chief Complaint: Pelvic pain, nausea, pelvic abscess   Subjective: Pt cont to have intermittent nausea, burning ant pelvic discomfort; has had small BM and passing some gas today   Allergies: Patient has no known allergies.  Medications: Prior to Admission medications   Medication Sig Start Date End Date Taking? Authorizing Provider  buPROPion (WELLBUTRIN XL) 300 MG 24 hr tablet Take 300 mg by mouth daily. 07/29/21  Yes [provider]  escitalopram (LEXAPRO) 10 MG tablet Take 10 mg by mouth daily. 07/17/21  Yes [provider]  hydroxychloroquine (PLAQUENIL) 200 MG tablet Take 200 mg by mouth daily.   Yes [provider]  ibuprofen (ADVIL) 200 MG tablet Take 200 mg by mouth every 6 (six) hours as needed.   Yes [provider]  levothyroxine (SYNTHROID, LEVOTHROID) 100 MCG tablet Take 100 mcg by mouth daily before breakfast.   Yes [provider]  ondansetron (ZOFRAN ODT) 4 MG disintegrating tablet Take 1 tablet (4 mg total) by mouth every 8 (eight) hours as needed for nausea or vomiting. Patient not taking: Reported on 08/17/2021 02/15/21   Claud Kelp, MD     Vital Signs: BP 106/77    Pulse 80    Temp 97.7 F (36.5 C) (Oral)    Resp 18    Ht 5\' 5"  (1.651 m)    Wt 125 lb (56.7 kg)    SpO2 (!) 89%    BMI 20.80 kg/m   Physical Exam awake/alert; rt TG drain intact, insertion site ok, mildly tender, OP about 15-20 cc yellow fluid with some tissue fragments; drain flushed with 10 cc saline with return of same amount  Imaging: CT ABDOMEN PELVIS W CONTRAST  Result Date: 08/16/2021 CLINICAL DATA:  Abdominal pain with shortness of breath. Worsening since Saturday. EXAM: CT ABDOMEN AND PELVIS WITH CONTRAST TECHNIQUE: Multidetector CT imaging of the abdomen and pelvis was performed using the standard protocol following bolus administration  of intravenous contrast. RADIATION DOSE REDUCTION: This exam was performed according to the departmental dose-optimization program which includes automated exposure control, adjustment of the mA and/or kV according to patient size and/or use of iterative reconstruction technique. CONTRAST:  2mL OMNIPAQUE IOHEXOL 300 MG/ML  SOLN COMPARISON:  None. FINDINGS: Lower chest: Mild dependent bibasilar atelectasis. Normal heart size without pericardial or pleural effusion. Tiny hiatal hernia. Hepatobiliary: Normal liver. Normal gallbladder, without biliary ductal dilatation. Pancreas: Normal, without mass or ductal dilatation. Spleen: Normal in size, without focal abnormality. Adrenals/Urinary Tract: Normal adrenal glands. Normal kidneys, without hydronephrosis. Normal urinary bladder. Stomach/Bowel: Normal remainder of the stomach. Anatomy is distorted, presumably secondary to subacute inflammation. The distal small bowel is inflamed. A moderately dilated appendix may be identified on coronal images 47 through 59. There is extensive interloop fluid including on 54/2. Small bowel loops are mildly dilated, likely due to secondary adynamic ileus. Vascular/Lymphatic: Aortic atherosclerosis. No abdominopelvic adenopathy. Reproductive: Hysterectomy.  No adnexal mass. Other: Pelvic cul-de-sac fluid collection with peritoneal enhancement and thickening, including at 7.8 x 4.3 cm on 63/2. Musculoskeletal: Degenerate disc disease at L4-5. Transitional S1 vertebral body. IMPRESSION: 1. Constellation of findings, most consistent with an inflammatory lower abdominal and pelvic process, including developing cul-de-sac abscess. Due to anatomic distortion from presumed subacute inflammation, it is difficult to delineate if this process represents infectious or inflammatory enteritis versus appendicitis. Consider surgical consultation for probable pelvic cul-de-sac fluid collection drainage. A potential clinical  strategies includes  antibiotics and follow-up pelvic CT at 3-4 days. 2. Mild small bowel dilatation, suggesting secondary adynamic ileus. 3.  Aortic Atherosclerosis (ICD10-I70.0). 4.  Tiny hiatal hernia. These results will be called to the ordering clinician or representative by the Radiologist Assistant, and communication documented in the PACS or [employer]. Electronically Signed   By: Abigail Miyamoto M.D.   On: 08/16/2021 17:47   DG CHEST PORT 1 VIEW  Result Date: 08/20/2021 CLINICAL DATA:  New pelvic drain placement 2 days ago. Persistent cough for 2 months. EXAM: PORTABLE CHEST 1 VIEW COMPARISON:  Chest radiographs 04/28/2021.  Abdominal CT 08/16/2021. FINDINGS: 1019 hours. Low lung volumes. The heart size and mediastinal contours are stable. There are patchy opacities at both lung bases which have mildly increased from the recent CT and likely represent atelectasis. There are possible small bilateral pleural effusions. No confluent airspace opacity or pneumothorax. The bones appear unremarkable. IMPRESSION: Increased bibasilar atelectasis and possible small bilateral pleural effusions compared with recent abdominal CT. No confluent airspace opacity. Electronically Signed   By: Richardean Sale M.D.   On: 08/20/2021 10:38   CT IMAGE GUIDED DRAINAGE BY PERCUTANEOUS CATHETER  Result Date: 08/18/2021 INDICATION: Pelvic abscess EXAM: Procedures: CT-GUIDED RIGHT TRANSGLUTEAL APPROACH PELVIC DRAINAGE CATHETER PLACEMENT RADIATION DOSE REDUCTION: This exam was performed according to the departmental dose-optimization program which includes automated exposure control, adjustment of the mA and/or kV according to patient size and/or use of iterative reconstruction technique. COMPARISON:  CT AP, 08/16/2021 MEDICATIONS: The patient is currently admitted to the hospital and receiving intravenous antibiotics. The antibiotics were administered within an appropriate time frame prior to the initiation of the procedure.  ANESTHESIA/SEDATION: Moderate (conscious) sedation was employed during this procedure. A total of Versed 3 mg and Fentanyl 150 mcg was administered intravenously. Moderate Sedation Time: 28 minutes. The patient's level of consciousness and vital signs were monitored continuously by radiology nursing throughout the procedure under my direct supervision. CONTRAST:  None COMPLICATIONS: None immediate. PROCEDURE: Informed written consent was obtained from the patient and/or patient's representative after a discussion of the risks, benefits and alternatives to treatment. The patient was placed prone on the CT gantry and a pre procedural CT was performed re-demonstrating the known abscess/fluid collection within the pelvis. The procedure was planned. A timeout was performed prior to the initiation of the procedure. The RIGHT gluteal soft tissues were prepped and draped in the usual sterile fashion. The overlying soft tissues were anesthetized with 1% lidocaine with epinephrine. Appropriate trajectory was planned with the use of a 22 gauge spinal needle. An 18 gauge trocar needle was advanced into the abscess/fluid collection and a short Amplatz super stiff wire was coiled within the collection. Appropriate positioning was confirmed with a limited CT scan. The tract was serially dilated allowing placement of a 12 [place] all-purpose drainage catheter. Appropriate positioning was confirmed with a limited postprocedural CT scan. 15 ml of purulent fluid was aspirated. The tube was connected to a bulb suction and sutured in place. A dressing was placed. The patient tolerated the procedure well without immediate post procedural complication. IMPRESSION: Successful CT guided placement of a 12 Fr drainage catheter into the pelvic abscess via a RIGHT transgluteal approach, as above. Samples were sent to the laboratory as requested by the ordering clinical team. Michaelle Birks, MD [facility]  [facility] Radiology Electronically Signed   By: Michaelle Birks M.D.   On: 08/18/2021 17:15    Labs:  CBC: Recent Labs  08/17/21 1353 08/18/21 0622 08/19/21 0522 08/20/21 0529  WBC 11.1* 7.9 12.5* 14.6*  HGB 11.2* 10.4* 10.9* 10.9*  HCT 32.0* 30.1* 32.1* 31.7*  PLT 266 264 322 375    COAGS: Recent Labs    08/18/21 0622  INR 1.1    BMP: Recent Labs    08/17/21 1353 08/18/21 0622 08/19/21 0522 08/20/21 0529  NA 129* 128* 132* 132*  K 3.0* 2.9* 3.5 2.9*  CL 97* 98 99 97*  CO2 20* 22 26 27   GLUCOSE 81 116* 109* 105*  BUN 11 6 <5* <5*  CALCIUM 8.6* 8.0* 8.4* 8.4*  CREATININE 0.48 0.55 0.45 0.45  GFRNONAA >60 >60 >60 >60    LIVER FUNCTION TESTS: Recent Labs    08/16/21 1554 08/17/21 1353 08/18/21 0622  BILITOT 0.5  --   --   AST 12*  --   --   ALT 7  --   --   ALKPHOS 44  --   --   PROT 6.9  --   --   ALBUMIN 3.7 2.9* 2.6*    Assessment and Plan: 61 y.o. female ex-smoker with past medical history significant for lupus, arthritis, anxiety disorder, and hypothyroidism ; now with pelvic pain, leukocytosis, pelvic abscess, s/p 12 fr drain placement via TG approach on 08/18/21; drain to JP bulb; afebrile; WBC 14.6(12.5), hgb stable, creat nl; K 2.9- replace; drain fl cx pend; blood cx neg to date; cont with drain irrigation/close OP monitoring, lab checks; if WBC cont to rise obtain f/u CT to assess for resolution of drained collection  as well as any new undrained collections   Electronically Signed: D. Rowe Robert, PA-C 08/20/2021, 2:37 PM   I spent a total of 15 Minutes at the the patient's bedside AND on the patient's hospital floor or unit, greater than 50% of which was counseling/coordinating care for pelvic abscess drain    Patient ID: Deanna Fischer, female   DOB: 1961-03-07, 61 y.o.   MRN: [id number]

## 2021-08-20 NOTE — Plan of Care (Signed)
°  Problem: Clinical Measurements: °Goal: Ability to maintain clinical measurements within normal limits will improve °Outcome: Progressing °  °Problem: Activity: °Goal: Risk for activity intolerance will decrease °Outcome: Progressing °  °Problem: Nutrition: °Goal: Adequate nutrition will be maintained °Outcome: Progressing °  °

## 2021-08-20 NOTE — Progress Notes (Signed)
PROGRESS NOTE    Deanna Fischer  MBE:[id number] DOB: 02/18/1961 DOA: 08/16/2021 PCP: Patient, No Pcp Per (Inactive)     Brief Narrative:    Deanna Fischer is a 61 y.o. female with medical history significant of hypothyroidism, SLE, depression with anxiety, claustrophobia, PTSD, fibromyositis who is coming to the emergency department with complaints of abdominal pain since last week associated with multiple episodes of emesis, diarrhea and then constipation since Friday.  She had some melanotic stools earlier last week  Subjective:   She is frustrated that her WBC is elevated , continue have abdominal discomfort  Drain in place There is no fever  Assessment & Plan:  Principal Problem:   Enteritis Active Problems:   Hypothyroidism   Hyponatremia   Hypokalemia   Normocytic anemia   Depression with anxiety   Hypophosphatemia    Assessment and Plan:    Pelvic abscess -Unclear source --s/p CT-guided drain/catheter placement on 2/15 -General surgery and interventional radiology consulted, will follow recommendation -Currently on Zosyn   Electrolyte abnormalities Hyponatremia/hypokalemia/hypophosphatemia Likely due to poor oral intake, remain low continue to replace Recheck in the morning  SLE Continue hydroxychloroquine Follows with [facility]ology  Hypothyroidism Continue Synthroid supplement   : Body mass index is 20.8 kg/m..    I have Reviewed nursing notes, Vitals, and Lab results since pt's last encounter. Pertinent lab results CBC, BMP, detail please see discussion above  I ordered the following labs:  Unresulted Labs (From admission, onward)     Start     Ordered   08/23/21 0500  CBC  Every Mon-Wed-Fri (0500),   R      08/20/21 0807   08/21/21 0500  CBC with Differential/Platelet  Tomorrow morning,   R        08/20/21 0807   08/20/21 0071  Basic metabolic panel  Daily,   R      08/19/21 1910             DVT prophylaxis: Place and  maintain sequential compression device Start: 08/19/21 0801 SCDs Start: 08/17/21 1328   Code Status:   Code Status: Full Code  Family Communication: Patient Disposition:   Status is: Inpatient  Dispo: The patient is from: Home              Anticipated d/c is to: Home              Anticipated d/c date is: TBD  Antimicrobials:    Anti-infectives (From admission, onward)    Start     Dose/Rate Route Frequency Ordered Stop   08/21/21 1000  anidulafungin (ERAXIS) 100 mg in sodium chloride 0.9 % 100 mL IVPB        100 mg 78 mL/hr over 100 Minutes Intravenous Every 24 hours 08/20/21 0724     08/20/21 0900  anidulafungin (ERAXIS) 200 mg in sodium chloride 0.9 % 200 mL IVPB        200 mg 78 mL/hr over 200 Minutes Intravenous  Once 08/20/21 0722 08/20/21 1347   08/17/21 1730  hydroxychloroquine (PLAQUENIL) tablet 200 mg  Status:  Discontinued        200 mg Oral Daily 08/17/21 1630 08/20/21 1225   08/16/21 1815  piperacillin-tazobactam (ZOSYN) IVPB 3.375 g        3.375 g 12.5 mL/hr over 240 Minutes Intravenous Every 8 hours 08/16/21 1805            Objective: Vitals:   08/19/21 1539 08/19/21 2028 08/20/21 0608 08/20/21 1349  BP:  112/71 97/71 98/67  106/77  Pulse: 78 85 85 80  Resp: 16 18 16 18   Temp: 98.6 F (37 C) 99.6 F (37.6 C) 98.9 F (37.2 C) 97.7 F (36.5 C)  TempSrc: Oral Oral Oral Oral  SpO2: 91% 90% 91% (!) 89%  Weight:      Height:        Intake/Output Summary (Last 24 hours) at 08/20/2021 1849 Last data filed at 08/20/2021 1722 Gross per 24 hour  Intake 555.3 ml  Output 18 ml  Net 537.3 ml   Filed Weights   08/16/21 1539  Weight: 56.7 kg    Examination:  General exam: alert, awake, communicative,calm, NAD Respiratory system: Clear to auscultation. Respiratory effort normal. Cardiovascular system:  RRR.  Gastrointestinal system: Abdomen is slightly more distended compared to yesterday, soft , mild tender, decreased bowel sounds, . + drain Central  nervous system: Alert and oriented. No focal neurological deficits. Extremities:  no edema Skin: No rashes, lesions or ulcers Psychiatry: Judgement and insight appear normal. Mood & affect appropriate.     Data Reviewed: I have personally reviewed  labs and visualized  imaging studies since the last encounter and formulate the plan        Scheduled Meds:  acetaminophen  1,000 mg Oral Q6H   buPROPion  300 mg Oral Daily   escitalopram  10 mg Oral Daily   levothyroxine  100 mcg Oral Q0600   pantoprazole  40 mg Oral Q1200   sodium chloride flush  10 mL Intracatheter Q12H   Continuous Infusions:  sodium chloride     [START ON 08/21/2021] anidulafungin     methocarbamol (ROBAXIN) IV     piperacillin-tazobactam (ZOSYN)  IV 3.375 g (08/20/21 1820)     LOS: 3 days      Florencia Reasons, MD PhD FACP [facility]  Available via Epic secure chat 7am-7pm for nonurgent issues Please page for urgent issues To page the attending provider between 7A-7P or the covering provider during after hours 7P-7A, please log into the web site [email or web address] and access using universal [place] password for that web site. If you do not have the password, please call the hospital operator.    08/20/2021, 6:49 PM

## 2021-08-21 ENCOUNTER — Inpatient Hospital Stay (HOSPITAL_COMMUNITY): Payer: Medicare Other

## 2021-08-21 DIAGNOSIS — K529 Noninfective gastroenteritis and colitis, unspecified: Secondary | ICD-10-CM | POA: Diagnosis not present

## 2021-08-21 LAB — BASIC METABOLIC PANEL
Anion gap: 9 (ref 5–15)
BUN: <5 mg/dL — ABNORMAL LOW (ref 6–20)
CO2: 26 mmol/L (ref 22–32)
Calcium: 8.2 mg/dL — ABNORMAL LOW (ref 8.9–10.3)
Chloride: 98 mmol/L (ref 98–111)
Creatinine, Ser: 0.5 mg/dL (ref 0.44–1.00)
GFR, Estimated: >60 mL/min (ref >60)
Glucose, Bld: 85 mg/dL (ref 70–99)
Potassium: 3.2 mmol/L — ABNORMAL LOW (ref 3.5–5.1)
Sodium: 133 mmol/L — ABNORMAL LOW (ref 135–145)

## 2021-08-21 LAB — CBC WITH DIFFERENTIAL/PLATELET
Abs Immature Granulocytes: 0.31 10*3/uL — ABNORMAL HIGH (ref 0.00–0.07)
Basophils Absolute: 0.1 10*3/uL (ref 0.0–0.1)
Basophils Relative: 0 %
Eosinophils Absolute: 0.2 10*3/uL (ref 0.0–0.5)
Eosinophils Relative: 1 %
HCT: 32 % — ABNORMAL LOW (ref 36.0–46.0)
Hemoglobin: 11.1 g/dL — ABNORMAL LOW (ref 12.0–15.0)
Immature Granulocytes: 2 %
Lymphocytes Relative: 11 %
Lymphs Abs: 1.7 10*3/uL (ref 0.7–4.0)
MCH: 33.6 pg (ref 26.0–34.0)
MCHC: 34.7 g/dL (ref 30.0–36.0)
MCV: 97 fL (ref 80.0–100.0)
Monocytes Absolute: 1.3 10*3/uL — ABNORMAL HIGH (ref 0.1–1.0)
Monocytes Relative: 8 %
Neutro Abs: 12.2 10*3/uL — ABNORMAL HIGH (ref 1.7–7.7)
Neutrophils Relative %: 78 %
Platelets: 427 10*3/uL — ABNORMAL HIGH (ref 150–400)
RBC: 3.3 MIL/uL — ABNORMAL LOW (ref 3.87–5.11)
RDW: 13.1 % (ref 11.5–15.5)
WBC: 15.7 10*3/uL — ABNORMAL HIGH (ref 4.0–10.5)
nRBC: 0 % (ref 0.0–0.2)

## 2021-08-21 LAB — GLUCOSE, CAPILLARY
Glucose-Capillary: 82 mg/dL (ref 70–99)
Glucose-Capillary: 80 mg/dL (ref 70–99)
Glucose-Capillary: 84 mg/dL (ref 70–99)

## 2021-08-21 LAB — CULTURE, BLOOD (ROUTINE X 2)
Culture: NO GROWTH
Culture: NO GROWTH
Special Requests: ADEQUATE
Special Requests: ADEQUATE

## 2021-08-21 MED ORDER — IOHEXOL 300 MG/ML  SOLN
100.0000 mL | Freq: Once | INTRAMUSCULAR | Status: AC | PRN
  Administered 2021-08-21: 100 mL via INTRAVENOUS

## 2021-08-21 MED ORDER — IOHEXOL 9 MG/ML PO SOLN
ORAL | Status: AC
  Administered 2021-08-21: 500 mL
  Filled 2021-08-21: qty 1000

## 2021-08-21 MED ORDER — IOHEXOL 9 MG/ML PO SOLN
500.0000 mL | ORAL | Status: AC
  Administered 2021-08-21: 500 mL via ORAL

## 2021-08-21 MED ORDER — DOCUSATE SODIUM 100 MG PO CAPS
100.0000 mg | ORAL_CAPSULE | Freq: Every day | ORAL | Status: DC
  Administered 2021-08-21 – 2021-08-22 (×2): 100 mg via ORAL
  Filled 2021-08-21 (×2): qty 1

## 2021-08-21 NOTE — Progress Notes (Signed)
Chief Complaint/Subjective: Continued abdominal pain, nausea yesterday, resolved today  Objective: Vital signs in last 24 hours: Temp:  [97.7 F (36.5 C)-98.7 F (37.1 C)] 98.7 F (37.1 C) (02/18 0846) Pulse Rate:  [80-84] 82 (02/18 0846) Resp:  [16-18] 18 (02/18 0846) BP: (106-123)/(75-82) 108/75 (02/18 0846) SpO2:  [89 %-94 %] 94 % (02/18 0846) Last BM Date : 08/20/21 Intake/Output from previous day: 02/17 0701 - 02/18 0700 In: 555.3 [IV Piggyback:555.3] Out: 18 [Drains:18] Intake/Output this shift: No intake/output data recorded.  PE: Gen: NAD Resp: nonlabored Card: RRR Abd: tender in right lower abdomen  Lab Results:  Recent Labs    08/20/21 0529 08/21/21 0826  WBC 14.6* 15.7*  HGB 10.9* 11.1*  HCT 31.7* 32.0*  PLT 375 427*   BMET Recent Labs    08/19/21 0522 08/20/21 0529  NA 132* 132*  K 3.5 2.9*  CL 99 97*  CO2 26 27  GLUCOSE 109* 105*  BUN <5* <5*  CREATININE 0.45 0.45  CALCIUM 8.4* 8.4*   PT/INR No results for input(s): LABPROT, INR in the last 72 hours. CMP     Component Value Date/Time   NA 132 (L) 08/20/2021 0529   K 2.9 (L) 08/20/2021 0529   CL 97 (L) 08/20/2021 0529   CO2 27 08/20/2021 0529   GLUCOSE 105 (H) 08/20/2021 0529   BUN <5 (L) 08/20/2021 0529   CREATININE 0.45 08/20/2021 0529   CALCIUM 8.4 (L) 08/20/2021 0529   PROT 6.9 08/16/2021 1554   ALBUMIN 2.6 (L) 08/18/2021 0622   AST 12 (L) 08/16/2021 1554   ALT 7 08/16/2021 1554   ALKPHOS 44 08/16/2021 1554   BILITOT 0.5 08/16/2021 1554   GFRNONAA >60 08/20/2021 0529   Lipase     Component Value Date/Time   LIPASE 28 08/16/2021 1554    Studies/Results: DG CHEST PORT 1 VIEW  Result Date: 08/20/2021 CLINICAL DATA:  New pelvic drain placement 2 days ago. Persistent cough for 2 months. EXAM: PORTABLE CHEST 1 VIEW COMPARISON:  Chest radiographs 04/28/2021.  Abdominal CT 08/16/2021. FINDINGS: 1019 hours. Low lung volumes. The heart size and mediastinal contours are  stable. There are patchy opacities at both lung bases which have mildly increased from the recent CT and likely represent atelectasis. There are possible small bilateral pleural effusions. No confluent airspace opacity or pneumothorax. The bones appear unremarkable. IMPRESSION: Increased bibasilar atelectasis and possible small bilateral pleural effusions compared with recent abdominal CT. No confluent airspace opacity. Electronically Signed   By: Richardean Sale M.D.   On: 08/20/2021 10:38    Anti-infectives: Anti-infectives (From admission, onward)    Start     Dose/Rate Route Frequency Ordered Stop   08/21/21 1000  anidulafungin (ERAXIS) 100 mg in sodium chloride 0.9 % 100 mL IVPB        100 mg 78 mL/hr over 100 Minutes Intravenous Every 24 hours 08/20/21 0724     08/20/21 0900  anidulafungin (ERAXIS) 200 mg in sodium chloride 0.9 % 200 mL IVPB        200 mg 78 mL/hr over 200 Minutes Intravenous  Once 08/20/21 0722 08/20/21 1347   08/17/21 1730  hydroxychloroquine (PLAQUENIL) tablet 200 mg  Status:  Discontinued        200 mg Oral Daily 08/17/21 1630 08/20/21 1225   08/16/21 1815  piperacillin-tazobactam (ZOSYN) IVPB 3.375 g        3.375 g 12.5 mL/hr over 240 Minutes Intravenous Every 8 hours 08/16/21 1805  Assessment/Plan Pelvic abscess Chronic peritonitis - Infectious vs inflammatory enteritis vs appendicitis, possible diverticulitis vs crohn's Leukocytosis  - CT showing: "1. Constellation of findings, most consistent with an inflammatory lower abdominal and pelvic process, including developing cul-de-sac abscess. Question if this represents infectious or inflammatory enteritis versus appendicitis. 2. Mild small bowel dilatation, suggesting secondary adynamic ileus." - WBC 14.6 (12.5), afebrile, continue IV abx - lactic acid normal 2/13 - IR transgluteal drain 2/15 - 15 ml purulent fluid aspirated with placement. Nothing charted last 24h but patient notes was recently emptied.  More purulence aspirated by IR yesterday with bedside flushing - continue IV abx and follow drain culture - gram stain with gram neg rods, gram pos cocci in pairs, gram pos rods - add eraxis - cough - present since PTA. - repeat CT scan for 2/18   - No urgent/emergent surgery recommended at this time and favor continued IV abx, drain, and repeat imaging. Monitor abdominal exam and labs and should she acutely worsen then operative intervention may become acutely necessary. Continue to plan for colonoscopy outpatient once inflammation resolved pending acute course   FEN: NPO ID: [email or web address] 2/13 >>, eraxis 2/17>> VTE: okay for chemical prophylaxis from surgical perspective   Lupus - on plaquenil hypothyroidism   LOS: 4 days   I reviewed last 24 h vitals and pain scores, last 48 h intake and output, last 24 h labs and trends, and last 24 h imaging results.  This care required high  level of medical decision making.   [facility] 08/21/2021, 9:10 AM Please see Amion for pager number during day hours 7:00am-4:30pm or 7:00am -11:30am on weekends

## 2021-08-21 NOTE — Progress Notes (Signed)
PROGRESS NOTE    Deanna Fischer  IEP:[id number] DOB: 1961-05-27 DOA: 08/16/2021 PCP: Patient, No Pcp Per (Inactive)     Brief Narrative:    Deanna Fischer is a 61 y.o. female with medical history significant of hypothyroidism, SLE, depression with anxiety, claustrophobia, PTSD, fibromyositis who is coming to the emergency department with complaints of abdominal pain since last week associated with multiple episodes of emesis, diarrhea and then constipation since Friday.  She had some melanotic stools earlier last week  Subjective:   WBC continue to trend up, there is no fever, but she reports ab is more distended, + flatus, + bm Ab Drain in place   Assessment & Plan:  Principal Problem:   Enteritis Active Problems:   Hypothyroidism   Hyponatremia   Hypokalemia   Normocytic anemia   Depression with anxiety   Hypophosphatemia    Assessment and Plan:    Pelvic abscess -Unclear source --s/p CT-guided drain/catheter placement on 2/15 --Currently on Zosyn, on antifungal, not improving,repeat ct ab today General surgery and interventional radiology consulted, will follow recommendation  Electrolyte abnormalities Hyponatremia/hypokalemia/hypophosphatemia Likely due to poor oral intake, remain low continue to replace Recheck in the morning  SLE Continue hydroxychloroquine Follows with [place] [facility] rheumatology  Hypothyroidism Continue Synthroid supplement   : Body mass index is 20.8 kg/m..    I have Reviewed nursing notes, Vitals, and Lab results since pt's last encounter. Pertinent lab results CBC, BMP, detail please see discussion above  I ordered the following labs:  Unresulted Labs (From admission, onward)     Start     Ordered   08/23/21 0500  CBC  Every Mon-Wed-Fri (0500),   R      08/20/21 0807   08/22/21 0500  CBC  Tomorrow morning,   R        08/21/21 0820   08/22/21 0500  Magnesium  Tomorrow morning,   STAT        08/21/21 1415   08/22/21 0500   Sedimentation rate  Tomorrow morning,   R        08/21/21 1415   08/22/21 0500  C-reactive protein  Tomorrow morning,   R        08/21/21 1415   08/22/21 0500  Lactic acid, plasma  Tomorrow morning,   R        08/21/21 1415   08/20/21 6606  Basic metabolic panel  Daily,   R      08/19/21 1910             DVT prophylaxis: Place and maintain sequential compression device Start: 08/19/21 0801 SCDs Start: 08/17/21 1328   Code Status:   Code Status: Full Code  Family Communication: Patient Disposition:   Status is: Inpatient  Dispo: The patient is from: Home              Anticipated d/c is to: Home              Anticipated d/c date is: TBD  Antimicrobials:    Anti-infectives (From admission, onward)    Start     Dose/Rate Route Frequency Ordered Stop   08/21/21 1000  anidulafungin (ERAXIS) 100 mg in sodium chloride 0.9 % 100 mL IVPB        100 mg 78 mL/hr over 100 Minutes Intravenous Every 24 hours 08/20/21 0724     08/20/21 0900  anidulafungin (ERAXIS) 200 mg in sodium chloride 0.9 % 200 mL IVPB        200 mg  78 mL/hr over 200 Minutes Intravenous  Once 08/20/21 0722 08/20/21 1347   08/17/21 1730  hydroxychloroquine (PLAQUENIL) tablet 200 mg  Status:  Discontinued        200 mg Oral Daily 08/17/21 1630 08/20/21 1225   08/16/21 1815  piperacillin-tazobactam (ZOSYN) IVPB 3.375 g        3.375 g 12.5 mL/hr over 240 Minutes Intravenous Every 8 hours 08/16/21 1805            Objective: Vitals:   08/20/21 1349 08/20/21 2155 08/21/21 0546 08/21/21 0846  BP: 106/77 120/82 123/78 108/75  Pulse: 80 84 80 82  Resp: 18 16 18 18   Temp: 97.7 F (36.5 C) 98.5 F (36.9 C) 98.4 F (36.9 C) 98.7 F (37.1 C)  TempSrc: Oral Oral Oral Oral  SpO2: (!) 89% 92% 90% 94%  Weight:      Height:        Intake/Output Summary (Last 24 hours) at 08/21/2021 1416 Last data filed at 08/21/2021 1006 Gross per 24 hour  Intake 0 ml  Output --  Net 0 ml   Filed Weights   08/16/21 1539   Weight: 56.7 kg    Examination:  General exam: alert, awake, communicative,calm, NAD Respiratory system: Clear to auscultation. Respiratory effort normal. Cardiovascular system:  RRR.  Gastrointestinal system: Abdomen is more distended , tight, mild diffuse tender, decreased bowel sounds, . + drain Central nervous system: Alert and oriented. No focal neurological deficits. Extremities:  no edema Skin: No rashes, lesions or ulcers Psychiatry: Judgement and insight appear normal. Mood & affect appropriate.     Data Reviewed: I have personally reviewed  labs and visualized  imaging studies since the last encounter and formulate the plan        Scheduled Meds:  acetaminophen  1,000 mg Oral Q6H   buPROPion  300 mg Oral Daily   escitalopram  10 mg Oral Daily   levothyroxine  100 mcg Oral Q0600   pantoprazole  40 mg Oral Q1200   sodium chloride flush  10 mL Intracatheter Q12H   Continuous Infusions:  sodium chloride     anidulafungin 100 mg (08/21/21 1006)   methocarbamol (ROBAXIN) IV 500 mg (08/21/21 1407)   piperacillin-tazobactam (ZOSYN)  IV 3.375 g (08/21/21 1138)     LOS: 4 days      Florencia Reasons, MD PhD FACP [facility]  Available via Epic secure chat 7am-7pm for nonurgent issues Please page for urgent issues To page the attending provider between 7A-7P or the covering provider during after hours 7P-7A, please log into the web site [email or web address] and access using universal [place] password for that web site. If you do not have the password, please call the hospital operator.    08/21/2021, 2:16 PM

## 2021-08-22 DIAGNOSIS — K529 Noninfective gastroenteritis and colitis, unspecified: Secondary | ICD-10-CM | POA: Diagnosis not present

## 2021-08-22 LAB — CBC
HCT: 31.1 % — ABNORMAL LOW (ref 36.0–46.0)
Hemoglobin: 10.6 g/dL — ABNORMAL LOW (ref 12.0–15.0)
MCH: 33.3 pg (ref 26.0–34.0)
MCHC: 34.1 g/dL (ref 30.0–36.0)
MCV: 97.8 fL (ref 80.0–100.0)
Platelets: 441 10*3/uL — ABNORMAL HIGH (ref 150–400)
RBC: 3.18 MIL/uL — ABNORMAL LOW (ref 3.87–5.11)
RDW: 13.2 % (ref 11.5–15.5)
WBC: 17 10*3/uL — ABNORMAL HIGH (ref 4.0–10.5)
nRBC: 0 % (ref 0.0–0.2)

## 2021-08-22 LAB — GLUCOSE, CAPILLARY
Glucose-Capillary: 108 mg/dL — ABNORMAL HIGH (ref 70–99)
Glucose-Capillary: 80 mg/dL (ref 70–99)
Glucose-Capillary: 81 mg/dL (ref 70–99)
Glucose-Capillary: 89 mg/dL (ref 70–99)
Glucose-Capillary: 95 mg/dL (ref 70–99)

## 2021-08-22 LAB — BASIC METABOLIC PANEL
Anion gap: 10 (ref 5–15)
BUN: <5 mg/dL — ABNORMAL LOW (ref 6–20)
CO2: 29 mmol/L (ref 22–32)
Calcium: 8.4 mg/dL — ABNORMAL LOW (ref 8.9–10.3)
Chloride: 96 mmol/L — ABNORMAL LOW (ref 98–111)
Creatinine, Ser: 0.5 mg/dL (ref 0.44–1.00)
GFR, Estimated: >60 mL/min (ref >60)
Glucose, Bld: 83 mg/dL (ref 70–99)
Potassium: 3.4 mmol/L — ABNORMAL LOW (ref 3.5–5.1)
Sodium: 135 mmol/L (ref 135–145)

## 2021-08-22 LAB — LACTIC ACID, PLASMA: Lactic Acid, Venous: 1.3 mmol/L (ref 0.5–1.9)

## 2021-08-22 LAB — SEDIMENTATION RATE: Sed Rate: 52 mm/hr — ABNORMAL HIGH (ref 0–22)

## 2021-08-22 LAB — C-REACTIVE PROTEIN: CRP: 20.9 mg/dL — ABNORMAL HIGH (ref <1.0)

## 2021-08-22 LAB — MAGNESIUM: Magnesium: 1.9 mg/dL (ref 1.7–2.4)

## 2021-08-22 MED ORDER — UNJURY CHICKEN SOUP POWDER
2.0000 [oz_av] | Freq: Two times a day (BID) | ORAL | Status: DC
  Administered 2021-08-23 – 2021-08-24 (×3): 2 [oz_av] via ORAL
  Filled 2021-08-22 (×14): qty 13.5

## 2021-08-22 MED ORDER — ENSURE ENLIVE PO LIQD
237.0000 mL | Freq: Two times a day (BID) | ORAL | Status: DC
  Administered 2021-08-22 – 2021-08-29 (×13): 237 mL via ORAL

## 2021-08-22 MED ORDER — UNJURY CHICKEN SOUP POWDER
2.0000 [oz_av] | Freq: Two times a day (BID) | ORAL | Status: DC
  Filled 2021-08-22: qty 13.5

## 2021-08-22 MED ORDER — FUROSEMIDE 10 MG/ML IJ SOLN
40.0000 mg | Freq: Two times a day (BID) | INTRAMUSCULAR | Status: AC
  Administered 2021-08-22: 40 mg via INTRAVENOUS
  Filled 2021-08-22 (×2): qty 4

## 2021-08-22 MED ORDER — UNJURY VANILLA POWDER: 2.0000 [oz_av] | Freq: Two times a day (BID) | ORAL | Status: DC

## 2021-08-22 MED ORDER — POTASSIUM CHLORIDE CRYS ER 20 MEQ PO TBCR
40.0000 meq | EXTENDED_RELEASE_TABLET | ORAL | Status: AC
  Administered 2021-08-22: 40 meq via ORAL
  Filled 2021-08-22 (×2): qty 2

## 2021-08-22 MED ORDER — UNJURY CHOCOLATE CLASSIC POWDER: 2.0000 [oz_av] | Freq: Two times a day (BID) | ORAL | Status: DC

## 2021-08-22 MED ORDER — GUAIFENESIN ER 600 MG PO TB12
1200.0000 mg | ORAL_TABLET | Freq: Two times a day (BID) | ORAL | Status: DC
  Administered 2021-08-22 – 2021-08-29 (×8): 1200 mg via ORAL
  Filled 2021-08-22 (×13): qty 2

## 2021-08-22 MED ORDER — UNJURY CHOCOLATE CLASSIC POWDER: 2.0000 [oz_av] | Freq: Four times a day (QID) | ORAL | Status: DC

## 2021-08-22 NOTE — Progress Notes (Signed)
Chief Complaint/Subjective: CT scan yesterday showing resolution of abscess but some dilation of small intestine and small pleural effusions, continued cough, some nausea today  Objective: Vital signs in last 24 hours: Temp:  [97.9 F (36.6 C)-99.7 F (37.6 C)] 99.2 F (37.3 C) (02/19 0605) Pulse Rate:  [82-113] 88 (02/19 0605) Resp:  [17-18] 17 (02/19 0605) BP: (108-136)/(72-88) 118/72 (02/19 0605) SpO2:  [89 %-100 %] 89 % (02/19 0605) Last BM Date : 08/21/21 (Loose) Intake/Output from previous day: 02/18 0701 - 02/19 0700 In: 393 [IV Piggyback:393] Out: 60 [Drains:60] Intake/Output this shift: No intake/output data recorded.  PE: Gen: NAD Resp: +cough, nonlabored Card: RRR Abd: soft, slight distension  Lab Results:  Recent Labs    08/21/21 0826 08/22/21 0547  WBC 15.7* 17.0*  HGB 11.1* 10.6*  HCT 32.0* 31.1*  PLT 427* 441*   BMET Recent Labs    08/21/21 0826 08/22/21 0547  NA 133* 135  K 3.2* 3.4*  CL 98 96*  CO2 26 29  GLUCOSE 85 83  BUN <5* <5*  CREATININE 0.50 0.50  CALCIUM 8.2* 8.4*   PT/INR No results for input(s): LABPROT, INR in the last 72 hours. CMP     Component Value Date/Time   NA 135 08/22/2021 0547   K 3.4 (L) 08/22/2021 0547   CL 96 (L) 08/22/2021 0547   CO2 29 08/22/2021 0547   GLUCOSE 83 08/22/2021 0547   BUN <5 (L) 08/22/2021 0547   CREATININE 0.50 08/22/2021 0547   CALCIUM 8.4 (L) 08/22/2021 0547   PROT 6.9 08/16/2021 1554   ALBUMIN 2.6 (L) 08/18/2021 0622   AST 12 (L) 08/16/2021 1554   ALT 7 08/16/2021 1554   ALKPHOS 44 08/16/2021 1554   BILITOT 0.5 08/16/2021 1554   GFRNONAA >60 08/22/2021 0547   Lipase     Component Value Date/Time   LIPASE 28 08/16/2021 1554    Studies/Results: CT ABDOMEN PELVIS W CONTRAST  Result Date: 08/21/2021 CLINICAL DATA:  Abdominal pain EXAM: CT ABDOMEN AND PELVIS WITH CONTRAST TECHNIQUE: Multidetector CT imaging of the abdomen and pelvis was performed using the standard protocol  following bolus administration of intravenous contrast. RADIATION DOSE REDUCTION: This exam was performed according to the departmental dose-optimization program which includes automated exposure control, adjustment of the mA and/or kV according to patient size and/or use of iterative reconstruction technique. CONTRAST:  135m OMNIPAQUE IOHEXOL 300 MG/ML  SOLN COMPARISON:  08/16/2021 FINDINGS: Lower chest: Coronary artery calcifications are seen. There is interval appearance of small bilateral pleural effusions. There are new infiltrates in the posterior lower lung fields with air bronchograms on both sides suggesting atelectasis/pneumonia. There are new patchy ground-glass infiltrates in the right middle lobe right upper lobe and to a lesser extent in the anterior left upper lobe. Hepatobiliary: Liver measures 18.9 cm in length. No focal abnormality is seen. Gallbladder is distended. There is no wall thickening or pericholecystic fluid collection. There is slight prominence of bile ducts. Pancreas: No focal abnormality is seen. Spleen: Unremarkable. Adrenals/Urinary Tract: Adrenals are unremarkable. There is no hydronephrosis. There are no renal or ureteral stones. Stomach/Bowel: Small hiatal hernia is seen. Stomach is unremarkable. There is dilation of proximal small-bowel loops measuring up to 4.3 cm. There is no significant dilation of distal small bowel loops. Appendix is not distinctly visualized. There is fluid in the lumen of right colon. There is no significant wall thickening in colon. There is interval placement of pigtail drainage catheter in the posterior pelvis  with almost complete resolution of large loculated fluid collection in the cul-de-sac. Small residual pocket of fluid is seen in the right side of pelvis measuring proximally 2.4 x 0.8 cm. Vascular/Lymphatic: Scattered arterial calcifications are seen. Reproductive: Unremarkable. Other: There is no ascites or pneumoperitoneum. There is edema in  the subcutaneous plane. Small umbilical hernia containing fat is seen. Musculoskeletal: Unremarkable. IMPRESSION: There is dilation of proximal small bowel loops suggesting possible ileus. Less likely possibility would be partial small bowel obstruction. There is no ascites or pneumoperitoneum. There is no hydronephrosis. There is almost complete resolution of loculated fluid collection in cul-de-sac in pelvis after placement of drainage catheter. There is small residual linear 2.4 x 0.8 cm loculated fluid collection in the right pelvic cavity. Enlarged liver. Gallbladder is distended, possibly due to fasting state. If there is clinical suspicion for acute cholecystitis, gallbladder sonogram may be considered. Small hiatal hernia. Small bilateral pleural effusions. There are dense infiltrates in both lower lobes suggesting atelectasis/pneumonia. There are new faint ground-glass infiltrates in the right middle lobe, right upper lobe and left upper lobe suggesting multifocal pneumonia. Other findings as described in the body of the report. Electronically Signed   By: Elmer Picker M.D.   On: 08/21/2021 16:01   DG CHEST PORT 1 VIEW  Result Date: 08/20/2021 CLINICAL DATA:  New pelvic drain placement 2 days ago. Persistent cough for 2 months. EXAM: PORTABLE CHEST 1 VIEW COMPARISON:  Chest radiographs 04/28/2021.  Abdominal CT 08/16/2021. FINDINGS: 1019 hours. Low lung volumes. The heart size and mediastinal contours are stable. There are patchy opacities at both lung bases which have mildly increased from the recent CT and likely represent atelectasis. There are possible small bilateral pleural effusions. No confluent airspace opacity or pneumothorax. The bones appear unremarkable. IMPRESSION: Increased bibasilar atelectasis and possible small bilateral pleural effusions compared with recent abdominal CT. No confluent airspace opacity. Electronically Signed   By: Richardean Sale M.D.   On: 08/20/2021 10:38     Anti-infectives: Anti-infectives (From admission, onward)    Start     Dose/Rate Route Frequency Ordered Stop   08/21/21 1000  anidulafungin (ERAXIS) 100 mg in sodium chloride 0.9 % 100 mL IVPB        100 mg 78 mL/hr over 100 Minutes Intravenous Every 24 hours 08/20/21 0724     08/20/21 0900  anidulafungin (ERAXIS) 200 mg in sodium chloride 0.9 % 200 mL IVPB        200 mg 78 mL/hr over 200 Minutes Intravenous  Once 08/20/21 0722 08/20/21 1347   08/17/21 1730  hydroxychloroquine (PLAQUENIL) tablet 200 mg  Status:  Discontinued        200 mg Oral Daily 08/17/21 1630 08/20/21 1225   08/16/21 1815  piperacillin-tazobactam (ZOSYN) IVPB 3.375 g        3.375 g 12.5 mL/hr over 240 Minutes Intravenous Every 8 hours 08/16/21 1805         Assessment/Plan Pelvic abscess Chronic peritonitis - Infectious vs inflammatory enteritis vs appendicitis, possible diverticulitis vs crohn's Leukocytosis  - CT showing: "1. Constellation of findings, most consistent with an inflammatory lower abdominal and pelvic process, including developing cul-de-sac abscess. Question if this represents infectious or inflammatory enteritis versus appendicitis. 2. Mild small bowel dilatation, suggesting secondary adynamic ileus." - WBC 17, afebrile, continue IV abx - lactic acid normal 2/13 - IR transgluteal drain 2/15 - 15 ml purulent fluid aspirated with placement. Nothing charted last 24h but patient notes was recently emptied. More purulence aspirated  by IR yesterday with bedside flushing - continue IV abx and follow drain culture - e coli, with several sensitivities, recommend continued broad spectrum given increasing WBC - add eraxis - cough - present since PTA. Lasix today   - No urgent/emergent surgery recommended at this time and favor continued IV abx, drain, and repeat imaging. Monitor abdominal exam and labs and should she acutely worsen then operative intervention may become acutely necessary. Continue to  plan for colonoscopy outpatient once inflammation resolved pending acute course -CT scan 2/18 negative for abscess   FEN: full liquids ID: zosyn 2/13 >>, eraxis 2/17>> VTE: lovenox   Lupus - on plaquenil hypothyroidism    LOS: 5 days   I reviewed last 24 h vitals and pain scores, last 48 h intake and output, last 24 h labs and trends, and last 24 h imaging results.  This care required high  level of medical decision making.   [facility] 08/22/2021, 7:59 AM Please see Amion for pager number during day hours 7:00am-4:30pm or 7:00am -11:30am on weekends

## 2021-08-22 NOTE — Progress Notes (Signed)
PROGRESS NOTE    Deanna Fischer  SJG:[id number] DOB: Jul 31, 1960 DOA: 08/16/2021 PCP: Patient, No Pcp Per (Inactive)     Brief Narrative:    Deanna Fischer is a 61 y.o. female with medical history significant of hypothyroidism, SLE, depression with anxiety, claustrophobia, PTSD, fibromyositis who is coming to the emergency department with complaints of abdominal pain since last week associated with multiple episodes of emesis, diarrhea   Subjective:   WBC continue to trend up, there is no fever,  She has some dry cough, no chest pain, she is on room air  She cannot eat much due to feeling nauseous, and pain seems has improved, + flatus, + bm Ab Drain in place, drain output seems has decreased She has not been out of bed much yet   Assessment & Plan:  Principal Problem:   Enteritis Active Problems:   Hypothyroidism   Hyponatremia   Hypokalemia   Normocytic anemia   Depression with anxiety   Hypophosphatemia    Assessment and Plan:   Pelvic abscess, present on admission -Unclear source --s/p CT-guided drain/catheter placement on 2/15 --Currently on Zosyn, on antifungal, WBC remains elevated -General surgery and interventional radiology consulted, will follow recommendation   Hyponatremia, present on admission Likely due to dehydration, sodium has normalized after hydration  hypokalemia/hypophosphatemia, present on admission Likely due to poor oral intake Improved ,continue replace as needed Recheck in the morning  SLE Continue hydroxychloroquine Follows with [facility]ology  Hypothyroidism Continue Synthroid supplement   Body mass index is 20.8 kg/m..    I have Reviewed nursing notes, Vitals, and Lab results since pt's last encounter. Pertinent lab results CBC, BMP, detail please see discussion above  I ordered the following labs:  Unresulted Labs (From admission, onward)     Start     Ordered   08/23/21 0500  CBC  Every Mon-Wed-Fri (0500),    R      08/20/21 0807   08/23/21 6546  Basic metabolic panel  Daily,   R      08/22/21 1514   08/23/21 0500  Magnesium  Tomorrow morning,   STAT        08/22/21 1514   08/23/21 0500  Phosphorus  Tomorrow morning,   R        08/22/21 1514             DVT prophylaxis: Place and maintain sequential compression device Start: 08/19/21 0801 SCDs Start: 08/17/21 1328   Code Status:   Code Status: Full Code  Family Communication: Patient Disposition:   Status is: Inpatient  Dispo: The patient is from: Home              Anticipated d/c is to: Home              Anticipated d/c date is: TBD  Antimicrobials:    Anti-infectives (From admission, onward)    Start     Dose/Rate Route Frequency Ordered Stop   08/21/21 1000  anidulafungin (ERAXIS) 100 mg in sodium chloride 0.9 % 100 mL IVPB        100 mg 78 mL/hr over 100 Minutes Intravenous Every 24 hours 08/20/21 0724     08/20/21 0900  anidulafungin (ERAXIS) 200 mg in sodium chloride 0.9 % 200 mL IVPB        200 mg 78 mL/hr over 200 Minutes Intravenous  Once 08/20/21 0722 08/20/21 1347   08/17/21 1730  hydroxychloroquine (PLAQUENIL) tablet 200 mg  Status:  Discontinued  200 mg Oral Daily 08/17/21 1630 08/20/21 1225   08/16/21 1815  piperacillin-tazobactam (ZOSYN) IVPB 3.375 g        3.375 g 12.5 mL/hr over 240 Minutes Intravenous Every 8 hours 08/16/21 1805            Objective: Vitals:   08/21/21 1740 08/21/21 2232 08/22/21 0605 08/22/21 1329  BP: 136/88 120/76 118/72 118/89  Pulse: 88 88 88 84  Resp: 18 17 17 17   Temp: 97.9 F (36.6 C) 98.6 F (37 C) 99.2 F (37.3 C) 98.7 F (37.1 C)  TempSrc: Oral Oral Oral Oral  SpO2: (!) 89% 92% (!) 89% 95%  Weight:      Height:        Intake/Output Summary (Last 24 hours) at 08/22/2021 1604 Last data filed at 08/22/2021 0838 Gross per 24 hour  Intake 772.95 ml  Output 40 ml  Net 732.95 ml   Filed Weights   08/16/21 1539  Weight: 56.7 kg     Examination:  General exam: alert, awake, communicative,calm, NAD Respiratory system: Clear to auscultation. Respiratory effort normal. Cardiovascular system:  RRR.  Gastrointestinal system: Abdomen is more distended , tight, mild diffuse tender, decreased bowel sounds, . + drain Central nervous system: Alert and oriented. No focal neurological deficits. Extremities:  no edema Skin: No rashes, lesions or ulcers Psychiatry: Judgement and insight appear normal. Mood & affect appropriate.     Data Reviewed: I have personally reviewed  labs and visualized  imaging studies since the last encounter and formulate the plan        Scheduled Meds:  acetaminophen  1,000 mg Oral Q6H   buPROPion  300 mg Oral Daily   docusate sodium  100 mg Oral Daily   escitalopram  10 mg Oral Daily   feeding supplement  237 mL Oral BID BM   furosemide  40 mg Intravenous Q12H   guaiFENesin  1,200 mg Oral BID   levothyroxine  100 mcg Oral Q0600   pantoprazole  40 mg Oral Q1200   potassium chloride  40 mEq Oral Q4H   protein supplement  2 oz Oral BID   sodium chloride flush  10 mL Intracatheter Q12H   Continuous Infusions:  sodium chloride     anidulafungin 100 mg (08/22/21 1221)   methocarbamol (ROBAXIN) IV 500 mg (08/22/21 1223)   piperacillin-tazobactam (ZOSYN)  IV 3.375 g (08/22/21 1226)     LOS: 5 days      Florencia Reasons, MD PhD FACP [facility]  Available via Epic secure chat 7am-7pm for nonurgent issues Please page for urgent issues To page the attending provider between 7A-7P or the covering provider during after hours 7P-7A, please log into the web site [email or web address] and access using universal [place] password for that web site. If you do not have the password, please call the hospital operator.    08/22/2021, 4:04 PM

## 2021-08-23 ENCOUNTER — Inpatient Hospital Stay (HOSPITAL_COMMUNITY): Payer: Medicare Other

## 2021-08-23 DIAGNOSIS — K529 Noninfective gastroenteritis and colitis, unspecified: Secondary | ICD-10-CM | POA: Diagnosis not present

## 2021-08-23 LAB — CBC
HCT: 29.2 % — ABNORMAL LOW (ref 36.0–46.0)
Hemoglobin: 10.3 g/dL — ABNORMAL LOW (ref 12.0–15.0)
MCH: 33.3 pg (ref 26.0–34.0)
MCHC: 35.3 g/dL (ref 30.0–36.0)
MCV: 94.5 fL (ref 80.0–100.0)
Platelets: 468 10*3/uL — ABNORMAL HIGH (ref 150–400)
RBC: 3.09 MIL/uL — ABNORMAL LOW (ref 3.87–5.11)
RDW: 13 % (ref 11.5–15.5)
WBC: 15.2 10*3/uL — ABNORMAL HIGH (ref 4.0–10.5)
nRBC: 0 % (ref 0.0–0.2)

## 2021-08-23 LAB — BASIC METABOLIC PANEL
Anion gap: 9 (ref 5–15)
BUN: <5 mg/dL — ABNORMAL LOW (ref 6–20)
CO2: 29 mmol/L (ref 22–32)
Calcium: 7.9 mg/dL — ABNORMAL LOW (ref 8.9–10.3)
Chloride: 94 mmol/L — ABNORMAL LOW (ref 98–111)
Creatinine, Ser: 0.37 mg/dL — ABNORMAL LOW (ref 0.44–1.00)
GFR, Estimated: >60 mL/min (ref >60)
Glucose, Bld: 93 mg/dL (ref 70–99)
Potassium: 2.8 mmol/L — ABNORMAL LOW (ref 3.5–5.1)
Sodium: 132 mmol/L — ABNORMAL LOW (ref 135–145)

## 2021-08-23 LAB — GLUCOSE, CAPILLARY
Glucose-Capillary: 104 mg/dL — ABNORMAL HIGH (ref 70–99)
Glucose-Capillary: 115 mg/dL — ABNORMAL HIGH (ref 70–99)
Glucose-Capillary: 139 mg/dL — ABNORMAL HIGH (ref 70–99)
Glucose-Capillary: 89 mg/dL (ref 70–99)

## 2021-08-23 LAB — PHOSPHORUS: Phosphorus: 2.9 mg/dL (ref 2.5–4.6)

## 2021-08-23 LAB — MAGNESIUM: Magnesium: 1.7 mg/dL (ref 1.7–2.4)

## 2021-08-23 MED ORDER — POTASSIUM CHLORIDE CRYS ER 20 MEQ PO TBCR
40.0000 meq | EXTENDED_RELEASE_TABLET | ORAL | Status: AC
  Administered 2021-08-23 (×2): 40 meq via ORAL
  Filled 2021-08-23 (×2): qty 2

## 2021-08-23 MED ORDER — MAGNESIUM SULFATE 2 GM/50ML IV SOLN
2.0000 g | Freq: Once | INTRAVENOUS | Status: AC
  Administered 2021-08-23: 2 g via INTRAVENOUS
  Filled 2021-08-23: qty 50

## 2021-08-23 MED ORDER — DOCUSATE SODIUM 100 MG PO CAPS
100.0000 mg | ORAL_CAPSULE | Freq: Two times a day (BID) | ORAL | Status: DC | PRN
  Administered 2021-08-24 (×2): 100 mg via ORAL
  Filled 2021-08-23 (×2): qty 1

## 2021-08-23 MED ORDER — IOHEXOL 300 MG/ML  SOLN
50.0000 mL | Freq: Once | INTRAMUSCULAR | Status: AC | PRN
  Administered 2021-08-23: 50 mL

## 2021-08-23 MED ORDER — SODIUM CHLORIDE 0.9 % IV SOLN
12.5000 mg | Freq: Four times a day (QID) | INTRAVENOUS | Status: DC | PRN
  Filled 2021-08-23: qty 0.5

## 2021-08-23 NOTE — Progress Notes (Signed)
Chief Complaint/Subjective: She has ongoing nausea but no emesis. Now having frequent loose stools. Did not take her potassium supplement or evening meds last night because she was feeling nauseas and unwell. She was able to eat a good amount of full liquids. She has a persistent cough - not worsening and pain with deep inspiration. She is ambulating  Objective: Vital signs in last 24 hours: Temp:  [98.7 F (37.1 C)-98.8 F (37.1 C)] 98.8 F (37.1 C) (02/19 2106) Pulse Rate:  [84-87] 87 (02/19 2106) Resp:  [17-18] 18 (02/19 2106) BP: (118-129)/(88-89) 129/88 (02/19 2106) SpO2:  [92 %-95 %] 92 % (02/19 2106) Last BM Date : 08/23/21 Intake/Output from previous day: 02/19 0701 - 02/20 0700 In: 840 [P.O.:840] Out: -  Intake/Output this shift: No intake/output data recorded.  PE: Gen: NAD Resp: +cough, nonlabored on room air Card: RRR Abd: soft, slight distension MSK: no edema, calves soft and NT bilaterally  Lab Results:  Recent Labs    08/22/21 0547 08/23/21 0540  WBC 17.0* 15.2*  HGB 10.6* 10.3*  HCT 31.1* 29.2*  PLT 441* 468*    BMET Recent Labs    08/22/21 0547 08/23/21 0540  NA 135 132*  K 3.4* 2.8*  CL 96* 94*  CO2 29 29  GLUCOSE 83 93  BUN <5* <5*  CREATININE 0.50 0.37*  CALCIUM 8.4* 7.9*    PT/INR No results for input(s): LABPROT, INR in the last 72 hours. CMP     Component Value Date/Time   NA 132 (L) 08/23/2021 0540   K 2.8 (L) 08/23/2021 0540   CL 94 (L) 08/23/2021 0540   CO2 29 08/23/2021 0540   GLUCOSE 93 08/23/2021 0540   BUN <5 (L) 08/23/2021 0540   CREATININE 0.37 (L) 08/23/2021 0540   CALCIUM 7.9 (L) 08/23/2021 0540   PROT 6.9 08/16/2021 1554   ALBUMIN 2.6 (L) 08/18/2021 0622   AST 12 (L) 08/16/2021 1554   ALT 7 08/16/2021 1554   ALKPHOS 44 08/16/2021 1554   BILITOT 0.5 08/16/2021 1554   GFRNONAA >60 08/23/2021 0540   Lipase     Component Value Date/Time   LIPASE 28 08/16/2021 1554    Studies/Results: CT ABDOMEN  PELVIS W CONTRAST  Result Date: 08/21/2021 CLINICAL DATA:  Abdominal pain EXAM: CT ABDOMEN AND PELVIS WITH CONTRAST TECHNIQUE: Multidetector CT imaging of the abdomen and pelvis was performed using the standard protocol following bolus administration of intravenous contrast. RADIATION DOSE REDUCTION: This exam was performed according to the departmental dose-optimization program which includes automated exposure control, adjustment of the mA and/or kV according to patient size and/or use of iterative reconstruction technique. CONTRAST:  132m OMNIPAQUE IOHEXOL 300 MG/ML  SOLN COMPARISON:  08/16/2021 FINDINGS: Lower chest: Coronary artery calcifications are seen. There is interval appearance of small bilateral pleural effusions. There are new infiltrates in the posterior lower lung fields with air bronchograms on both sides suggesting atelectasis/pneumonia. There are new patchy ground-glass infiltrates in the right middle lobe right upper lobe and to a lesser extent in the anterior left upper lobe. Hepatobiliary: Liver measures 18.9 cm in length. No focal abnormality is seen. Gallbladder is distended. There is no wall thickening or pericholecystic fluid collection. There is slight prominence of bile ducts. Pancreas: No focal abnormality is seen. Spleen: Unremarkable. Adrenals/Urinary Tract: Adrenals are unremarkable. There is no hydronephrosis. There are no renal or ureteral stones. Stomach/Bowel: Small hiatal hernia is seen. Stomach is unremarkable. There is dilation of proximal small-bowel loops measuring  up to 4.3 cm. There is no significant dilation of distal small bowel loops. Appendix is not distinctly visualized. There is fluid in the lumen of right colon. There is no significant wall thickening in colon. There is interval placement of pigtail drainage catheter in the posterior pelvis with almost complete resolution of large loculated fluid collection in the cul-de-sac. Small residual pocket of fluid is seen  in the right side of pelvis measuring proximally 2.4 x 0.8 cm. Vascular/Lymphatic: Scattered arterial calcifications are seen. Reproductive: Unremarkable. Other: There is no ascites or pneumoperitoneum. There is edema in the subcutaneous plane. Small umbilical hernia containing fat is seen. Musculoskeletal: Unremarkable. IMPRESSION: There is dilation of proximal small bowel loops suggesting possible ileus. Less likely possibility would be partial small bowel obstruction. There is no ascites or pneumoperitoneum. There is no hydronephrosis. There is almost complete resolution of loculated fluid collection in cul-de-sac in pelvis after placement of drainage catheter. There is small residual linear 2.4 x 0.8 cm loculated fluid collection in the right pelvic cavity. Enlarged liver. Gallbladder is distended, possibly due to fasting state. If there is clinical suspicion for acute cholecystitis, gallbladder sonogram may be considered. Small hiatal hernia. Small bilateral pleural effusions. There are dense infiltrates in both lower lobes suggesting atelectasis/pneumonia. There are new faint ground-glass infiltrates in the right middle lobe, right upper lobe and left upper lobe suggesting multifocal pneumonia. Other findings as described in the body of the report. Electronically Signed   By: Elmer Picker M.D.   On: 08/21/2021 16:01    Anti-infectives: Anti-infectives (From admission, onward)    Start     Dose/Rate Route Frequency Ordered Stop   08/21/21 1000  anidulafungin (ERAXIS) 100 mg in sodium chloride 0.9 % 100 mL IVPB        100 mg 78 mL/hr over 100 Minutes Intravenous Every 24 hours 08/20/21 0724     08/20/21 0900  anidulafungin (ERAXIS) 200 mg in sodium chloride 0.9 % 200 mL IVPB        200 mg 78 mL/hr over 200 Minutes Intravenous  Once 08/20/21 0722 08/20/21 1347   08/17/21 1730  hydroxychloroquine (PLAQUENIL) tablet 200 mg  Status:  Discontinued        200 mg Oral Daily 08/17/21 1630 08/20/21  1225   08/16/21 1815  piperacillin-tazobactam (ZOSYN) IVPB 3.375 g        3.375 g 12.5 mL/hr over 240 Minutes Intravenous Every 8 hours 08/16/21 1805         Assessment/Plan Pelvic abscess Chronic peritonitis - Infectious vs inflammatory enteritis vs appendicitis, possible diverticulitis vs crohn's Leukocytosis  - CT showing: "1. Constellation of findings, most consistent with an inflammatory lower abdominal and pelvic process, including developing cul-de-sac abscess. Question if this represents infectious or inflammatory enteritis versus appendicitis. 2. Mild small bowel dilatation, suggesting secondary adynamic ileus." - WBC 15.2 (17), afebrile, continue IV abx - lactic acid normal 2/13 - IR transgluteal drain 2/15 - 15 ml purulent fluid aspirated with placement. Nothing charted last 24h but patient reports low output - continue IV abx and follow drain culture - e coli, with several sensitivities, recommend continued broad spectrum given increasing WBC. If output truly declining could discuss with IR possible drain study while here and removal  - eraxis added 2/17 - cough - present since PTA. Lasix 2/19 - hypokalemia - replete PO   - No urgent/emergent surgery recommended at this time and favor continued IV abx, drain, and repeat imaging. Monitor abdominal exam and labs and  should she acutely worsen then operative intervention may become acutely necessary. Continue to plan for colonoscopy outpatient once inflammation resolved pending acute course -CT scan 2/18 negative for abscess   FEN: full liquids ID: zosyn 2/13 >>, eraxis 2/17>> VTE: lovenox   Lupus - on plaquenil hypothyroidism   Dispo: - ongoing nausea and now with frequent loose Bms. Continue FLD for now. Stop stool softener    LOS: 6 days   I reviewed hospitalist notes, last 24 h vitals and pain scores, last 48 h intake and output, last 24 h labs and trends, and last 24 h imaging results.  This care required high   level of medical decision making.   [facility] 08/23/2021, 8:39 AM Please see Amion for pager number during day hours 7:00am-4:30pm or 7:00am -11:30am on weekends

## 2021-08-23 NOTE — Progress Notes (Signed)
PROGRESS NOTE    Deanna Fischer  WCB:[id number] DOB: 04-13-1961 DOA: 08/16/2021 PCP: Patient, No Pcp Per (Inactive)     Brief Narrative:    Lavetta Geier is a 61 y.o. female with medical history significant of hypothyroidism, SLE, depression with anxiety, claustrophobia, PTSD, fibromyositis who is coming to the emergency department with complaints of abdominal pain since last week associated with multiple episodes of emesis, diarrhea. Found to have a pelvic abscess s/p drain placement by IR.  Culture with E. Coli, good sensitivity.  Worsening leukocytosis which started trending down today. On Zosyn and antifungal.  Subjective: Was able to tolerate full liquid diet but continued to have some nausea.  Having some loose bowel movements.  Complaining about IV site hurting.  Drain secretions improving.  Assessment & Plan:  Principal Problem:   Enteritis Active Problems:   Hypothyroidism   Hyponatremia   Hypokalemia   Normocytic anemia   Depression with anxiety   Hypophosphatemia   Assessment and Plan:  Pelvic abscess, present on admission -Unclear source --s/p CT-guided drain/catheter placement on 2/15 --Currently on Zosyn, on antifungal, WBC remains elevated, some improvement today to 15.2. -Repeat CT abdomen on 2/18 was negative for any abscess. -Appreciate general surgery recommendations -Continue current antibiotics. -Follow-up final blood cultures  Hyponatremia, present on admission Likely due to dehydration, sodium is improving. -Continue to monitor  Hypokalemia/hypophosphatemia/hypomagnesemia.  Present on admission Likely due to poor oral intake.  Continue to have hypokalemia with magnesium of 1.7 today. -Replete electrolytes and monitor  SLE Continue hydroxychloroquine Follows with [facility]ology  Hypothyroidism Continue Synthroid    Body mass index is 20.8 kg/m..    I have Reviewed nursing notes, Vitals, and Lab results since pt's last  encounter. Pertinent lab results CBC, BMP, detail please see discussion above  I ordered the following labs:  Unresulted Labs (From admission, onward)     Start     Ordered   08/24/21 0500  Magnesium  Tomorrow morning,   R        08/23/21 0807   08/24/21 0500  CBC  Tomorrow morning,   R        08/23/21 0807   08/23/21 0500  CBC  Every Mon-Wed-Fri (0500),   R      08/20/21 0807   08/23/21 6160  Basic metabolic panel  Daily,   R      08/22/21 1514             DVT prophylaxis: Place and maintain sequential compression device Start: 08/19/21 0801 SCDs Start: 08/17/21 1328   Code Status:   Code Status: Full Code  Family Communication: Discussed with patient Disposition:   Status is: Inpatient  Dispo: The patient is from: Home              Anticipated d/c is to: Home              Anticipated d/c date is: In 1 to 2 days if remains stable and improving. Antimicrobials:    Anti-infectives (From admission, onward)    Start     Dose/Rate Route Frequency Ordered Stop   08/21/21 1000  anidulafungin (ERAXIS) 100 mg in sodium chloride 0.9 % 100 mL IVPB        100 mg 78 mL/hr over 100 Minutes Intravenous Every 24 hours 08/20/21 0724     08/20/21 0900  anidulafungin (ERAXIS) 200 mg in sodium chloride 0.9 % 200 mL IVPB        200 mg 78 mL/hr over  200 Minutes Intravenous  Once 08/20/21 0722 08/20/21 1347   08/17/21 1730  hydroxychloroquine (PLAQUENIL) tablet 200 mg  Status:  Discontinued        200 mg Oral Daily 08/17/21 1630 08/20/21 1225   08/16/21 1815  piperacillin-tazobactam (ZOSYN) IVPB 3.375 g        3.375 g 12.5 mL/hr over 240 Minutes Intravenous Every 8 hours 08/16/21 1805            Objective: Vitals:   08/21/21 2232 08/22/21 0605 08/22/21 1329 08/22/21 2106  BP: 120/76 118/72 118/89 129/88  Pulse: 88 88 84 87  Resp: 17 17 17 18   Temp: 98.6 F (37 C) 99.2 F (37.3 C) 98.7 F (37.1 C) 98.8 F (37.1 C)  TempSrc: Oral Oral Oral Oral  SpO2: 92% (!) 89% 95%  92%  Weight:      Height:        Intake/Output Summary (Last 24 hours) at 08/23/2021 1502 Last data filed at 08/23/2021 1155 Gross per 24 hour  Intake 600 ml  Output --  Net 600 ml    Filed Weights   08/16/21 1539  Weight: 56.7 kg    Examination:  General.  Well-developed lady, in no acute distress. Pulmonary.  Lungs clear bilaterally, normal respiratory effort. CV.  Regular rate and rhythm, no JVD, rub or murmur. Abdomen.  Soft, nontender, nondistended, BS positive.  Pelvic drain in place CNS.  Alert and oriented.  No focal neurologic deficit. Extremities.  No edema, no cyanosis, pulses intact and symmetrical. Psychiatry.  Judgment and insight appears normal.    Data Reviewed: I have personally reviewed  labs and visualized  imaging studies since the last encounter and formulate the plan    Scheduled Meds:  acetaminophen  1,000 mg Oral Q6H   buPROPion  300 mg Oral Daily   escitalopram  10 mg Oral Daily   feeding supplement  237 mL Oral BID BM   guaiFENesin  1,200 mg Oral BID   levothyroxine  100 mcg Oral Q0600   pantoprazole  40 mg Oral Q1200   protein supplement  2 oz Oral BID   sodium chloride flush  10 mL Intracatheter Q12H   Continuous Infusions:  sodium chloride     anidulafungin 100 mg (08/23/21 1101)   methocarbamol (ROBAXIN) IV 500 mg (08/22/21 1223)   piperacillin-tazobactam (ZOSYN)  IV 3.375 g (08/23/21 1249)   promethazine (PHENERGAN) injection (IM or IVPB)       LOS: 6 days   This record has been created using Systems analyst. Errors have been sought and corrected,but may not always be located. Such creation errors do not reflect on the standard of care.    Lorella Nimrod, MD [facility]  Available via Epic secure chat 7am-7pm for nonurgent issues Please page for urgent issues To page the attending provider between 7A-7P or the covering provider during after hours 7P-7A, please log into the web site [email or web address] and  access using universal [place] password for that web site. If you do not have the password, please call the hospital operator.    08/23/2021, 3:02 PM

## 2021-08-23 NOTE — Progress Notes (Signed)
Referring Physician(s): Gross,S  Supervising Physician: Michaelle Birks  Patient Status:  Deanna Fischer - In-pt  Chief Complaint: Pelvic abscess   Subjective: Pt with intermittent nausea, occ cough; denies worsening pelvic pain   Allergies: Patient has no known allergies.  Medications: Prior to Admission medications   Medication Sig Start Date End Date Taking? Authorizing Provider  buPROPion (WELLBUTRIN XL) 300 MG 24 hr tablet Take 300 mg by mouth daily. 07/29/21  Yes [provider]  escitalopram (LEXAPRO) 10 MG tablet Take 10 mg by mouth daily. 07/17/21  Yes [provider]  hydroxychloroquine (PLAQUENIL) 200 MG tablet Take 200 mg by mouth daily.   Yes [provider]  ibuprofen (ADVIL) 200 MG tablet Take 200 mg by mouth every 6 (six) hours as needed.   Yes [provider]  levothyroxine (SYNTHROID, LEVOTHROID) 100 MCG tablet Take 100 mcg by mouth daily before breakfast.   Yes [provider]  ondansetron (ZOFRAN ODT) 4 MG disintegrating tablet Take 1 tablet (4 mg total) by mouth every 8 (eight) hours as needed for nausea or vomiting. Patient not taking: Reported on 08/17/2021 02/15/21   Claud Kelp, MD     Vital Signs: BP 129/88 (BP Location: Right Arm)    Pulse 87    Temp 98.8 F (37.1 C) (Oral)    Resp 18    Ht 5\' 5"  (1.651 m)    Wt 125 lb (56.7 kg)    SpO2 92%    BMI 20.80 kg/m   Physical Exam awake/alert; TG drain intact, OP minimal amount yellow fluid in JP bulb  Imaging: CT ABDOMEN PELVIS W CONTRAST  Result Date: 08/21/2021 CLINICAL DATA:  Abdominal pain EXAM: CT ABDOMEN AND PELVIS WITH CONTRAST TECHNIQUE: Multidetector CT imaging of the abdomen and pelvis was performed using the standard protocol following bolus administration of intravenous contrast. RADIATION DOSE REDUCTION: This exam was performed according to the departmental dose-optimization program which includes automated exposure control, adjustment of the mA and/or kV  according to patient size and/or use of iterative reconstruction technique. CONTRAST:  169mL OMNIPAQUE IOHEXOL 300 MG/ML  SOLN COMPARISON:  08/16/2021 FINDINGS: Lower chest: Coronary artery calcifications are seen. There is interval appearance of small bilateral pleural effusions. There are new infiltrates in the posterior lower lung fields with air bronchograms on both sides suggesting atelectasis/pneumonia. There are new patchy ground-glass infiltrates in the right middle lobe right upper lobe and to a lesser extent in the anterior left upper lobe. Hepatobiliary: Liver measures 18.9 cm in length. No focal abnormality is seen. Gallbladder is distended. There is no wall thickening or pericholecystic fluid collection. There is slight prominence of bile ducts. Pancreas: No focal abnormality is seen. Spleen: Unremarkable. Adrenals/Urinary Tract: Adrenals are unremarkable. There is no hydronephrosis. There are no renal or ureteral stones. Stomach/Bowel: Small hiatal hernia is seen. Stomach is unremarkable. There is dilation of proximal small-bowel loops measuring up to 4.3 cm. There is no significant dilation of distal small bowel loops. Appendix is not distinctly visualized. There is fluid in the lumen of right colon. There is no significant wall thickening in colon. There is interval placement of pigtail drainage catheter in the posterior pelvis with almost complete resolution of large loculated fluid collection in the cul-de-sac. Small residual pocket of fluid is seen in the right side of pelvis measuring proximally 2.4 x 0.8 cm. Vascular/Lymphatic: Scattered arterial calcifications are seen. Reproductive: Unremarkable. Other: There is no ascites or pneumoperitoneum. There is edema in the subcutaneous plane. Small umbilical hernia  containing fat is seen. Musculoskeletal: Unremarkable. IMPRESSION: There is dilation of proximal small bowel loops suggesting possible ileus. Less likely possibility would be partial small  bowel obstruction. There is no ascites or pneumoperitoneum. There is no hydronephrosis. There is almost complete resolution of loculated fluid collection in cul-de-sac in pelvis after placement of drainage catheter. There is small residual linear 2.4 x 0.8 cm loculated fluid collection in the right pelvic cavity. Enlarged liver. Gallbladder is distended, possibly due to fasting state. If there is clinical suspicion for acute cholecystitis, gallbladder sonogram may be considered. Small hiatal hernia. Small bilateral pleural effusions. There are dense infiltrates in both lower lobes suggesting atelectasis/pneumonia. There are new faint ground-glass infiltrates in the right middle lobe, right upper lobe and left upper lobe suggesting multifocal pneumonia. Other findings as described in the body of the report. Electronically Signed   By: Elmer Picker M.D.   On: 08/21/2021 16:01   DG CHEST PORT 1 VIEW  Result Date: 08/20/2021 CLINICAL DATA:  New pelvic drain placement 2 days ago. Persistent cough for 2 months. EXAM: PORTABLE CHEST 1 VIEW COMPARISON:  Chest radiographs 04/28/2021.  Abdominal CT 08/16/2021. FINDINGS: 1019 hours. Low lung volumes. The heart size and mediastinal contours are stable. There are patchy opacities at both lung bases which have mildly increased from the recent CT and likely represent atelectasis. There are possible small bilateral pleural effusions. No confluent airspace opacity or pneumothorax. The bones appear unremarkable. IMPRESSION: Increased bibasilar atelectasis and possible small bilateral pleural effusions compared with recent abdominal CT. No confluent airspace opacity. Electronically Signed   By: Richardean Sale M.D.   On: 08/20/2021 10:38    Labs:  CBC: Recent Labs    08/20/21 0529 08/21/21 0826 08/22/21 0547 08/23/21 0540  WBC 14.6* 15.7* 17.0* 15.2*  HGB 10.9* 11.1* 10.6* 10.3*  HCT 31.7* 32.0* 31.1* 29.2*  PLT 375 427* 441* 468*    COAGS: Recent Labs     08/18/21 0622  INR 1.1    BMP: Recent Labs    08/20/21 0529 08/21/21 0826 08/22/21 0547 08/23/21 0540  NA 132* 133* 135 132*  K 2.9* 3.2* 3.4* 2.8*  CL 97* 98 96* 94*  CO2 27 26 29 29   GLUCOSE 105* 85 83 93  BUN <5* <5* <5* <5*  CALCIUM 8.4* 8.2* 8.4* 7.9*  CREATININE 0.45 0.50 0.50 0.37*  GFRNONAA >60 >60 >60 >60    LIVER FUNCTION TESTS: Recent Labs    08/16/21 1554 08/17/21 1353 08/18/21 0622  BILITOT 0.5  --   --   AST 12*  --   --   ALT 7  --   --   ALKPHOS 44  --   --   PROT 6.9  --   --   ALBUMIN 3.7 2.9* 2.6*    Assessment and Plan: 61 y.o. female ex-smoker with past medical history significant for lupus, arthritis, anxiety disorder, and hypothyroidism ; now with pelvic pain, leukocytosis, pelvic abscess, s/p 12 fr drain placement via TG approach on 08/18/21; drain to JP bulb; afebrile; wbc 15.2(17), hgb stable; creat 0.37,K 2.8- replace; f/u CT 2/18:   There is dilation of proximal small bowel loops suggesting possible ileus. Less likely possibility would be partial small bowel obstruction. There is no ascites or pneumoperitoneum. There is no hydronephrosis.   There is almost complete resolution of loculated fluid collection in cul-de-sac in pelvis after placement of drainage catheter. There is small residual linear 2.4 x 0.8 cm loculated fluid collection in the  right pelvic cavity.   Enlarged liver. Gallbladder is distended, possibly due to fasting state. If there is clinical suspicion for acute cholecystitis, gallbladder sonogram may be considered.   Small hiatal hernia. Small bilateral pleural effusions. There are dense infiltrates in both lower lobes suggesting atelectasis/pneumonia. There are new faint ground-glass infiltrates in the right middle lobe, right upper lobe and left upper lobe suggesting multifocal pneumonia.  Imaging studies were reviewed by Dr. Maryelizabeth Kaufmann; plan for drain injection today (schedule allowing) and possible removal if no  confirmed fistula  Electronically Signed: D. Rowe Robert, PA-C 08/23/2021, 2:15 PM   I spent a total of 15 minutes at the the patient's bedside AND on the patient's hospital floor or unit, greater than 50% of which was counseling/coordinating care for pelvic abscess drain    Patient ID: Pennye Beeghly, female   DOB: 1960-11-25, 61 y.o.   MRN: [id number]

## 2021-08-24 DIAGNOSIS — E878 Other disorders of electrolyte and fluid balance, not elsewhere classified: Secondary | ICD-10-CM

## 2021-08-24 DIAGNOSIS — N739 Female pelvic inflammatory disease, unspecified: Secondary | ICD-10-CM

## 2021-08-24 DIAGNOSIS — K659 Peritonitis, unspecified: Secondary | ICD-10-CM

## 2021-08-24 LAB — CBC
HCT: 32.6 % — ABNORMAL LOW (ref 36.0–46.0)
Hemoglobin: 11 g/dL — ABNORMAL LOW (ref 12.0–15.0)
MCH: 32.4 pg (ref 26.0–34.0)
MCHC: 33.7 g/dL (ref 30.0–36.0)
MCV: 95.9 fL (ref 80.0–100.0)
Platelets: 178 10*3/uL (ref 150–400)
RBC: 3.4 MIL/uL — ABNORMAL LOW (ref 3.87–5.11)
RDW: 13.2 % (ref 11.5–15.5)
WBC: 14.9 10*3/uL — ABNORMAL HIGH (ref 4.0–10.5)
nRBC: 0 % (ref 0.0–0.2)

## 2021-08-24 LAB — BASIC METABOLIC PANEL
Anion gap: 7 (ref 5–15)
BUN: <5 mg/dL — ABNORMAL LOW (ref 6–20)
CO2: 30 mmol/L (ref 22–32)
Calcium: 8.3 mg/dL — ABNORMAL LOW (ref 8.9–10.3)
Chloride: 96 mmol/L — ABNORMAL LOW (ref 98–111)
Creatinine, Ser: 0.47 mg/dL (ref 0.44–1.00)
GFR, Estimated: >60 mL/min (ref >60)
Glucose, Bld: 108 mg/dL — ABNORMAL HIGH (ref 70–99)
Potassium: 3.6 mmol/L (ref 3.5–5.1)
Sodium: 133 mmol/L — ABNORMAL LOW (ref 135–145)

## 2021-08-24 LAB — GLUCOSE, CAPILLARY
Glucose-Capillary: 102 mg/dL — ABNORMAL HIGH (ref 70–99)
Glucose-Capillary: 106 mg/dL — ABNORMAL HIGH (ref 70–99)
Glucose-Capillary: 107 mg/dL — ABNORMAL HIGH (ref 70–99)
Glucose-Capillary: 109 mg/dL — ABNORMAL HIGH (ref 70–99)

## 2021-08-24 LAB — MAGNESIUM: Magnesium: 2.1 mg/dL (ref 1.7–2.4)

## 2021-08-24 MED ORDER — ENOXAPARIN SODIUM 40 MG/0.4ML IJ SOSY
40.0000 mg | PREFILLED_SYRINGE | INTRAMUSCULAR | Status: DC
  Administered 2021-08-27: 40 mg via SUBCUTANEOUS
  Filled 2021-08-24 (×3): qty 0.4

## 2021-08-24 MED ORDER — MORPHINE SULFATE (PF) 2 MG/ML IV SOLN
2.0000 mg | INTRAVENOUS | Status: DC | PRN
  Administered 2021-08-24 – 2021-08-28 (×9): 2 mg via INTRAVENOUS
  Filled 2021-08-24 (×10): qty 1

## 2021-08-24 MED ORDER — TRAZODONE HCL 50 MG PO TABS: 50.0000 mg | ORAL_TABLET | Freq: Every evening | ORAL | Status: DC | PRN

## 2021-08-24 MED ORDER — OXYCODONE HCL 5 MG PO TABS
5.0000 mg | ORAL_TABLET | ORAL | Status: DC | PRN
  Administered 2021-08-24 – 2021-08-25 (×6): 10 mg via ORAL
  Administered 2021-08-26: 5 mg via ORAL
  Administered 2021-08-26 – 2021-08-29 (×11): 10 mg via ORAL
  Filled 2021-08-24 (×18): qty 2

## 2021-08-24 NOTE — Progress Notes (Addendum)
Chief Complaint/Subjective: Feeling better this am. No further nausea and bowel movements have slowed down. Tolerated full liquids well yesterday.  Objective: Vital signs in last 24 hours: Temp:  [98.1 F (36.7 C)-98.8 F (37.1 C)] 98.6 F (37 C) (02/21 0547) Pulse Rate:  [70-83] 76 (02/21 0547) Resp:  [17] 17 (02/21 0547) BP: (115-126)/(79-93) 126/93 (02/21 0547) SpO2:  [92 %-96 %] 95 % (02/21 0547) Last BM Date : 08/23/21 Intake/Output from previous day: 02/20 0701 - 02/21 0700 In: 591.7 [P.O.:240; IV Piggyback:351.7] Out: -  Intake/Output this shift: No intake/output data recorded.  PE: Gen: NAD Resp: +cough, nonlabored on room air Card: RRR Abd: soft, slight distension, drain with serosanguinous fluid MSK: no edema, calves soft and NT bilaterally  Lab Results:  Recent Labs    08/23/21 0540 08/24/21 0606  WBC 15.2* 14.9*  HGB 10.3* 11.0*  HCT 29.2* 32.6*  PLT 468* 178    BMET Recent Labs    08/23/21 0540 08/24/21 0606  NA 132* 133*  K 2.8* 3.6  CL 94* 96*  CO2 29 30  GLUCOSE 93 108*  BUN <5* <5*  CREATININE 0.37* 0.47  CALCIUM 7.9* 8.3*    PT/INR No results for input(s): LABPROT, INR in the last 72 hours. CMP     Component Value Date/Time   NA 133 (L) 08/24/2021 0606   K 3.6 08/24/2021 0606   CL 96 (L) 08/24/2021 0606   CO2 30 08/24/2021 0606   GLUCOSE 108 (H) 08/24/2021 0606   BUN <5 (L) 08/24/2021 0606   CREATININE 0.47 08/24/2021 0606   CALCIUM 8.3 (L) 08/24/2021 0606   PROT 6.9 08/16/2021 1554   ALBUMIN 2.6 (L) 08/18/2021 0622   AST 12 (L) 08/16/2021 1554   ALT 7 08/16/2021 1554   ALKPHOS 44 08/16/2021 1554   BILITOT 0.5 08/16/2021 1554   GFRNONAA >60 08/24/2021 0606   Lipase     Component Value Date/Time   LIPASE 28 08/16/2021 1554    Studies/Results: No results found.  Anti-infectives: Anti-infectives (From admission, onward)    Start     Dose/Rate Route Frequency Ordered Stop   08/21/21 1000  anidulafungin  (ERAXIS) 100 mg in sodium chloride 0.9 % 100 mL IVPB        100 mg 78 mL/hr over 100 Minutes Intravenous Every 24 hours 08/20/21 0724     08/20/21 0900  anidulafungin (ERAXIS) 200 mg in sodium chloride 0.9 % 200 mL IVPB        200 mg 78 mL/hr over 200 Minutes Intravenous  Once 08/20/21 0722 08/20/21 1347   08/17/21 1730  hydroxychloroquine (PLAQUENIL) tablet 200 mg  Status:  Discontinued        200 mg Oral Daily 08/17/21 1630 08/20/21 1225   08/16/21 1815  piperacillin-tazobactam (ZOSYN) IVPB 3.375 g        3.375 g 12.5 mL/hr over 240 Minutes Intravenous Every 8 hours 08/16/21 1805         Assessment/Plan Pelvic abscess Chronic peritonitis - Infectious vs inflammatory enteritis vs appendicitis, possible diverticulitis vs crohn's Leukocytosis  - CT showing: "1. Constellation of findings, most consistent with an inflammatory lower abdominal and pelvic process, including developing cul-de-sac abscess. Question if this represents infectious or inflammatory enteritis versus appendicitis. 2. Mild small bowel dilatation, suggesting secondary adynamic ileus." - WBC 14.9 (15.2), afebrile, continue IV abx - lactic acid normal 2/13 - IR transgluteal drain 2/15 - 15 ml purulent fluid aspirated with placement. Drain study 2/20 with  final read pending but does not look there is a fistula. Still with some output today so leave for now but likely out soon - continue IV abx, drain culture - e coli, with several sensitivities  - eraxis added 2/17 - cough - present since PTA. Lasix 2/19 - hypokalemia - resolved following PO repletion   - No urgent/emergent surgery recommended at this time and favor continued IV abx, drain, and repeat imaging. Monitor abdominal exam and labs and should she acutely worsen then operative intervention may become acutely necessary. Continue to plan for colonoscopy outpatient once inflammation resolved pending acute course -CT scan 2/18 negative for abscess   FEN: full  liquids ID: zosyn 2/13 >>, eraxis 2/17>> VTE: lovenox   Lupus - on plaquenil hypothyroidism     LOS: 7 days   I reviewed hospitalist notes, last 24 h vitals and pain scores, last 48 h intake and output, and last 24 h labs and trends.  This care required moderate level of medical decision making.   [facility] 08/24/2021, 8:32 AM Please see Amion for pager number during day hours 7:00am-4:30pm or 7:00am -11:30am on weekends

## 2021-08-24 NOTE — Assessment & Plan Note (Addendum)
Continue Synthroid °

## 2021-08-24 NOTE — Progress Notes (Signed)
PROGRESS NOTE Tannya Gonet  OMB:[id number] DOB: December 10, 1960 DOA: 08/16/2021 PCP: Patient, No Pcp Per (Inactive)   Brief Narrative/Hospital Course: 10yof w. Hx of lupus and hypothyroidism who presents with several days of abdominal pain and N/V/D , inflammatory process in lower abdomen on CT which could reflect enteritis or appendicitis per radiology, also have pelvic abscess status post drain, IR/general surgery following .WBC continue to trend up on broad-spectrum antibiotic and antifungal on board.  WBC now finally improving BMP stable.  Being treated for pelvic abscess, hyponatremia hypokalemia hypophosphatemia hypomagnesemia lupus and hypothyroidism.  Abscess culture from 2/15 showed abundant E. coli sensitive to cefepime ceftriaxone quinolone.    Subjective: Pain okay, BM okay no diarrhea. No nausea or vomiting Not much mobile. Asking sleeping aid  Assessment and Plan: * Pelvic abscess in female Pelvic abscess POA Chronic peritonitis-infectious versus inflammatory enteritis versus appendicitis, possible diverticulitis versus Crohn's: Refer to CT finding.  Followed by surgery, IR s/p transgluteal drain 2/15, 15 mL purulent fluid aspirated, drain history 2/20 completed, some output, on IV antibiotics with Zosyn since 2/13 and antifungal Eraxis 2/17. Drain culture with E. Coli.  Colonoscopy outpatient once inflammation resolves.  CT scan 2/18 negative for abscess.Appreciate surgery and IR input on board.  No urgent or emergent surgery planned.  Chronic peritonitis ([place]) See above  Electrolyte imbalance Monitor sodium potassium mag and phosphorus and replete as needed Recent Labs  Lab 08/17/21 1353 08/18/21 0622 08/19/21 0522 08/20/21 0529 08/21/21 0826 08/22/21 0547 08/23/21 0540 08/24/21 0606  K 3.0* 2.9* 3.5 2.9* 3.2* 3.4* 2.8* 3.6  CALCIUM 8.6* 8.0* 8.4* 8.4* 8.2* 8.4* 7.9* 8.3*  MG 1.9 2.0 1.9 1.8  --  1.9 1.7 2.1  PHOS 1.4* 1.1*   1.1* 2.1* 3.4  --   --  2.9  --      Systemic lupus erythematosus ([place])- (present on admission) Continue her hydroxychloroquine, [facility] rheumatology  Depression with anxiety- (present on admission) Mood stable, on home meds  Normocytic anemia- (present on admission) Stable.  Monitor Recent Labs  Lab 08/20/21 0529 08/21/21 0826 08/22/21 0547 08/23/21 0540 08/24/21 0606  HGB 10.9* 11.1* 10.6* 10.3* 11.0*  HCT 31.7* 32.0* 31.1* 29.2* 32.6*    Hypothyroidism- (present on admission) Continue Synthroid  DVT prophylaxis: enoxaparin (LOVENOX) injection 40 mg Start: 08/24/21 1200 Place and maintain sequential compression device Start: 08/19/21 0801 SCDs Start: 08/17/21 1328 Code Status:   Code Status: Full Code Family Communication: plan of care discussed with patient at bedside.  Disposition: Currently not medically stable for discharge. Status is: Inpatient Remains inpatient appropriate because: Ongoing management of patient's abscess drain Objective: Vitals last 24 hrs: Vitals:   08/22/21 2106 08/23/21 1506 08/23/21 2033 08/24/21 0547  BP: 129/88 117/79 115/82 (!) 126/93  Pulse:  83 70 76  Resp: 18  17 17   Temp: 98.8 F (37.1 C) 98.1 F (36.7 C) 98.8 F (37.1 C) 98.6 F (37 C)  TempSrc: Oral Oral Oral Oral  SpO2:  92% 96% 95%  Weight:      Height:       Weight change:   Physical Examination: General exam: AA, pleasant, older than stated age, weak appearing. HEENT:Oral mucosa moist, Ear/Nose WNL grossly, dentition normal. Respiratory system: bilaterally diminished BS, no use of accessory muscle Cardiovascular system: S1 & S2 +, No JVD,. Gastrointestinal system: Abdomen soft,NT,ND, BS+.  Drain present Nervous System:Alert, awake, moving extremities and grossly nonfocal Extremities: LE edema none,distal peripheral pulses palpable.  Skin: No rashes,no icterus. MSK: Normal muscle  bulk,tone, power  Medications reviewed:  Scheduled Meds:  acetaminophen  1,000 mg Oral Q6H    buPROPion  300 mg Oral Daily   enoxaparin (LOVENOX) injection  40 mg Subcutaneous Q24H   escitalopram  10 mg Oral Daily   feeding supplement  237 mL Oral BID BM   guaiFENesin  1,200 mg Oral BID   levothyroxine  100 mcg Oral Q0600   pantoprazole  40 mg Oral Q1200   protein supplement  2 oz Oral BID   sodium chloride flush  10 mL Intracatheter Q12H   Continuous Infusions:  sodium chloride     anidulafungin 100 mg (08/24/21 1100)   methocarbamol (ROBAXIN) IV 500 mg (08/24/21 0511)   piperacillin-tazobactam (ZOSYN)  IV 3.375 g (08/24/21 1122)   promethazine (PHENERGAN) injection (IM or IVPB)        Diet Order             Diet full liquid Room service appropriate? Yes; Fluid consistency: Thin  Diet effective now                            Intake/Output Summary (Last 24 hours) at 08/24/2021 1310 Last data filed at 08/23/2021 1700 Gross per 24 hour  Intake 351.68 ml  Output --  Net 351.68 ml   Net IO Since Admission: 6,642.99 mL [08/24/21 1310]  Wt Readings from Last 3 Encounters:  08/16/21 56.7 kg  02/15/21 56.7 kg     Unresulted Labs (From admission, onward)     Start     Ordered   08/23/21 0500  CBC  Every Mon-Wed-Fri (0500),   R      08/20/21 0807   08/23/21 7588  Basic metabolic panel  Daily,   R      08/22/21 1514          Data Reviewed: I have personally reviewed following labs and imaging studies CBC: Recent Labs  Lab 08/17/21 1353 08/18/21 0622 08/20/21 0529 08/21/21 0826 08/22/21 0547 08/23/21 0540 08/24/21 0606  WBC 11.1*   < > 14.6* 15.7* 17.0* 15.2* 14.9*  NEUTROABS 9.8*  --  11.8* 12.2*  --   --   --   HGB 11.2*   < > 10.9* 11.1* 10.6* 10.3* 11.0*  HCT 32.0*   < > 31.7* 32.0* 31.1* 29.2* 32.6*  MCV 95.2   < > 95.8 97.0 97.8 94.5 95.9  PLT 266   < > 375 427* 441* 468* 178   < > = values in this interval not displayed.   Basic Metabolic Panel: Recent Labs  Lab 08/17/21 1353 08/18/21 0622 08/19/21 0522 08/20/21 0529  08/21/21 0826 08/22/21 0547 08/23/21 0540 08/24/21 0606  NA 129* 128* 132* 132* 133* 135 132* 133*  K 3.0* 2.9* 3.5 2.9* 3.2* 3.4* 2.8* 3.6  CL 97* 98 99 97* 98 96* 94* 96*  CO2 20* 22 26 27 26 29 29 30   GLUCOSE 81 116* 109* 105* 85 83 93 108*  BUN 11 6 <5* <5* <5* <5* <5* <5*  CREATININE 0.48 0.55 0.45 0.45 0.50 0.50 0.37* 0.47  CALCIUM 8.6* 8.0* 8.4* 8.4* 8.2* 8.4* 7.9* 8.3*  MG 1.9 2.0 1.9 1.8  --  1.9 1.7 2.1  PHOS 1.4* 1.1*   1.1* 2.1* 3.4  --   --  2.9  --    GFR: Estimated Creatinine Clearance: 68.6 mL/min (by C-G formula based on SCr of 0.47 mg/dL). Liver Function Tests: Recent Labs  Lab 08/17/21 1353 08/18/21 0622  ALBUMIN 2.9* 2.6*   No results for input(s): LIPASE, AMYLASE in the last 168 hours. No results for input(s): AMMONIA in the last 168 hours. Coagulation Profile: Recent Labs  Lab 08/18/21 0622  INR 1.1   Cardiac Enzymes: No results for input(s): CKTOTAL, CKMB, CKMBINDEX, TROPONINI in the last 168 hours. BNP (last 3 results) No results for input(s): PROBNP in the last 8760 hours. HbA1C: No results for input(s): HGBA1C in the last 72 hours. CBG: Recent Labs  Lab 08/23/21 1150 08/23/21 1754 08/23/21 2356 08/24/21 0550 08/24/21 1204  GLUCAP 104* 139* 115* 102* 109*   Lipid Profile: No results for input(s): CHOL, HDL, LDLCALC, TRIG, CHOLHDL, LDLDIRECT in the last 72 hours. Thyroid Function Tests: No results for input(s): TSH, T4TOTAL, FREET4, T3FREE, THYROIDAB in the last 72 hours. Anemia Panel: No results for input(s): VITAMINB12, FOLATE, FERRITIN, TIBC, IRON, RETICCTPCT in the last 72 hours. Sepsis Labs: Recent Labs  Lab 08/22/21 0547  LATICACIDVEN 1.3    Recent Results (from the past 240 hour(s))  Resp Panel by RT-PCR (Flu A&B, Covid) Nasopharyngeal Swab     Status: None   Collection Time: 08/16/21  4:00 PM   Specimen: Nasopharyngeal Swab; Nasopharyngeal(NP) swabs in vial transport medium  Result Value Ref Range Status   SARS  Coronavirus 2 by RT PCR NEGATIVE NEGATIVE Final    Comment: (NOTE) SARS-CoV-2 target nucleic acids are NOT DETECTED.  The SARS-CoV-2 RNA is generally detectable in upper respiratory specimens during the acute phase of infection. The lowest concentration of SARS-CoV-2 viral copies this assay can detect is 138 copies/mL. A negative result does not preclude SARS-Cov-2 infection and should not be used as the sole basis for treatment or other patient management decisions. A negative result may occur with  improper specimen collection/handling, submission of specimen other than nasopharyngeal swab, presence of viral mutation(s) within the areas targeted by this assay, and inadequate number of viral copies(<138 copies/mL). A negative result must be combined with clinical observations, patient history, and epidemiological information. The expected result is Negative.  Fact Sheet for Patients:  EntrepreneurPulse.com.au  Fact Sheet for Healthcare Providers:  IncredibleEmployment.be  This test is no t yet approved or cleared by the [place] FDA and  has been authorized for detection and/or diagnosis of SARS-CoV-2 by FDA under an Emergency Use Authorization (EUA). This EUA will remain  in effect (meaning this test can be used) for the duration of the COVID-19 declaration under Section 564(b)(1) of the Act, 21 U.S.C.section 360bbb-3(b)(1), unless the authorization is terminated  or revoked sooner.       Influenza A by PCR NEGATIVE NEGATIVE Final   Influenza B by PCR NEGATIVE NEGATIVE Final    Comment: (NOTE) The Xpert Xpress SARS-CoV-2/FLU/RSV plus assay is intended as an aid in the diagnosis of influenza from Nasopharyngeal swab specimens and should not be used as a sole basis for treatment. Nasal washings and aspirates are unacceptable for Xpert Xpress SARS-CoV-2/FLU/RSV testing.  Fact Sheet for  Patients: EntrepreneurPulse.com.au  Fact Sheet for Healthcare Providers: IncredibleEmployment.be  This test is not yet approved or cleared by the [place] FDA and has been authorized for detection and/or diagnosis of SARS-CoV-2 by FDA under an Emergency Use Authorization (EUA). This EUA will remain in effect (meaning this test can be used) for the duration of the COVID-19 declaration under Section 564(b)(1) of the Act, 21 U.S.C. section 360bbb-3(b)(1), unless the authorization is terminated or revoked.  Performed at [facility],  [address], [place], [place] [address]   Blood culture (routine x 2)     Status: None   Collection Time: 08/16/21  6:22 PM   Specimen: BLOOD  Result Value Ref Range Status   Specimen Description   Final    BLOOD BLOOD LEFT FOREARM Performed at [facility], [address] [place], [place] [address]    Special Requests   Final    BOTTLES DRAWN AEROBIC AND ANAEROBIC Blood Culture adequate volume Performed at [facility], [address] [place], [place] [address]    Culture   Final    NO GROWTH 5 DAYS Performed at [facility], [place] [address], [place], [place] [address]    Report Status 08/21/2021 FINAL  Final  Blood culture (routine x 2)     Status: None   Collection Time: 08/16/21  6:25 PM   Specimen: BLOOD LEFT ARM  Result Value Ref Range Status   Specimen Description   Final    BLOOD LEFT ARM Performed at [facility], [address] [place], [place] [address]    Special Requests   Final    BOTTLES DRAWN AEROBIC AND ANAEROBIC Blood Culture adequate volume Performed at [facility], [address], [place], [place] [address]    Culture   Final    NO GROWTH 5 DAYS Performed at [facility], [place] [address] [place], [place] [address]    Report Status 08/21/2021 FINAL  Final   Aerobic/Anaerobic Culture w Gram Stain (surgical/deep wound)     Status: None (Preliminary result)   Collection Time: 08/18/21  4:21 PM   Specimen: Abscess  Result Value Ref Range Status   Specimen Description   Final    ABSCESS Performed at [place] [address] [place], [place] [address]    Special Requests   Final    PELVIC Performed at [facility], [place] [address], [place], [place] [address]    Gram Stain   Final    ABUNDANT WBC PRESENT, PREDOMINANTLY PMN ABUNDANT GRAM NEGATIVE RODS FEW GRAM POSITIVE COCCI IN PAIRS IN CLUSTERS FEW GRAM POSITIVE RODS    Culture   Final    ABUNDANT ESCHERICHIA COLI HOLDING FOR POSSIBLE ANAEROBE Performed at [facility], [place] [address], [place], [place] [address]    Report Status PENDING  Incomplete   Organism ID, Bacteria ESCHERICHIA COLI  Final      Susceptibility   Escherichia coli - MIC*    AMPICILLIN >=32 RESISTANT Resistant     CEFAZOLIN <=4 SENSITIVE Sensitive     CEFEPIME <=0.12 SENSITIVE Sensitive     CEFTAZIDIME <=1 SENSITIVE Sensitive     CEFTRIAXONE <=0.25 SENSITIVE Sensitive     CIPROFLOXACIN <=0.25 SENSITIVE Sensitive     GENTAMICIN <=1 SENSITIVE Sensitive     IMIPENEM <=0.25 SENSITIVE Sensitive     TRIMETH/SULFA >=320 RESISTANT Resistant     AMPICILLIN/SULBACTAM 8 SENSITIVE Sensitive     PIP/TAZO <=4 SENSITIVE Sensitive     * ABUNDANT ESCHERICHIA COLI    Antimicrobials: Anti-infectives (From admission, onward)    Start     Dose/Rate Route Frequency Ordered Stop   08/21/21 1000  anidulafungin (ERAXIS) 100 mg in sodium chloride 0.9 % 100 mL IVPB        100 mg 78 mL/hr over 100 Minutes Intravenous Every 24 hours 08/20/21 0724     08/20/21 0900  anidulafungin (ERAXIS) 200 mg in sodium chloride 0.9 %  200 mL IVPB        200 mg 78 mL/hr over 200 Minutes Intravenous  Once 08/20/21 0722 08/20/21 1347   08/17/21 1730  hydroxychloroquine (PLAQUENIL) tablet 200 mg  Status:   Discontinued        200 mg Oral Daily 08/17/21 1630 08/20/21 1225   08/16/21 1815  piperacillin-tazobactam (ZOSYN) IVPB 3.375 g        3.375 g 12.5 mL/hr over 240 Minutes Intravenous Every 8 hours 08/16/21 1805        Culture/Microbiology    Component Value Date/Time   SDES  08/18/2021 1621    ABSCESS Performed at [facility], [place] [address], [place], [place] [address]    SPECREQUEST  08/18/2021 1621    PELVIC Performed at [facility], [place] [address] [place], [place] [address]    CULT  08/18/2021 1621    ABUNDANT ESCHERICHIA COLI HOLDING FOR POSSIBLE ANAEROBE Performed at [facility], [place] [address], [place], [place] [address]    REPTSTATUS PENDING 08/18/2021 1621    Other culture-see note  Radiology Studies: No results found.   LOS: 7 days   Antonieta Pert, MD [facility]  08/24/2021, 1:10 PM

## 2021-08-24 NOTE — Assessment & Plan Note (Addendum)
Repleted.  Monitor Recent Labs  Lab 08/22/21 0547 08/23/21 0540 08/24/21 0606 08/25/21 0606 08/27/21 0601  K 3.4* 2.8* 3.6 3.4* 4.2  CALCIUM 8.4* 7.9* 8.3* 8.1* 8.4*  MG 1.9 1.7 2.1 1.8 2.1  PHOS  --  2.9  --   --   --

## 2021-08-24 NOTE — Assessment & Plan Note (Signed)
Mood stable, on home meds

## 2021-08-24 NOTE — Assessment & Plan Note (Addendum)
Holding in 9 to 10 g range.  Monitor Recent Labs  Lab 08/23/21 0540 08/24/21 0606 08/25/21 0606 08/26/21 0607 08/27/21 0601  HGB 10.3* 11.0* 9.9* 9.9* 10.0*  HCT 29.2* 32.6* 29.3* 29.2* 30.3*

## 2021-08-24 NOTE — Hospital Course (Addendum)
63yof w. Hx of lupus and hypothyroidism who presents with several days of abdominal pain and N/V/D , inflammatory process in lower abdomen on CT which could reflect enteritis or appendicitis per radiology, also have pelvic abscess status post drain, IR/general surgery following .WBC continue to trend up on broad-spectrum antibiotic and antifungal on board.  WBC now finally improving BMP stable.  Being treated for pelvic abscess, hyponatremia hypokalemia hypophosphatemia hypomagnesemia lupus and hypothyroidism.  Abscess culture from 2/15 showed abundant E. coli sensitive to cefepime ceftriaxone quinolone. surgery,IR on board-s/p transgluteal drain 2/15, w/ 15 mL purulent fluid aspirated, drain study  2/20 completed Drain culture with E. Coli, on IV antibiotics with Zosyn since 2/13 and antifungal Eraxis 2/17. CT scan 2/18 negative for abscess.Patient's drain is out.Will need colonoscopy outpatient once inflammation resolves.At this time patient is clinically improved leukocytosis almost resolved afebrile tolerating diet. Patient was having joint pain aches so monitored overnight symptom improved with ibuprofen. But with low grade temp, not much appetite and no BM. Repeat CT ordered by Dr Marlou Starks 08/28/21-increase in size of multiple fluid collection in the posterior pelvis and right lower quadrant, reviewed by IR Dr. Murvin Natal: And surgery unable to be drained at this time, no intestinal obstruction and no other significant finding monitored overnight leukocytosis resolved no recurrence of fever patient feels well having some bowel movement loose-slowly increasing her oral intake.  Seen by Dr. Marlou Starks this morning okay for discharge home with follow-up in few days for repeat CT abdomen for his patient will call Dr. Johney Maine office.  She would like to have home health and will have to see evidence.

## 2021-08-24 NOTE — Assessment & Plan Note (Addendum)
Patient having generalized pain and discomfort feels reluctant to go home.IMPROVED WITH ibuprofen- likely from patient's SLE was not getting Plaquenil and postop course.  Continue her hydroxychloroquine, followed by [facility] rheumatology.

## 2021-08-24 NOTE — Assessment & Plan Note (Addendum)
Pelvic abscess POA Chronic peritonitis-infectious versus inflammatory enteritis versus appendicitis, possible diverticulitis versus Crohn's: surgery,IR on board-s/p transgluteal drain 2/15, w/ 15 mL purulent fluid aspirated, drain study  2/20 completed Drain culture with E. Coli, on IV antibiotics with Zosyn since 2/13 and antifungal Eraxis 2/17. CT scan 2/18 negative for abscess. Patient's drain is out.Will need colonoscopy outpatient once inflammation resolves. having joint pain aches so monitored overnight symptom improved with ibuprofen. But with low grade temp, not much appetite and no BM. Repeat CT ordered by Dr Marlou Starks 08/28/21- reviewed by Dr Fredric Dine surgical intervention recommended IR eval- spoke w/ IR MD and not amenable for drainage for now- see IR note. On iv antibiotics for now.

## 2021-08-24 NOTE — Assessment & Plan Note (Signed)
See above

## 2021-08-24 NOTE — Progress Notes (Signed)
Referring Physician(s): Gross,S  Supervising Physician: Michaelle Birks  Patient Status:  [facility] - In-pt  Chief Complaint: Pelvic abscess   Subjective: Pt without new c/o   Allergies: Patient has no known allergies.  Medications: Prior to Admission medications   Medication Sig Start Date End Date Taking? Authorizing Provider  buPROPion (WELLBUTRIN XL) 300 MG 24 hr tablet Take 300 mg by mouth daily. 07/29/21  Yes [provider]  escitalopram (LEXAPRO) 10 MG tablet Take 10 mg by mouth daily. 07/17/21  Yes [provider]  hydroxychloroquine (PLAQUENIL) 200 MG tablet Take 200 mg by mouth daily.   Yes [provider]  ibuprofen (ADVIL) 200 MG tablet Take 200 mg by mouth every 6 (six) hours as needed.   Yes [provider]  levothyroxine (SYNTHROID, LEVOTHROID) 100 MCG tablet Take 100 mcg by mouth daily before breakfast.   Yes [provider]  ondansetron (ZOFRAN ODT) 4 MG disintegrating tablet Take 1 tablet (4 mg total) by mouth every 8 (eight) hours as needed for nausea or vomiting. Patient not taking: Reported on 08/17/2021 02/15/21   Claud Kelp, MD     Vital Signs: BP (!) 126/93 (BP Location: Right Arm)    Pulse 76    Temp 98.6 F (37 C) (Oral)    Resp 17    Ht 5\' 5"  (1.651 m)    Wt 125 lb (56.7 kg)    SpO2 95%    BMI 20.80 kg/m   Physical Exam : awake/alert; TG drain intact, OP appears to be about 20 cc blood-tinged fluid; to JP bulb  Imaging: CT ABDOMEN PELVIS W CONTRAST  Result Date: 08/21/2021 CLINICAL DATA:  Abdominal pain EXAM: CT ABDOMEN AND PELVIS WITH CONTRAST TECHNIQUE: Multidetector CT imaging of the abdomen and pelvis was performed using the standard protocol following bolus administration of intravenous contrast. RADIATION DOSE REDUCTION: This exam was performed according to the departmental dose-optimization program which includes automated exposure control, adjustment of the mA and/or kV according to patient size  and/or use of iterative reconstruction technique. CONTRAST:  146mL OMNIPAQUE IOHEXOL 300 MG/ML  SOLN COMPARISON:  08/16/2021 FINDINGS: Lower chest: Coronary artery calcifications are seen. There is interval appearance of small bilateral pleural effusions. There are new infiltrates in the posterior lower lung fields with air bronchograms on both sides suggesting atelectasis/pneumonia. There are new patchy ground-glass infiltrates in the right middle lobe right upper lobe and to a lesser extent in the anterior left upper lobe. Hepatobiliary: Liver measures 18.9 cm in length. No focal abnormality is seen. Gallbladder is distended. There is no wall thickening or pericholecystic fluid collection. There is slight prominence of bile ducts. Pancreas: No focal abnormality is seen. Spleen: Unremarkable. Adrenals/Urinary Tract: Adrenals are unremarkable. There is no hydronephrosis. There are no renal or ureteral stones. Stomach/Bowel: Small hiatal hernia is seen. Stomach is unremarkable. There is dilation of proximal small-bowel loops measuring up to 4.3 cm. There is no significant dilation of distal small bowel loops. Appendix is not distinctly visualized. There is fluid in the lumen of right colon. There is no significant wall thickening in colon. There is interval placement of pigtail drainage catheter in the posterior pelvis with almost complete resolution of large loculated fluid collection in the cul-de-sac. Small residual pocket of fluid is seen in the right side of pelvis measuring proximally 2.4 x 0.8 cm. Vascular/Lymphatic: Scattered arterial calcifications are seen. Reproductive: Unremarkable. Other: There is no ascites or pneumoperitoneum. There is edema in the subcutaneous plane. Small umbilical hernia  containing fat is seen. Musculoskeletal: Unremarkable. IMPRESSION: There is dilation of proximal small bowel loops suggesting possible ileus. Less likely possibility would be partial small bowel obstruction. There  is no ascites or pneumoperitoneum. There is no hydronephrosis. There is almost complete resolution of loculated fluid collection in cul-de-sac in pelvis after placement of drainage catheter. There is small residual linear 2.4 x 0.8 cm loculated fluid collection in the right pelvic cavity. Enlarged liver. Gallbladder is distended, possibly due to fasting state. If there is clinical suspicion for acute cholecystitis, gallbladder sonogram may be considered. Small hiatal hernia. Small bilateral pleural effusions. There are dense infiltrates in both lower lobes suggesting atelectasis/pneumonia. There are new faint ground-glass infiltrates in the right middle lobe, right upper lobe and left upper lobe suggesting multifocal pneumonia. Other findings as described in the body of the report. Electronically Signed   By: Elmer Picker M.D.   On: 08/21/2021 16:01    Labs:  CBC: Recent Labs    08/21/21 0826 08/22/21 0547 08/23/21 0540 08/24/21 0606  WBC 15.7* 17.0* 15.2* 14.9*  HGB 11.1* 10.6* 10.3* 11.0*  HCT 32.0* 31.1* 29.2* 32.6*  PLT 427* 441* 468* 178    COAGS: Recent Labs    08/18/21 0622  INR 1.1    BMP: Recent Labs    08/21/21 0826 08/22/21 0547 08/23/21 0540 08/24/21 0606  NA 133* 135 132* 133*  K 3.2* 3.4* 2.8* 3.6  CL 98 96* 94* 96*  CO2 26 29 29 30   GLUCOSE 85 83 93 108*  BUN <5* <5* <5* <5*  CALCIUM 8.2* 8.4* 7.9* 8.3*  CREATININE 0.50 0.50 0.37* 0.47  GFRNONAA >60 >60 >60 >60    LIVER FUNCTION TESTS: Recent Labs    08/16/21 1554 08/17/21 1353 08/18/21 0622  BILITOT 0.5  --   --   AST 12*  --   --   ALT 7  --   --   ALKPHOS 44  --   --   PROT 6.9  --   --   ALBUMIN 3.7 2.9* 2.6*    Assessment and Plan: 61 y.o. female ex-smoker with past medical history significant for lupus, arthritis, anxiety disorder, and hypothyroidism ; now with pelvic pain, leukocytosis, pelvic abscess, s/p 12 fr drain placement via TG approach on 08/18/21; drain to JP bulb;  afebrile;WBC 14.9(15.2), hgb stable; creat nl; drain injection yesterday revealed no definite fistula to bowel; above d/w CCS- plan to leave drain in for now and possibly remove prior to discharge   Electronically Signed: D. Rowe Robert, PA-C 08/24/2021, 12:51 PM   I spent a total of 15 Minutes at the the patient's bedside AND on the patient's hospital floor or unit, greater than 50% of which was counseling/coordinating care for pelvic abscess drain    Patient ID: Deanna Fischer, female   DOB: 14-Sep-1960, 61 y.o.   MRN: [id number]

## 2021-08-25 LAB — CBC
HCT: 29.3 % — ABNORMAL LOW (ref 36.0–46.0)
Hemoglobin: 9.9 g/dL — ABNORMAL LOW (ref 12.0–15.0)
MCH: 33.1 pg (ref 26.0–34.0)
MCHC: 33.8 g/dL (ref 30.0–36.0)
MCV: 98 fL (ref 80.0–100.0)
Platelets: 486 10*3/uL — ABNORMAL HIGH (ref 150–400)
RBC: 2.99 MIL/uL — ABNORMAL LOW (ref 3.87–5.11)
RDW: 13.3 % (ref 11.5–15.5)
WBC: 15.4 10*3/uL — ABNORMAL HIGH (ref 4.0–10.5)
nRBC: 0 % (ref 0.0–0.2)

## 2021-08-25 LAB — GLUCOSE, CAPILLARY
Glucose-Capillary: 98 mg/dL (ref 70–99)
Glucose-Capillary: 107 mg/dL — ABNORMAL HIGH (ref 70–99)
Glucose-Capillary: 108 mg/dL — ABNORMAL HIGH (ref 70–99)
Glucose-Capillary: 98 mg/dL (ref 70–99)

## 2021-08-25 LAB — BASIC METABOLIC PANEL
Anion gap: 5 (ref 5–15)
BUN: 6 mg/dL (ref 6–20)
CO2: 29 mmol/L (ref 22–32)
Calcium: 8.1 mg/dL — ABNORMAL LOW (ref 8.9–10.3)
Chloride: 97 mmol/L — ABNORMAL LOW (ref 98–111)
Creatinine, Ser: 0.37 mg/dL — ABNORMAL LOW (ref 0.44–1.00)
GFR, Estimated: >60 mL/min (ref >60)
Glucose, Bld: 103 mg/dL — ABNORMAL HIGH (ref 70–99)
Potassium: 3.4 mmol/L — ABNORMAL LOW (ref 3.5–5.1)
Sodium: 131 mmol/L — ABNORMAL LOW (ref 135–145)

## 2021-08-25 LAB — MAGNESIUM: Magnesium: 1.8 mg/dL (ref 1.7–2.4)

## 2021-08-25 LAB — AEROBIC/ANAEROBIC CULTURE W GRAM STAIN (SURGICAL/DEEP WOUND)

## 2021-08-25 MED ORDER — SENNOSIDES-DOCUSATE SODIUM 8.6-50 MG PO TABS
1.0000 | ORAL_TABLET | Freq: Every day | ORAL | Status: DC
Start: 2021-08-25 — End: 2021-08-25

## 2021-08-25 MED ORDER — SENNOSIDES-DOCUSATE SODIUM 8.6-50 MG PO TABS
1.0000 | ORAL_TABLET | Freq: Every day | ORAL | Status: DC
  Administered 2021-08-26 – 2021-08-28 (×3): 1 via ORAL
  Filled 2021-08-25 (×5): qty 1

## 2021-08-25 MED ORDER — MAGNESIUM SULFATE 2 GM/50ML IV SOLN
2.0000 g | Freq: Once | INTRAVENOUS | Status: AC
  Administered 2021-08-25: 2 g via INTRAVENOUS
  Filled 2021-08-25: qty 50

## 2021-08-25 MED ORDER — POLYETHYLENE GLYCOL 3350 17 G PO PACK
17.0000 g | PACK | Freq: Every day | ORAL | Status: DC
  Administered 2021-08-25 – 2021-08-29 (×5): 17 g via ORAL
  Filled 2021-08-25 (×7): qty 1

## 2021-08-25 MED ORDER — BISACODYL 10 MG RE SUPP: 10.0000 mg | Freq: Every day | RECTAL | Status: DC | PRN

## 2021-08-25 MED ORDER — POTASSIUM CHLORIDE 20 MEQ PO PACK
60.0000 meq | PACK | Freq: Once | ORAL | Status: AC
  Administered 2021-08-25: 60 meq via ORAL
  Filled 2021-08-25: qty 3

## 2021-08-25 MED ORDER — DOCUSATE SODIUM 100 MG PO CAPS
100.0000 mg | ORAL_CAPSULE | Freq: Every day | ORAL | Status: DC
Start: 2021-08-26 — End: 2021-08-29
  Administered 2021-08-26 – 2021-08-29 (×4): 100 mg via ORAL
  Filled 2021-08-25 (×4): qty 1

## 2021-08-25 MED ORDER — POLYETHYLENE GLYCOL 3350 17 G PO PACK: 17.0000 g | PACK | Freq: Every day | ORAL | Status: DC | PRN

## 2021-08-25 NOTE — Progress Notes (Signed)
PROGRESS NOTE Deanna Fischer  TLX:[id number] DOB: Sep 15, 1960 DOA: 08/16/2021 PCP: Patient, No Pcp Per (Inactive)   Brief Narrative/Hospital Course: 65yof w. Hx of lupus and hypothyroidism who presents with several days of abdominal pain and N/V/D , inflammatory process in lower abdomen on CT which could reflect enteritis or appendicitis per radiology, also have pelvic abscess status post drain, IR/general surgery following .WBC continue to trend up on broad-spectrum antibiotic and antifungal on board.  WBC now finally improving BMP stable.  Being treated for pelvic abscess, hyponatremia hypokalemia hypophosphatemia hypomagnesemia lupus and hypothyroidism.  Abscess culture from 2/15 showed abundant E. coli sensitive to cefepime ceftriaxone quinolone.     Subjective: Seen and examined this morning tolerating liquid diet but still complains of abdominal pain cough is much better, passing flatus Overnight no fever blood pressure stable  Wbc up 14.9> 15.4, stable hh and lytes okay On full liquid diet Drain output 13 mL  Assessment and Plan: * Pelvic abscess in female Pelvic abscess POA Chronic peritonitis-infectious versus inflammatory enteritis versus appendicitis, possible diverticulitis versus Crohn's: surgery, IR on board-s/p transgluteal drain 2/15, w/ 15 mL purulent fluid aspirated, drain study  2/20 completed, some output, on IV antibiotics with Zosyn since 2/13 and antifungal Eraxis 2/17. Drain culture with E. Coli. Will need colonoscopy outpatient once inflammation resolves. -CT scan 2/18 negative for abscess.drain remains in place-with output, noted worsening of WBC count but clinically stable improving, continue full liquid diet ADAT soft/as per surgery No urgent or emergent surgery planned.  Pain medicine/stool softener being adjusted by surgery  Chronic peritonitis ([place]) See above  Electrolyte imbalance Electrolytes improving replete kcl.Monitor Recent Labs  Lab 08/19/21 0522  08/20/21 0529 08/21/21 0826 08/22/21 0547 08/23/21 0540 08/24/21 0606 08/25/21 0606  K 3.5 2.9* 3.2* 3.4* 2.8* 3.6 3.4*  CALCIUM 8.4* 8.4* 8.2* 8.4* 7.9* 8.3* 8.1*  MG 1.9 1.8  --  1.9 1.7 2.1 1.8  PHOS 2.1* 3.4  --   --  2.9  --   --     Systemic lupus erythematosus ([place])- (present on admission) Continue her hydroxychloroquine, followed by [facility] rheumatology  Depression with anxiety- (present on admission) Mood stable, on home meds  Normocytic anemia- (present on admission) Holding in 9 to 10 g range.  Monitor Recent Labs  Lab 08/21/21 0826 08/22/21 0547 08/23/21 0540 08/24/21 0606 08/25/21 0606  HGB 11.1* 10.6* 10.3* 11.0* 9.9*  HCT 32.0* 31.1* 29.2* 32.6* 29.3*    Hypothyroidism- (present on admission) Continue Synthroid  DVT prophylaxis: enoxaparin (LOVENOX) injection 40 mg Start: 08/24/21 1200 Place and maintain sequential compression device Start: 08/19/21 0801 SCDs Start: 08/17/21 1328 Code Status:   Code Status: Full Code Family Communication: plan of care discussed with patient at bedside.  Disposition: Currently not medically stable for discharge. Status is: Inpatient Remains inpatient appropriate because: Ongoing management of patient's abscess drain Objective: Vitals last 24 hrs: Vitals:   08/24/21 0547 08/24/21 1424 08/24/21 2102 08/25/21 0533  BP: (!) 126/93 (!) 125/98 119/87 116/86  Pulse: 76 78 77 73  Resp: 17 16 17 18   Temp: 98.6 F (37 C) 98.2 F (36.8 C) 98.9 F (37.2 C) 98.2 F (36.8 C)  TempSrc: Oral Oral Oral Oral  SpO2: 95% 94% 92% 93%  Weight:      Height:       Weight change:   Physical Examination: General exam: AA0x3, older than stated age, weak appearing. HEENT:Oral mucosa moist, Ear/Nose WNL grossly, dentition normal. Respiratory system: bilaterally diminished, no use of accessory muscle  Cardiovascular system: S1 & S2 +, No JVD,. Gastrointestinal system: Abdomen soft,NT,ND,BS+, drain in place Nervous System:Alert,  awake, moving extremities and grossly nonfocal Extremities: LE ankle edema none, distal peripheral pulses palpable.  Skin: No rashes,no icterus. MSK: Normal muscle bulk,tone, power   Medications reviewed:  Scheduled Meds:  acetaminophen  1,000 mg Oral Q6H   buPROPion  300 mg Oral Daily   [START ON 08/26/2021] docusate sodium  100 mg Oral Daily   enoxaparin (LOVENOX) injection  40 mg Subcutaneous Q24H   escitalopram  10 mg Oral Daily   feeding supplement  237 mL Oral BID BM   guaiFENesin  1,200 mg Oral BID   levothyroxine  100 mcg Oral Q0600   pantoprazole  40 mg Oral Q1200   protein supplement  2 oz Oral BID   senna-docusate  1 tablet Oral QHS   sodium chloride flush  10 mL Intracatheter Q12H   Continuous Infusions:  sodium chloride     anidulafungin 100 mg (08/25/21 1014)   methocarbamol (ROBAXIN) IV 500 mg (08/25/21 0533)   piperacillin-tazobactam (ZOSYN)  IV 3.375 g (08/25/21 1113)   promethazine (PHENERGAN) injection (IM or IVPB)        Diet Order             Diet full liquid Room service appropriate? Yes; Fluid consistency: Thin  Diet effective now                            Intake/Output Summary (Last 24 hours) at 08/25/2021 1143 Last data filed at 08/25/2021 0945 Gross per 24 hour  Intake 710 ml  Output 30 ml  Net 680 ml   Net IO Since Admission: 7,322.99 mL [08/25/21 1143]  Wt Readings from Last 3 Encounters:  08/16/21 56.7 kg  02/15/21 56.7 kg     Unresulted Labs (From admission, onward)     Start     Ordered   08/26/21 0500  CBC  Once,   R        08/24/21 1618   08/23/21 0500  CBC  Every Mon-Wed-Fri (0500),   R      08/20/21 0807          Data Reviewed: I have personally reviewed following labs and imaging studies CBC: Recent Labs  Lab 08/20/21 0529 08/21/21 0826 08/22/21 0547 08/23/21 0540 08/24/21 0606 08/25/21 0606  WBC 14.6* 15.7* 17.0* 15.2* 14.9* 15.4*  NEUTROABS 11.8* 12.2*  --   --   --   --   HGB 10.9* 11.1* 10.6*  10.3* 11.0* 9.9*  HCT 31.7* 32.0* 31.1* 29.2* 32.6* 29.3*  MCV 95.8 97.0 97.8 94.5 95.9 98.0  PLT 375 427* 441* 468* 178 326*   Basic Metabolic Panel: Recent Labs  Lab 08/19/21 0522 08/20/21 0529 08/21/21 0826 08/22/21 0547 08/23/21 0540 08/24/21 0606 08/25/21 0606  NA 132* 132* 133* 135 132* 133* 131*  K 3.5 2.9* 3.2* 3.4* 2.8* 3.6 3.4*  CL 99 97* 98 96* 94* 96* 97*  CO2 26 27 26 29 29 30 29   GLUCOSE 109* 105* 85 83 93 108* 103*  BUN <5* <5* <5* <5* <5* <5* 6  CREATININE 0.45 0.45 0.50 0.50 0.37* 0.47 0.37*  CALCIUM 8.4* 8.4* 8.2* 8.4* 7.9* 8.3* 8.1*  MG 1.9 1.8  --  1.9 1.7 2.1 1.8  PHOS 2.1* 3.4  --   --  2.9  --   --    GFR: Estimated Creatinine Clearance: 68.6 mL/min (  A) (by C-G formula based on SCr of 0.37 mg/dL (L)). Liver Function Tests: No results for input(s): AST, ALT, ALKPHOS, BILITOT, PROT, ALBUMIN in the last 168 hours.  No results for input(s): LIPASE, AMYLASE in the last 168 hours. No results for input(s): AMMONIA in the last 168 hours. Coagulation Profile: No results for input(s): INR, PROTIME in the last 168 hours.  Cardiac Enzymes: No results for input(s): CKTOTAL, CKMB, CKMBINDEX, TROPONINI in the last 168 hours. BNP (last 3 results) No results for input(s): PROBNP in the last 8760 hours. HbA1C: No results for input(s): HGBA1C in the last 72 hours. CBG: Recent Labs  Lab 08/24/21 0550 08/24/21 1204 08/24/21 1813 08/24/21 2338 08/25/21 0529  GLUCAP 102* 109* 107* 106* 98   Lipid Profile: No results for input(s): CHOL, HDL, LDLCALC, TRIG, CHOLHDL, LDLDIRECT in the last 72 hours. Thyroid Function Tests: No results for input(s): TSH, T4TOTAL, FREET4, T3FREE, THYROIDAB in the last 72 hours. Anemia Panel: No results for input(s): VITAMINB12, FOLATE, FERRITIN, TIBC, IRON, RETICCTPCT in the last 72 hours. Sepsis Labs: Recent Labs  Lab 08/22/21 0547  LATICACIDVEN 1.3    Recent Results (from the past 240 hour(s))  Resp Panel by RT-PCR (Flu  A&B, Covid) Nasopharyngeal Swab     Status: None   Collection Time: 08/16/21  4:00 PM   Specimen: Nasopharyngeal Swab; Nasopharyngeal(NP) swabs in vial transport medium  Result Value Ref Range Status   SARS Coronavirus 2 by RT PCR NEGATIVE NEGATIVE Final    Comment: (NOTE) SARS-CoV-2 target nucleic acids are NOT DETECTED.  The SARS-CoV-2 RNA is generally detectable in upper respiratory specimens during the acute phase of infection. The lowest concentration of SARS-CoV-2 viral copies this assay can detect is 138 copies/mL. A negative result does not preclude SARS-Cov-2 infection and should not be used as the sole basis for treatment or other patient management decisions. A negative result may occur with  improper specimen collection/handling, submission of specimen other than nasopharyngeal swab, presence of viral mutation(s) within the areas targeted by this assay, and inadequate number of viral copies(<138 copies/mL). A negative result must be combined with clinical observations, patient history, and epidemiological information. The expected result is Negative.  Fact Sheet for Patients:  EntrepreneurPulse.com.au  Fact Sheet for Healthcare Providers:  IncredibleEmployment.be  This test is no t yet approved or cleared by the [place] FDA and  has been authorized for detection and/or diagnosis of SARS-CoV-2 by FDA under an Emergency Use Authorization (EUA). This EUA will remain  in effect (meaning this test can be used) for the duration of the COVID-19 declaration under Section 564(b)(1) of the Act, 21 U.S.C.section 360bbb-3(b)(1), unless the authorization is terminated  or revoked sooner.       Influenza A by PCR NEGATIVE NEGATIVE Final   Influenza B by PCR NEGATIVE NEGATIVE Final    Comment: (NOTE) The Xpert Xpress SARS-CoV-2/FLU/RSV plus assay is intended as an aid in the diagnosis of influenza from Nasopharyngeal swab specimens  and should not be used as a sole basis for treatment. Nasal washings and aspirates are unacceptable for Xpert Xpress SARS-CoV-2/FLU/RSV testing.  Fact Sheet for Patients: EntrepreneurPulse.com.au  Fact Sheet for Healthcare Providers: IncredibleEmployment.be  This test is not yet approved or cleared by the [place] FDA and has been authorized for detection and/or diagnosis of SARS-CoV-2 by FDA under an Emergency Use Authorization (EUA). This EUA will remain in effect (meaning this test can be used) for the duration of the COVID-19 declaration under Section 564(b)(1) of  the Act, 21 U.S.C. section 360bbb-3(b)(1), unless the authorization is terminated or revoked.  Performed at [facility], [address], [place], [place] [address]   Blood culture (routine x 2)     Status: None   Collection Time: 08/16/21  6:22 PM   Specimen: BLOOD  Result Value Ref Range Status   Specimen Description   Final    BLOOD BLOOD LEFT FOREARM Performed at [facility], [address] [place], [place] [address]    Special Requests   Final    BOTTLES DRAWN AEROBIC AND ANAEROBIC Blood Culture adequate volume Performed at [facility], [address] [place], [place] [address]    Culture   Final    NO GROWTH 5 DAYS Performed at [facility], [place] [address], [place], [place] [address]    Report Status 08/21/2021 FINAL  Final  Blood culture (routine x 2)     Status: None   Collection Time: 08/16/21  6:25 PM   Specimen: BLOOD LEFT ARM  Result Value Ref Range Status   Specimen Description   Final    BLOOD LEFT ARM Performed at [facility], [address], [place], [place] [address]    Special Requests   Final    BOTTLES DRAWN AEROBIC AND ANAEROBIC Blood Culture adequate volume Performed at [facility], [address], [place], [place] [address]     Culture   Final    NO GROWTH 5 DAYS Performed at [facility], [place] [address], [place], [place] [address]    Report Status 08/21/2021 FINAL  Final  Aerobic/Anaerobic Culture w Gram Stain (surgical/deep wound)     Status: None   Collection Time: 08/18/21  4:21 PM   Specimen: Abscess  Result Value Ref Range Status   Specimen Description   Final    ABSCESS Performed at [facility] [address] [place], [place] [address]    Special Requests   Final    PELVIC Performed at [facility], [place] [address] [place], [place] [address]    Gram Stain   Final    ABUNDANT WBC PRESENT, PREDOMINANTLY PMN ABUNDANT GRAM NEGATIVE RODS FEW GRAM POSITIVE COCCI IN PAIRS IN CLUSTERS FEW GRAM POSITIVE RODS    Culture   Final    ABUNDANT ESCHERICHIA COLI ABUNDANT BACTEROIDES THETAIOTAOMICRON BETA LACTAMASE POSITIVE Performed at [facility], [place] [address] [place], [place] [address]    Report Status 08/25/2021 FINAL  Final   Organism ID, Bacteria ESCHERICHIA COLI  Final      Susceptibility   Escherichia coli - MIC*    AMPICILLIN >=32 RESISTANT Resistant     CEFAZOLIN <=4 SENSITIVE Sensitive     CEFEPIME <=0.12 SENSITIVE Sensitive     CEFTAZIDIME <=1 SENSITIVE Sensitive     CEFTRIAXONE <=0.25 SENSITIVE Sensitive     CIPROFLOXACIN <=0.25 SENSITIVE Sensitive     GENTAMICIN <=1 SENSITIVE Sensitive     IMIPENEM <=0.25 SENSITIVE Sensitive     TRIMETH/SULFA >=320 RESISTANT Resistant     AMPICILLIN/SULBACTAM 8 SENSITIVE Sensitive     PIP/TAZO <=4 SENSITIVE Sensitive     * ABUNDANT ESCHERICHIA COLI    Antimicrobials: Anti-infectives (From admission, onward)    Start     Dose/Rate Route Frequency Ordered Stop   08/21/21 1000  anidulafungin (ERAXIS) 100 mg in sodium chloride 0.9 % 100 mL IVPB        100 mg 78 mL/hr over 100 Minutes Intravenous  Every 24 hours 08/20/21 0724     08/20/21 0900  anidulafungin (ERAXIS) 200 mg in sodium chloride 0.9 %  200 mL IVPB        200 mg 78 mL/hr over 200 Minutes Intravenous  Once 08/20/21 0722 08/20/21 1347   08/17/21 1730  hydroxychloroquine (PLAQUENIL) tablet 200 mg  Status:  Discontinued        200 mg Oral Daily 08/17/21 1630 08/20/21 1225   08/16/21 1815  piperacillin-tazobactam (ZOSYN) IVPB 3.375 g        3.375 g 12.5 mL/hr over 240 Minutes Intravenous Every 8 hours 08/16/21 1805        Culture/Microbiology    Component Value Date/Time   SDES  08/18/2021 1621    ABSCESS Performed at [facility], [place] [address], [place], [place] [address]    SPECREQUEST  08/18/2021 1621    PELVIC Performed at [facility], [place] [address], [place], [place] [address]    CULT  08/18/2021 1621    ABUNDANT ESCHERICHIA COLI ABUNDANT BACTEROIDES THETAIOTAOMICRON BETA LACTAMASE POSITIVE Performed at [facility], [place] [address] [place], [place] [address]    REPTSTATUS 08/25/2021 FINAL 08/18/2021 1621    Other culture-see note  Radiology Studies: DG FLUORO GUIDED NEEDLE PLC ASPIRATION/INJECTION LOC  Result Date: 08/24/2021 CLINICAL DATA:  Transgluteal drain.  Evaluate for fistula. EXAM: RIGHT TRANSGLUTEAL DRAIN INJECTION (SINOGRAM) COMPARISON:  CT AP, 08/21/2021.  IR CT, 08/18/2021. CONTRAST:  50 mL Omnipaque 300-administered via the existing percutaneous drain. FLUOROSCOPY: 1.0 minutes.  24.8 mGy. TECHNIQUE: The patient was positioned prone on the fluoroscopy table. A preprocedural spot fluoroscopic image was obtained of the RIGHT transgluteal drainage catheter. Multiple spot fluoroscopic and radiographic images in AP, oblique and lateral positioning were obtained following the injection of a small amount of contrast via the existing percutaneous drainage catheter. FINDINGS: RIGHT transgluteal drain injection with a small volume of residual pelvic collection. No fluoroscopic evidence of enteric fistula. IMPRESSION: RIGHT transgluteal drain injection with small  volume residual pelvic collection. No fluoroscopic evidence of fistula. Electronically Signed   By: Michaelle Birks M.D.   On: 08/24/2021 19:49     LOS: 8 days   Antonieta Pert, MD [facility]  08/25/2021, 11:43 AM

## 2021-08-25 NOTE — Progress Notes (Signed)
Referring Physician(s): Gross,S  Supervising Physician: Corrie Mckusick  Patient Status:  [facility] - In-pt  Chief Complaint: Pelvic abscess   Subjective: Pt without acute changes   Allergies: Patient has no known allergies.  Medications: Prior to Admission medications   Medication Sig Start Date End Date Taking? Authorizing Provider  buPROPion (WELLBUTRIN XL) 300 MG 24 hr tablet Take 300 mg by mouth daily. 07/29/21  Yes [provider]  escitalopram (LEXAPRO) 10 MG tablet Take 10 mg by mouth daily. 07/17/21  Yes [provider]  hydroxychloroquine (PLAQUENIL) 200 MG tablet Take 200 mg by mouth daily.   Yes [provider]  ibuprofen (ADVIL) 200 MG tablet Take 200 mg by mouth every 6 (six) hours as needed.   Yes [provider]  levothyroxine (SYNTHROID, LEVOTHROID) 100 MCG tablet Take 100 mcg by mouth daily before breakfast.   Yes [provider]  ondansetron (ZOFRAN ODT) 4 MG disintegrating tablet Take 1 tablet (4 mg total) by mouth every 8 (eight) hours as needed for nausea or vomiting. Patient not taking: Reported on 08/17/2021 02/15/21   Claud Kelp, MD     Vital Signs: BP 117/86 (BP Location: Left Arm)    Pulse 74    Temp 98 F (36.7 C) (Oral)    Resp 18    Ht 5\' 5"  (1.651 m)    Wt 125 lb (56.7 kg)    SpO2 95%    BMI 20.80 kg/m   Physical Exam awake/alert; TG drain intact, insertion site ok, not sig tender, OP 30 cc serosang fluid  Imaging: CT ABDOMEN PELVIS W CONTRAST  Result Date: 08/21/2021 CLINICAL DATA:  Abdominal pain EXAM: CT ABDOMEN AND PELVIS WITH CONTRAST TECHNIQUE: Multidetector CT imaging of the abdomen and pelvis was performed using the standard protocol following bolus administration of intravenous contrast. RADIATION DOSE REDUCTION: This exam was performed according to the departmental dose-optimization program which includes automated exposure control, adjustment of the mA and/or kV according to patient size and/or  use of iterative reconstruction technique. CONTRAST:  13mL OMNIPAQUE IOHEXOL 300 MG/ML  SOLN COMPARISON:  08/16/2021 FINDINGS: Lower chest: Coronary artery calcifications are seen. There is interval appearance of small bilateral pleural effusions. There are new infiltrates in the posterior lower lung fields with air bronchograms on both sides suggesting atelectasis/pneumonia. There are new patchy ground-glass infiltrates in the right middle lobe right upper lobe and to a lesser extent in the anterior left upper lobe. Hepatobiliary: Liver measures 18.9 cm in length. No focal abnormality is seen. Gallbladder is distended. There is no wall thickening or pericholecystic fluid collection. There is slight prominence of bile ducts. Pancreas: No focal abnormality is seen. Spleen: Unremarkable. Adrenals/Urinary Tract: Adrenals are unremarkable. There is no hydronephrosis. There are no renal or ureteral stones. Stomach/Bowel: Small hiatal hernia is seen. Stomach is unremarkable. There is dilation of proximal small-bowel loops measuring up to 4.3 cm. There is no significant dilation of distal small bowel loops. Appendix is not distinctly visualized. There is fluid in the lumen of right colon. There is no significant wall thickening in colon. There is interval placement of pigtail drainage catheter in the posterior pelvis with almost complete resolution of large loculated fluid collection in the cul-de-sac. Small residual pocket of fluid is seen in the right side of pelvis measuring proximally 2.4 x 0.8 cm. Vascular/Lymphatic: Scattered arterial calcifications are seen. Reproductive: Unremarkable. Other: There is no ascites or pneumoperitoneum. There is edema in the subcutaneous plane. Small umbilical hernia containing fat is  seen. Musculoskeletal: Unremarkable. IMPRESSION: There is dilation of proximal small bowel loops suggesting possible ileus. Less likely possibility would be partial small bowel obstruction. There is no  ascites or pneumoperitoneum. There is no hydronephrosis. There is almost complete resolution of loculated fluid collection in cul-de-sac in pelvis after placement of drainage catheter. There is small residual linear 2.4 x 0.8 cm loculated fluid collection in the right pelvic cavity. Enlarged liver. Gallbladder is distended, possibly due to fasting state. If there is clinical suspicion for acute cholecystitis, gallbladder sonogram may be considered. Small hiatal hernia. Small bilateral pleural effusions. There are dense infiltrates in both lower lobes suggesting atelectasis/pneumonia. There are new faint ground-glass infiltrates in the right middle lobe, right upper lobe and left upper lobe suggesting multifocal pneumonia. Other findings as described in the body of the report. Electronically Signed   By: Elmer Picker M.D.   On: 08/21/2021 16:01   DG FLUORO GUIDED NEEDLE PLC ASPIRATION/INJECTION LOC  Result Date: 08/24/2021 CLINICAL DATA:  Transgluteal drain.  Evaluate for fistula. EXAM: RIGHT TRANSGLUTEAL DRAIN INJECTION (SINOGRAM) COMPARISON:  CT AP, 08/21/2021.  IR CT, 08/18/2021. CONTRAST:  50 mL Omnipaque 300-administered via the existing percutaneous drain. FLUOROSCOPY: 1.0 minutes.  24.8 mGy. TECHNIQUE: The patient was positioned prone on the fluoroscopy table. A preprocedural spot fluoroscopic image was obtained of the RIGHT transgluteal drainage catheter. Multiple spot fluoroscopic and radiographic images in AP, oblique and lateral positioning were obtained following the injection of a small amount of contrast via the existing percutaneous drainage catheter. FINDINGS: RIGHT transgluteal drain injection with a small volume of residual pelvic collection. No fluoroscopic evidence of enteric fistula. IMPRESSION: RIGHT transgluteal drain injection with small volume residual pelvic collection. No fluoroscopic evidence of fistula. Electronically Signed   By: Michaelle Birks M.D.   On: 08/24/2021 19:49     Labs:  CBC: Recent Labs    08/22/21 0547 08/23/21 0540 08/24/21 0606 08/25/21 0606  WBC 17.0* 15.2* 14.9* 15.4*  HGB 10.6* 10.3* 11.0* 9.9*  HCT 31.1* 29.2* 32.6* 29.3*  PLT 441* 468* 178 486*    COAGS: Recent Labs    08/18/21 0622  INR 1.1    BMP: Recent Labs    08/22/21 0547 08/23/21 0540 08/24/21 0606 08/25/21 0606  NA 135 132* 133* 131*  K 3.4* 2.8* 3.6 3.4*  CL 96* 94* 96* 97*  CO2 29 29 30 29   GLUCOSE 83 93 108* 103*  BUN <5* <5* <5* 6  CALCIUM 8.4* 7.9* 8.3* 8.1*  CREATININE 0.50 0.37* 0.47 0.37*  GFRNONAA >60 >60 >60 >60    LIVER FUNCTION TESTS: Recent Labs    08/16/21 1554 08/17/21 1353 08/18/21 0622  BILITOT 0.5  --   --   AST 12*  --   --   ALT 7  --   --   ALKPHOS 44  --   --   PROT 6.9  --   --   ALBUMIN 3.7 2.9* 2.6*    Assessment and Plan: 61 y.o. female ex-smoker with past medical history significant for lupus, arthritis, anxiety disorder, and hypothyroidism ; now with pelvic pain, leukocytosis, pelvic abscess, s/p 12 fr drain placement via TG approach on 08/18/21; drain to JP bulb; afebrile; WBC 15.4(14.9), hgb 9.9(11), creat 0.37; cx- e coli/bacteroides; following d/w CCS decision made to remove drain; pelvic drain removed in its entirety without immediate complications; gauze dressing applied over site.    Electronically Signed: D. Rowe Robert, PA-C 08/25/2021, 1:24 PM   I spent a total  of 15 Minutes at the the patient's bedside AND on the patient's hospital floor or unit, greater than 50% of which was counseling/coordinating care for pelvic abscess drain    Patient ID: Deanna Fischer, female   DOB: 1960/09/17, 61 y.o.   MRN: [id number]

## 2021-08-25 NOTE — Progress Notes (Signed)
Chief Complaint/Subjective: Still tolerating full liquids but has had mostly ensure and not tried anything more substantial than ensure. No further nausea or emesis. Abdominal pain is a little worse today than yesterday in the suprapubic region. Cough is much better. Still passing flatus. Starting to "feel constipated." Last BM yesterday  Objective: Vital signs in last 24 hours: Temp:  [98.2 F (36.8 C)-98.9 F (37.2 C)] 98.2 F (36.8 C) (02/22 0533) Pulse Rate:  [73-78] 73 (02/22 0533) Resp:  [16-18] 18 (02/22 0533) BP: (116-125)/(86-98) 116/86 (02/22 0533) SpO2:  [92 %-94 %] 93 % (02/22 0533) Last BM Date : 08/24/21 (very small per pt) Intake/Output from previous day: 02/21 0701 - 02/22 0700 In: 40 [P.O.:470] Out: 30 [Drains:30] Intake/Output this shift: Total I/O In: 240 [P.O.:240] Out: -   PE: Gen: NAD Resp: nonlabored on room air Card: RRR Abd: soft, slight distension, drain with serosanguinous fluid. Mild TTP in suprapubic region without rebound or gaurding MSK: no edema, calves soft and NT bilaterally  Lab Results:  Recent Labs    08/24/21 0606 08/25/21 0606  WBC 14.9* 15.4*  HGB 11.0* 9.9*  HCT 32.6* 29.3*  PLT 178 486*    BMET Recent Labs    08/24/21 0606 08/25/21 0606  NA 133* 131*  K 3.6 3.4*  CL 96* 97*  CO2 30 29  GLUCOSE 108* 103*  BUN <5* 6  CREATININE 0.47 0.37*  CALCIUM 8.3* 8.1*    PT/INR No results for input(s): LABPROT, INR in the last 72 hours. CMP     Component Value Date/Time   NA 131 (L) 08/25/2021 0606   K 3.4 (L) 08/25/2021 0606   CL 97 (L) 08/25/2021 0606   CO2 29 08/25/2021 0606   GLUCOSE 103 (H) 08/25/2021 0606   BUN 6 08/25/2021 0606   CREATININE 0.37 (L) 08/25/2021 0606   CALCIUM 8.1 (L) 08/25/2021 0606   PROT 6.9 08/16/2021 1554   ALBUMIN 2.6 (L) 08/18/2021 0622   AST 12 (L) 08/16/2021 1554   ALT 7 08/16/2021 1554   ALKPHOS 44 08/16/2021 1554   BILITOT 0.5 08/16/2021 1554   GFRNONAA >60 08/25/2021 0606    Lipase     Component Value Date/Time   LIPASE 28 08/16/2021 1554    Studies/Results: DG FLUORO GUIDED NEEDLE PLC ASPIRATION/INJECTION LOC  Result Date: 08/24/2021 CLINICAL DATA:  Transgluteal drain.  Evaluate for fistula. EXAM: RIGHT TRANSGLUTEAL DRAIN INJECTION (SINOGRAM) COMPARISON:  CT AP, 08/21/2021.  IR CT, 08/18/2021. CONTRAST:  50 mL Omnipaque 300-administered via the existing percutaneous drain. FLUOROSCOPY: 1.0 minutes.  24.8 mGy. TECHNIQUE: The patient was positioned prone on the fluoroscopy table. A preprocedural spot fluoroscopic image was obtained of the RIGHT transgluteal drainage catheter. Multiple spot fluoroscopic and radiographic images in AP, oblique and lateral positioning were obtained following the injection of a small amount of contrast via the existing percutaneous drainage catheter. FINDINGS: RIGHT transgluteal drain injection with a small volume of residual pelvic collection. No fluoroscopic evidence of enteric fistula. IMPRESSION: RIGHT transgluteal drain injection with small volume residual pelvic collection. No fluoroscopic evidence of fistula. Electronically Signed   By: Michaelle Birks M.D.   On: 08/24/2021 19:49    Anti-infectives: Anti-infectives (From admission, onward)    Start     Dose/Rate Route Frequency Ordered Stop   08/21/21 1000  anidulafungin (ERAXIS) 100 mg in sodium chloride 0.9 % 100 mL IVPB        100 mg 78 mL/hr over 100 Minutes Intravenous Every 24  hours 08/20/21 0724     08/20/21 0900  anidulafungin (ERAXIS) 200 mg in sodium chloride 0.9 % 200 mL IVPB        200 mg 78 mL/hr over 200 Minutes Intravenous  Once 08/20/21 0722 08/20/21 1347   08/17/21 1730  hydroxychloroquine (PLAQUENIL) tablet 200 mg  Status:  Discontinued        200 mg Oral Daily 08/17/21 1630 08/20/21 1225   08/16/21 1815  piperacillin-tazobactam (ZOSYN) IVPB 3.375 g        3.375 g 12.5 mL/hr over 240 Minutes Intravenous Every 8 hours 08/16/21 1805          Assessment/Plan Pelvic abscess Chronic peritonitis - Infectious vs inflammatory enteritis vs appendicitis, possible diverticulitis vs crohn's Leukocytosis  - CT 2/13 showing: "1. Constellation of findings, most consistent with an inflammatory lower abdominal and pelvic process, including developing cul-de-sac abscess. Question if this represents infectious or inflammatory enteritis versus appendicitis. 2. Mild small bowel dilatation, suggesting secondary adynamic ileus." - WBC 15.4 (14.9), afebrile, continue IV abx - lactic acid normal 2/13 - IR transgluteal drain 2/15 - 15 ml purulent fluid aspirated with placement. Drain study 2/20 without fistula. Remove prior to discharge - continue IV abx, drain culture - e coli, with several sensitivities  - eraxis added 2/17 - cough - present since PTA. Lasix 2/19 - hypokalemia - repletion ordered  - No urgent/emergent surgery recommended at this time and favor continued IV abx, drain, and repeat imaging. Monitor abdominal exam and labs and should she acutely worsen then operative intervention may become acutely necessary. Continue to plan for colonoscopy outpatient once inflammation resolved pending acute course -CT scan 2/18 negative for abscess  Not sure why her WBC remains elevated but will continue to monitor abdominal exam. Last CT 4 days ago with improvement but may consider repeat CT scan in next day or so pending improvement  FEN: full liquids ADAT soft lunch/dinner ID: [email or web address] 2/13 >>, eraxis 2/17>> VTE: lovenox   Lupus - on plaquenil hypothyroidism     LOS: 8 days   I reviewed hospitalist notes, last 24 h vitals and pain scores, last 48 h intake and output, and last 24 h labs and trends.  This care required moderate level of medical decision making.   [facility] 08/25/2021, 11:17 AM Please see Amion for pager number during day hours 7:00am-4:30pm or 7:00am -11:30am on weekends

## 2021-08-26 ENCOUNTER — Other Ambulatory Visit (HOSPITAL_COMMUNITY): Payer: Self-pay

## 2021-08-26 LAB — CBC
HCT: 29.2 % — ABNORMAL LOW (ref 36.0–46.0)
Hemoglobin: 9.9 g/dL — ABNORMAL LOW (ref 12.0–15.0)
MCH: 33.9 pg (ref 26.0–34.0)
MCHC: 33.9 g/dL (ref 30.0–36.0)
MCV: 100 fL (ref 80.0–100.0)
Platelets: 512 10*3/uL — ABNORMAL HIGH (ref 150–400)
RBC: 2.92 MIL/uL — ABNORMAL LOW (ref 3.87–5.11)
RDW: 13.5 % (ref 11.5–15.5)
WBC: 14.1 10*3/uL — ABNORMAL HIGH (ref 4.0–10.5)
nRBC: 0 % (ref 0.0–0.2)

## 2021-08-26 LAB — GLUCOSE, CAPILLARY
Glucose-Capillary: 100 mg/dL — ABNORMAL HIGH (ref 70–99)
Glucose-Capillary: 103 mg/dL — ABNORMAL HIGH (ref 70–99)
Glucose-Capillary: 90 mg/dL (ref 70–99)
Glucose-Capillary: 92 mg/dL (ref 70–99)

## 2021-08-26 MED ORDER — METHOCARBAMOL 500 MG PO TABS
500.0000 mg | ORAL_TABLET | Freq: Three times a day (TID) | ORAL | Status: DC
  Administered 2021-08-26 – 2021-08-29 (×8): 500 mg via ORAL
  Filled 2021-08-26 (×8): qty 1

## 2021-08-26 MED ORDER — IBUPROFEN 800 MG PO TABS
800.0000 mg | ORAL_TABLET | Freq: Three times a day (TID) | ORAL | Status: DC | PRN
  Administered 2021-08-26 – 2021-08-29 (×6): 800 mg via ORAL
  Filled 2021-08-26 (×6): qty 1

## 2021-08-26 MED ORDER — HYDROXYCHLOROQUINE SULFATE 200 MG PO TABS
200.0000 mg | ORAL_TABLET | Freq: Every day | ORAL | Status: DC
  Administered 2021-08-26 – 2021-08-29 (×4): 200 mg via ORAL
  Filled 2021-08-26 (×4): qty 1

## 2021-08-26 MED ORDER — IBUPROFEN 200 MG PO TABS
200.0000 mg | ORAL_TABLET | Freq: Four times a day (QID) | ORAL | Status: DC | PRN
  Administered 2021-08-26: 200 mg via ORAL
  Filled 2021-08-26: qty 1

## 2021-08-26 NOTE — Progress Notes (Signed)
Chief Complaint/Subjective: Feeling better today than yesterday. Tolerated soft diet and had bm this am. Abdominal pain improving. No nausea or emesis. Drain out yesterday  Objective: Vital signs in last 24 hours: Temp:  [98 F (36.7 C)-98.3 F (36.8 C)] 98.3 F (36.8 C) (02/23 0454) Pulse Rate:  [71-79] 79 (02/23 0454) Resp:  [14-18] 14 (02/23 0454) BP: (104-117)/(74-86) 115/83 (02/23 0454) SpO2:  [91 %-95 %] 91 % (02/23 0454) Last BM Date : 08/24/21 (very small per pt) Intake/Output from previous day: 02/22 0701 - 02/23 0700 In: 1727.8 [P.O.:600; IV Piggyback:1127.8] Out: -  Intake/Output this shift: No intake/output data recorded.  PE: Gen: NAD Resp: nonlabored on room air Card: RRR Abd: soft, NT, ND MSK: no edema, calves soft and NT bilaterally  Lab Results:  Recent Labs    08/25/21 0606 08/26/21 0607  WBC 15.4* 14.1*  HGB 9.9* 9.9*  HCT 29.3* 29.2*  PLT 486* 512*    BMET Recent Labs    08/24/21 0606 08/25/21 0606  NA 133* 131*  K 3.6 3.4*  CL 96* 97*  CO2 30 29  GLUCOSE 108* 103*  BUN <5* 6  CREATININE 0.47 0.37*  CALCIUM 8.3* 8.1*    PT/INR No results for input(s): LABPROT, INR in the last 72 hours. CMP     Component Value Date/Time   NA 131 (L) 08/25/2021 0606   K 3.4 (L) 08/25/2021 0606   CL 97 (L) 08/25/2021 0606   CO2 29 08/25/2021 0606   GLUCOSE 103 (H) 08/25/2021 0606   BUN 6 08/25/2021 0606   CREATININE 0.37 (L) 08/25/2021 0606   CALCIUM 8.1 (L) 08/25/2021 0606   PROT 6.9 08/16/2021 1554   ALBUMIN 2.6 (L) 08/18/2021 0622   AST 12 (L) 08/16/2021 1554   ALT 7 08/16/2021 1554   ALKPHOS 44 08/16/2021 1554   BILITOT 0.5 08/16/2021 1554   GFRNONAA >60 08/25/2021 0606   Lipase     Component Value Date/Time   LIPASE 28 08/16/2021 1554    Studies/Results: No results found.  Anti-infectives: Anti-infectives (From admission, onward)    Start     Dose/Rate Route Frequency Ordered Stop   08/21/21 1000  anidulafungin  (ERAXIS) 100 mg in sodium chloride 0.9 % 100 mL IVPB        100 mg 78 mL/hr over 100 Minutes Intravenous Every 24 hours 08/20/21 0724     08/20/21 0900  anidulafungin (ERAXIS) 200 mg in sodium chloride 0.9 % 200 mL IVPB        200 mg 78 mL/hr over 200 Minutes Intravenous  Once 08/20/21 0722 08/20/21 1347   08/17/21 1730  hydroxychloroquine (PLAQUENIL) tablet 200 mg  Status:  Discontinued        200 mg Oral Daily 08/17/21 1630 08/20/21 1225   08/16/21 1815  piperacillin-tazobactam (ZOSYN) IVPB 3.375 g        3.375 g 12.5 mL/hr over 240 Minutes Intravenous Every 8 hours 08/16/21 1805         Assessment/Plan Pelvic abscess Chronic peritonitis - Infectious vs inflammatory enteritis vs appendicitis, possible diverticulitis vs crohn's Leukocytosis  - CT 2/13 showing: "1. Constellation of findings, most consistent with an inflammatory lower abdominal and pelvic process, including developing cul-de-sac abscess. Question if this represents infectious or inflammatory enteritis versus appendicitis. 2. Mild small bowel dilatation, suggesting secondary adynamic ileus." - WBC 15.4 (14.9), afebrile, continue IV abx - lactic acid normal 2/13 - IR transgluteal drain 2/15 - 15 ml purulent fluid aspirated  with placement. Drain study 2/20 without fistula. Drain removed 2/22 - continue IV abx, drain culture - e coli, with several sensitivities  - eraxis added 2/17 - cough - present since PTA. Lasix 2/19 -CT scan 2/18 negative for abscess  - No urgent/emergent surgery recommended at this time. Continue to plan for colonoscopy outpatient once inflammation resolved   Likely stable for dc tomorrow am from surgical perspective. Will discuss duration of antibiotics with MD. She will need to follow up with GI - I have spoken with [facility] and placed their information in her discharge paperwork. Will arrange follow up Dr. Johney Maine  FEN: soft ID: [email or web address] 2/13 >>, eraxis 2/17>> VTE: lovenox   Lupus - on plaquenil  - reordered today hypothyroidism     LOS: 9 days   I reviewed hospitalist notes, last 24 h vitals and pain scores, last 48 h intake and output, and last 24 h labs and trends.  This care required moderate level of medical decision making.   [facility] 08/26/2021, 7:57 AM Please see Amion for pager number during day hours 7:00am-4:30pm or 7:00am -11:30am on weekends

## 2021-08-26 NOTE — Discharge Instructions (Addendum)
Continue a low fiber diet for the next 1-2 weeks before transitioning to a high fiber diet  If patient does not wish to contact the [facility] and [facility] (information attached to AVS), she can contact her insurance company [employer] [facility]) # [phone] for a list of in-network/preferred providers

## 2021-08-26 NOTE — Progress Notes (Signed)
JP drain dressing change per pt request.

## 2021-08-26 NOTE — Progress Notes (Signed)
PROGRESS NOTE Deanna Fischer  VQQ:[id number] DOB: [birth year] DOA: 08/16/2021 PCP: Patient, No Pcp Per (Inactive)   Brief Narrative/Hospital Course: 39yof w. Hx of lupus and hypothyroidism who presents with several days of abdominal pain and N/V/D , inflammatory process in lower abdomen on CT which could reflect enteritis or appendicitis per radiology, also have pelvic abscess status post drain, IR/general surgery following .WBC continue to trend up on broad-spectrum antibiotic and antifungal on board.  WBC now finally improving BMP stable.  Being treated for pelvic abscess, hyponatremia hypokalemia hypophosphatemia hypomagnesemia lupus and hypothyroidism.  Abscess culture from 2/15 showed abundant E. coli sensitive to cefepime ceftriaxone quinolone.     Subjective:  Seen and examined this morning.  Bleeding in the room patient reports he feels much improved but still feels bloated, having bowel movement no nausea vomiting no fever WBC count are up but downtrending. needing oxycodone and IV morphine for pain control   Assessment and Plan: * Pelvic abscess in female Pelvic abscess POA Chronic peritonitis-infectious versus inflammatory enteritis versus appendicitis, possible diverticulitis versus Crohn's: surgery,IR on board-s/p transgluteal drain 2/15, w/ 15 mL purulent fluid aspirated, drain study  2/20 completed Drain culture with E. Coli, on IV antibiotics with Zosyn since 2/13 and antifungal Eraxis 2/17. CT scan 2/18 negative for abscess. Patient's drain is out.Will need colonoscopy outpatient once inflammation resolves. Continue pain control diet as tolerated monitor WBC count clinical status if remains stable anticipate discharge tomorrow. Await surgery recommendation regarding antibiotics need  Chronic peritonitis (Deanna Fischer) See above  Electrolyte imbalance Repleted.  Monitor Recent Labs  Lab 08/20/21 0529 08/21/21 0826 08/22/21 0547 08/23/21 0540 08/24/21 0606 08/25/21 0606  K  2.9* 3.2* 3.4* 2.8* 3.6 3.4*  CALCIUM 8.4* 8.2* 8.4* 7.9* 8.3* 8.1*  MG 1.8  --  1.9 1.7 2.1 1.8  PHOS 3.4  --   --  2.9  --   --     Systemic lupus erythematosus (Deanna Fischer)- (present on admission) Continue her hydroxychloroquine, followed by [facility] rheumatology  Depression with anxiety- (present on admission) Mood stable, on home meds  Normocytic anemia- (present on admission) Holding in 9 to 10 g range.  Monitor Recent Labs  Lab 08/22/21 0547 08/23/21 0540 08/24/21 0606 08/25/21 0606 08/26/21 0607  HGB 10.6* 10.3* 11.0* 9.9* 9.9*  HCT 31.1* 29.2* 32.6* 29.3* 29.2*    Hypothyroidism- (present on admission) Continue Synthroid  DVT prophylaxis: enoxaparin (LOVENOX) injection 40 mg Start: 08/24/21 1200 Place and maintain sequential compression device Start: 08/19/21 0801 SCDs Start: 08/17/21 1328 Code Status:   Code Status: Full Code Family Communication: plan of care discussed with patient at bedside.  Disposition: Currently not medically stable for discharge. Status is: Inpatient Remains inpatient appropriate because: Ongoing management of patient's abscess drain Objective: Vitals last 24 hrs: Vitals:   08/25/21 0533 08/25/21 1307 08/25/21 2138 08/26/21 0454  BP: 116/86 117/86 104/74 115/83  Pulse: 73 74 71 79  Resp: 18 18 17 14   Temp: 98.2 F (36.8 C) 98 F (36.7 C) 98.2 F (36.8 C) 98.3 F (36.8 C)  TempSrc: Oral Oral Oral Oral  SpO2: 93% 95% 92% 91%  Weight:      Height:       Weight change:   Physical Examination: General exam: AA0X3, older than stated age, weak appearing. HEENT:Oral mucosa moist, Ear/Nose WNL grossly, dentition normal. Respiratory system: bilaterally diminished, no use of accessory muscle Cardiovascular system: S1 & S2 +, No JVD,. Gastrointestinal system: Abdomen soft,NT, bloated , dressing in place,BS+ Nervous System:Alert, awake, moving  extremities and grossly nonfocal Extremities: LE ankle edema NONE, distal peripheral pulses  palpable.  Skin: No rashes,no icterus. MSK: Normal muscle bulk,tone, power   Medications reviewed:  Scheduled Meds:  acetaminophen  1,000 mg Oral Q6H   buPROPion  300 mg Oral Daily   docusate sodium  100 mg Oral Daily   enoxaparin (LOVENOX) injection  40 mg Subcutaneous Q24H   escitalopram  10 mg Oral Daily   feeding supplement  237 mL Oral BID BM   guaiFENesin  1,200 mg Oral BID   hydroxychloroquine  200 mg Oral Daily   levothyroxine  100 mcg Oral Q0600   pantoprazole  40 mg Oral Q1200   polyethylene glycol  17 g Oral Daily   protein supplement  2 oz Oral BID   senna-docusate  1 tablet Oral QHS   sodium chloride flush  10 mL Intracatheter Q12H   Continuous Infusions:  sodium chloride     anidulafungin 100 mg (08/26/21 0936)   methocarbamol (ROBAXIN) IV 500 mg (08/26/21 0508)   piperacillin-tazobactam (ZOSYN)  IV 3.375 g (08/26/21 1058)   promethazine (PHENERGAN) injection (IM or IVPB)        Diet Order             DIET SOFT Room service appropriate? Yes; Fluid consistency: Thin  Diet effective now                            Intake/Output Summary (Last 24 hours) at 08/26/2021 1105 Last data filed at 08/25/2021 1841 Gross per 24 hour  Intake 1487.75 ml  Output --  Net 1487.75 ml   Net IO Since Admission: 8,810.74 mL [08/26/21 1105]  Wt Readings from Last 3 Encounters:  08/16/21 56.7 kg  02/15/21 56.7 kg     Unresulted Labs (From admission, onward)     Start     Ordered   08/27/21 0500  CBC  Tomorrow morning,   R        08/26/21 0845   08/27/21 3299  Basic metabolic panel  Tomorrow morning,   R        08/26/21 0845   08/27/21 0500  Magnesium  Tomorrow morning,   R        08/26/21 0845   08/23/21 0500  CBC  Every Mon-Wed-Fri (0500),   R      08/20/21 0807          Data Reviewed: I have personally reviewed following labs and imaging studies CBC: Recent Labs  Lab 08/20/21 0529 08/21/21 0826 08/22/21 0547 08/23/21 0540 08/24/21 0606  08/25/21 0606 08/26/21 0607  WBC 14.6* 15.7* 17.0* 15.2* 14.9* 15.4* 14.1*  NEUTROABS 11.8* 12.2*  --   --   --   --   --   HGB 10.9* 11.1* 10.6* 10.3* 11.0* 9.9* 9.9*  HCT 31.7* 32.0* 31.1* 29.2* 32.6* 29.3* 29.2*  MCV 95.8 97.0 97.8 94.5 95.9 98.0 100.0  PLT 375 427* 441* 468* 178 486* 242*   Basic Metabolic Panel: Recent Labs  Lab 08/20/21 0529 08/21/21 0826 08/22/21 0547 08/23/21 0540 08/24/21 0606 08/25/21 0606  NA 132* 133* 135 132* 133* 131*  K 2.9* 3.2* 3.4* 2.8* 3.6 3.4*  CL 97* 98 96* 94* 96* 97*  CO2 27 26 29 29 30 29   GLUCOSE 105* 85 83 93 108* 103*  BUN <5* <5* <5* <5* <5* 6  CREATININE 0.45 0.50 0.50 0.37* 0.47 0.37*  CALCIUM 8.4* 8.2* 8.4* 7.9*  8.3* 8.1*  MG 1.8  --  1.9 1.7 2.1 1.8  PHOS 3.4  --   --  2.9  --   --    GFR: Estimated Creatinine Clearance: 68.6 mL/min (A) (by C-G formula based on SCr of 0.37 mg/dL (L)). Liver Function Tests: No results for input(s): AST, ALT, ALKPHOS, BILITOT, PROT, ALBUMIN in the last 168 hours.  No results for input(s): LIPASE, AMYLASE in the last 168 hours. No results for input(s): AMMONIA in the last 168 hours. Coagulation Profile: No results for input(s): INR, PROTIME in the last 168 hours.  Cardiac Enzymes: No results for input(s): CKTOTAL, CKMB, CKMBINDEX, TROPONINI in the last 168 hours. BNP (last 3 results) No results for input(s): PROBNP in the last 8760 hours. HbA1C: No results for input(s): HGBA1C in the last 72 hours. CBG: Recent Labs  Lab 08/25/21 0529 08/25/21 1152 08/25/21 1747 08/25/21 2257 08/26/21 0455  GLUCAP 98 107* 108* 98 90   Lipid Profile: No results for input(s): CHOL, HDL, LDLCALC, TRIG, CHOLHDL, LDLDIRECT in the last 72 hours. Thyroid Function Tests: No results for input(s): TSH, T4TOTAL, FREET4, T3FREE, THYROIDAB in the last 72 hours. Anemia Panel: No results for input(s): VITAMINB12, FOLATE, FERRITIN, TIBC, IRON, RETICCTPCT in the last 72 hours. Sepsis Labs: Recent Labs  Lab  08/22/21 0547  LATICACIDVEN 1.3    Recent Results (from the past 240 hour(s))  Resp Panel by RT-PCR (Flu A&B, Covid) Nasopharyngeal Swab     Status: None   Collection Time: 08/16/21  4:00 PM   Specimen: Nasopharyngeal Swab; Nasopharyngeal(NP) swabs in vial transport medium  Result Value Ref Range Status   SARS Coronavirus 2 by RT PCR NEGATIVE NEGATIVE Final    Comment: (NOTE) SARS-CoV-2 target nucleic acids are NOT DETECTED.  The SARS-CoV-2 RNA is generally detectable in upper respiratory specimens during the acute phase of infection. The lowest concentration of SARS-CoV-2 viral copies this assay can detect is 138 copies/mL. A negative result does not preclude SARS-Cov-2 infection and should not be used as the sole basis for treatment or other patient management decisions. A negative result may occur with  improper specimen collection/handling, submission of specimen other than nasopharyngeal swab, presence of viral mutation(s) within the areas targeted by this assay, and inadequate number of viral copies(<138 copies/mL). A negative result must be combined with clinical observations, patient history, and epidemiological information. The expected result is Negative.  Fact Sheet for Patients:  EntrepreneurPulse.com.au  Fact Sheet for Healthcare Providers:  IncredibleEmployment.be  This test is no t yet approved or cleared by the [place] FDA and  has been authorized for detection and/or diagnosis of SARS-CoV-2 by FDA under an Emergency Use Authorization (EUA). This EUA will remain  in effect (meaning this test can be used) for the duration of the COVID-19 declaration under Section 564(b)(1) of the Act, 21 U.S.C.section 360bbb-3(b)(1), unless the authorization is terminated  or revoked sooner.       Influenza A by PCR NEGATIVE NEGATIVE Final   Influenza B by PCR NEGATIVE NEGATIVE Final    Comment: (NOTE) The Xpert Xpress  SARS-CoV-2/FLU/RSV plus assay is intended as an aid in the diagnosis of influenza from Nasopharyngeal swab specimens and should not be used as a sole basis for treatment. Nasal washings and aspirates are unacceptable for Xpert Xpress SARS-CoV-2/FLU/RSV testing.  Fact Sheet for Patients: EntrepreneurPulse.com.au  Fact Sheet for Healthcare Providers: IncredibleEmployment.be  This test is not yet approved or cleared by the [place] and has been authorized for  detection and/or diagnosis of SARS-CoV-2 by FDA under an Emergency Use Authorization (EUA). This EUA will remain in effect (meaning this test can be used) for the duration of the COVID-19 declaration under Section 564(b)(1) of the Act, 21 U.S.C. section 360bbb-3(b)(1), unless the authorization is terminated or revoked.  Performed at [facility], [address], [place], [place] [address]   Blood culture (routine x 2)     Status: None   Collection Time: 08/16/21  6:22 PM   Specimen: BLOOD  Result Value Ref Range Status   Specimen Description   Final    BLOOD BLOOD LEFT FOREARM Performed at [facility], [address] [place], [place] [address]    Special Requests   Final    BOTTLES DRAWN AEROBIC AND ANAEROBIC Blood Culture adequate volume Performed at [facility], [address] [place], [place] [address]    Culture   Final    NO GROWTH 5 DAYS Performed at [facility], [place] [address], [place], [place] [address]    Report Status 08/21/2021 FINAL  Final  Blood culture (routine x 2)     Status: None   Collection Time: 08/16/21  6:25 PM   Specimen: BLOOD LEFT ARM  Result Value Ref Range Status   Specimen Description   Final    BLOOD LEFT ARM Performed at [facility], [address] [place], [place] [address]    Special Requests   Final    BOTTLES DRAWN AEROBIC AND ANAEROBIC Blood  Culture adequate volume Performed at [facility], [address] [place], [place] [address]    Culture   Final    NO GROWTH 5 DAYS Performed at [facility], [place] [address], [place], [place] [address]    Report Status 08/21/2021 FINAL  Final  Aerobic/Anaerobic Culture w Gram Stain (surgical/deep wound)     Status: None   Collection Time: 08/18/21  4:21 PM   Specimen: Abscess  Result Value Ref Range Status   Specimen Description   Final    ABSCESS Performed at [place] [address] [place], [place] [address]    Special Requests   Final    PELVIC Performed at [facility], [place] [address], [place], [place] [address]    Gram Stain   Final    ABUNDANT WBC PRESENT, PREDOMINANTLY PMN ABUNDANT GRAM NEGATIVE RODS FEW GRAM POSITIVE COCCI IN PAIRS IN CLUSTERS FEW GRAM POSITIVE RODS    Culture   Final    ABUNDANT ESCHERICHIA COLI ABUNDANT BACTEROIDES THETAIOTAOMICRON BETA LACTAMASE POSITIVE Performed at [facility], [place] [address] [place], [place] [address]    Report Status 08/25/2021 FINAL  Final   Organism ID, Bacteria ESCHERICHIA COLI  Final      Susceptibility   Escherichia coli - MIC*    AMPICILLIN >=32 RESISTANT Resistant     CEFAZOLIN <=4 SENSITIVE Sensitive     CEFEPIME <=0.12 SENSITIVE Sensitive     CEFTAZIDIME <=1 SENSITIVE Sensitive     CEFTRIAXONE <=0.25 SENSITIVE Sensitive     CIPROFLOXACIN <=0.25 SENSITIVE Sensitive     GENTAMICIN <=1 SENSITIVE Sensitive     IMIPENEM <=0.25 SENSITIVE Sensitive     TRIMETH/SULFA >=320 RESISTANT Resistant     AMPICILLIN/SULBACTAM 8 SENSITIVE Sensitive     PIP/TAZO <=4 SENSITIVE Sensitive     * ABUNDANT ESCHERICHIA COLI    Antimicrobials: Anti-infectives (From admission, onward)    Start     Dose/Rate  Route Frequency Ordered Stop   08/26/21 1000  hydroxychloroquine (PLAQUENIL) tablet 200 mg        200 mg Oral Daily 08/26/21 0913     08/21/21 1000   anidulafungin (ERAXIS) 100 mg in sodium chloride 0.9 % 100 mL IVPB        100 mg 78 mL/hr over 100 Minutes Intravenous Every 24 hours 08/20/21 0724     08/20/21 0900  anidulafungin (ERAXIS) 200 mg in sodium chloride 0.9 % 200 mL IVPB        200 mg 78 mL/hr over 200 Minutes Intravenous  Once 08/20/21 0722 08/20/21 1347   08/17/21 1730  hydroxychloroquine (PLAQUENIL) tablet 200 mg  Status:  Discontinued        200 mg Oral Daily 08/17/21 1630 08/20/21 1225   08/16/21 1815  piperacillin-tazobactam (ZOSYN) IVPB 3.375 g        3.375 g 12.5 mL/hr over 240 Minutes Intravenous Every 8 hours 08/16/21 1805        Culture/Microbiology    Component Value Date/Time   SDES  08/18/2021 1621    ABSCESS Performed at [facility], [place] [address], [place], [place] [address]    SPECREQUEST  08/18/2021 1621    PELVIC Performed at [facility], [place] [address] [place], [place] [address]    CULT  08/18/2021 1621    ABUNDANT ESCHERICHIA COLI ABUNDANT BACTEROIDES THETAIOTAOMICRON BETA LACTAMASE POSITIVE Performed at [facility], [place] [address] [place],  [address]    REPTSTATUS 08/25/2021 FINAL 08/18/2021 1621    Other culture-see note  Radiology Studies: No results found.   LOS: 9 days   Antonieta Pert, MD [facility]  08/26/2021, 11:05 AM

## 2021-08-27 LAB — GLUCOSE, CAPILLARY
Glucose-Capillary: 116 mg/dL — ABNORMAL HIGH (ref 70–99)
Glucose-Capillary: 83 mg/dL (ref 70–99)
Glucose-Capillary: 90 mg/dL (ref 70–99)
Glucose-Capillary: 99 mg/dL (ref 70–99)

## 2021-08-27 LAB — BASIC METABOLIC PANEL
Anion gap: 8 (ref 5–15)
BUN: 6 mg/dL (ref 6–20)
CO2: 27 mmol/L (ref 22–32)
Calcium: 8.4 mg/dL — ABNORMAL LOW (ref 8.9–10.3)
Chloride: 101 mmol/L (ref 98–111)
Creatinine, Ser: 0.43 mg/dL — ABNORMAL LOW (ref 0.44–1.00)
GFR, Estimated: >60 mL/min (ref >60)
Glucose, Bld: 85 mg/dL (ref 70–99)
Potassium: 4.2 mmol/L (ref 3.5–5.1)
Sodium: 136 mmol/L (ref 135–145)

## 2021-08-27 LAB — CBC
HCT: 30.3 % — ABNORMAL LOW (ref 36.0–46.0)
Hemoglobin: 10 g/dL — ABNORMAL LOW (ref 12.0–15.0)
MCH: 32.7 pg (ref 26.0–34.0)
MCHC: 33 g/dL (ref 30.0–36.0)
MCV: 99 fL (ref 80.0–100.0)
Platelets: 568 10*3/uL — ABNORMAL HIGH (ref 150–400)
RBC: 3.06 MIL/uL — ABNORMAL LOW (ref 3.87–5.11)
RDW: 13.4 % (ref 11.5–15.5)
WBC: 11.4 10*3/uL — ABNORMAL HIGH (ref 4.0–10.5)
nRBC: 0 % (ref 0.0–0.2)

## 2021-08-27 LAB — MAGNESIUM: Magnesium: 2.1 mg/dL (ref 1.7–2.4)

## 2021-08-27 IMAGING — CT CT HEAD W/O CM
4 series · 16 of 47 positions shown, 18 images · non-contrast
Comparison: None.

CLINICAL DATA: Fall

EXAM:
CT HEAD WITHOUT CONTRAST
CT MAXILLOFACIAL WITHOUT CONTRAST
CT CERVICAL SPINE WITHOUT CONTRAST
TECHNIQUE: Multidetector CT imaging of the head, cervical spine, and
maxillofacial structures were performed using the standard protocol
without intravenous contrast. Multiplanar CT image reconstructions
of the cervical spine and maxillofacial structures were also
generated.

[Series 3: head without · axial · non-contrast · 0.42mm/px · z∈[-124,+2]mm · 7 of 35 slices shown, 9 images]
[im 5/35  brain]
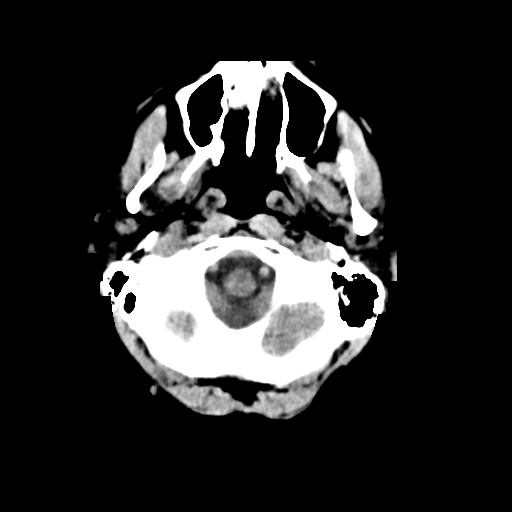
[im 5/35  bone]
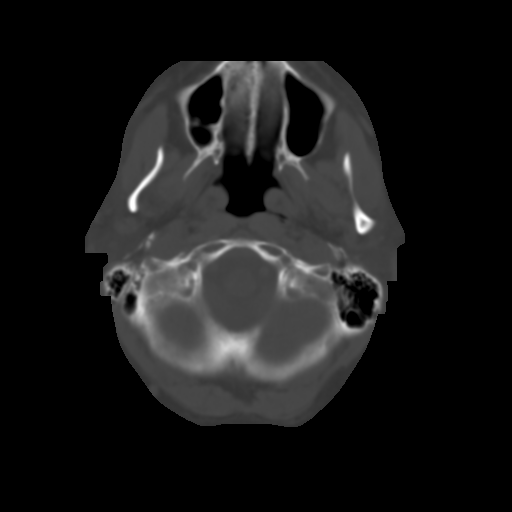
[im 9/35  brain]
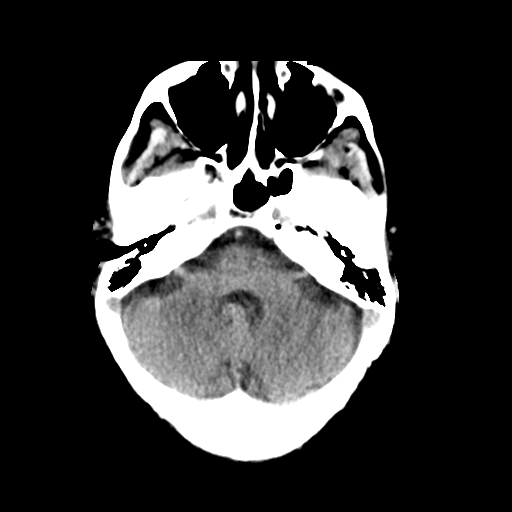
[im 13/35  brain]
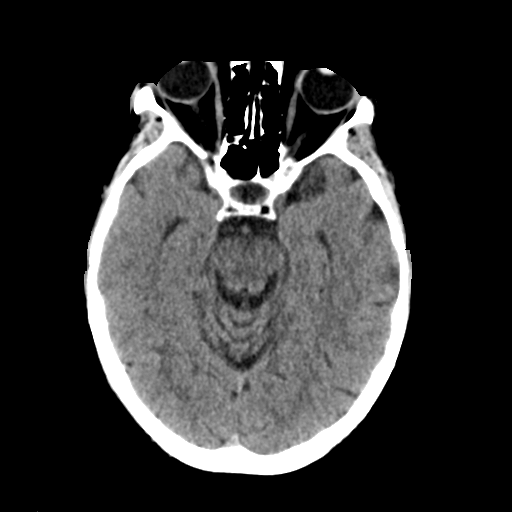
[im 18/35  brain]
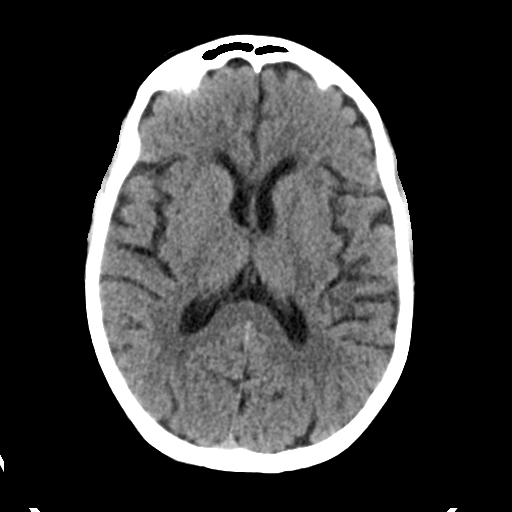
[im 22/35  brain]
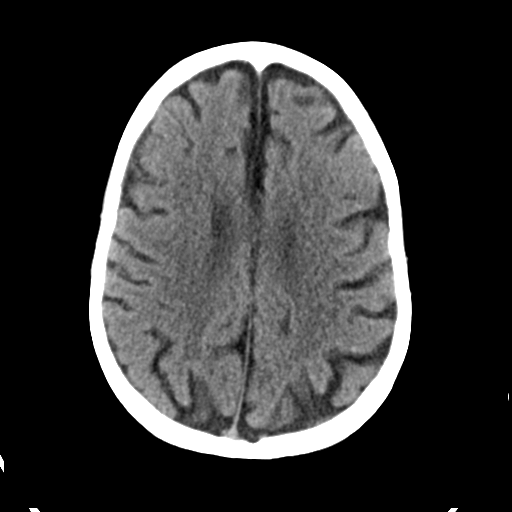
[im 22/35  bone]
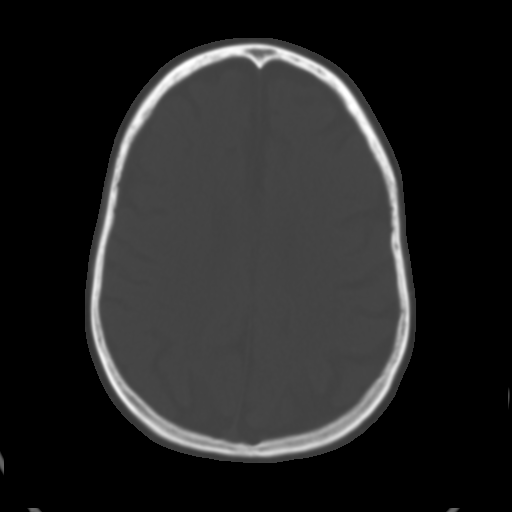
[im 26/35  brain]
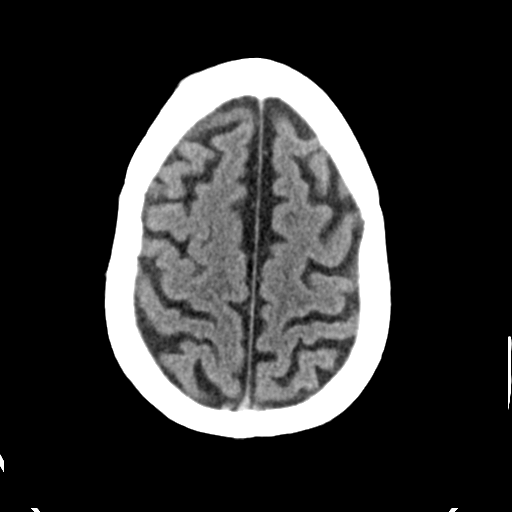
[im 30/35  brain]
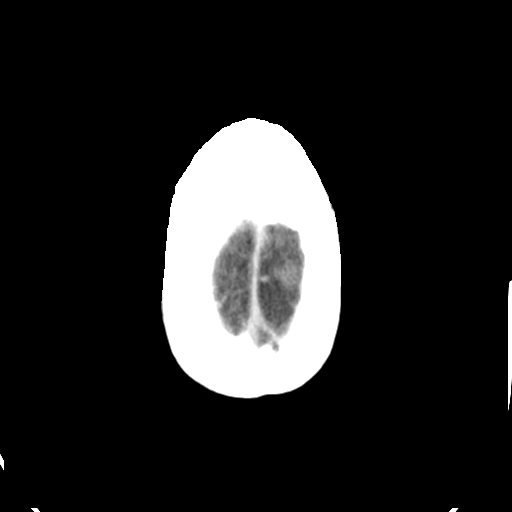

[Series 4: head bone · axial · 0.42mm/px · z∈[-128,-94]mm · 3 of 86 slices shown]
[im 9/86  bone]
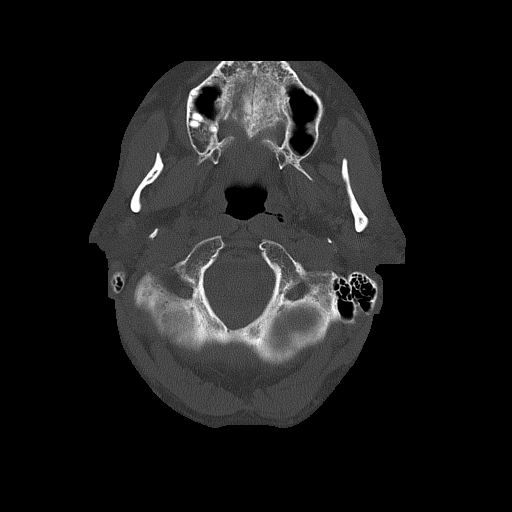
[im 18/86  bone]
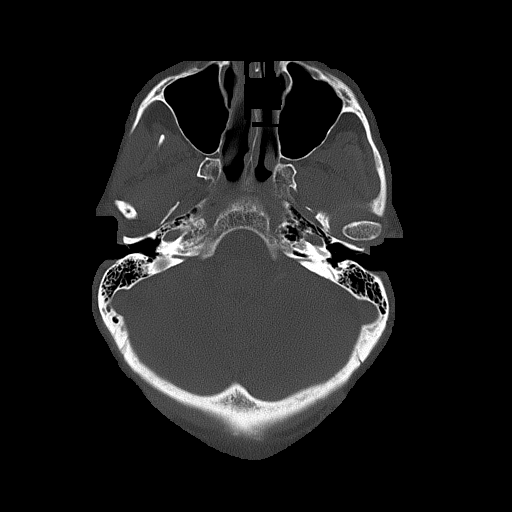
[im 26/86  bone]
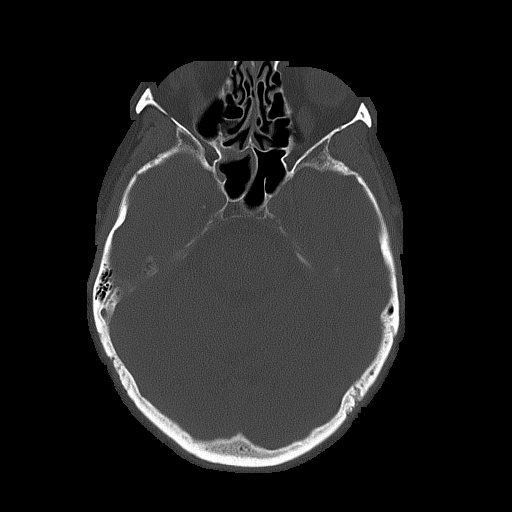

[Series 5: head without cor · coronal · non-contrast · 0.33mm/px · 3 of 69 slices shown]
[im 25/69  brain]
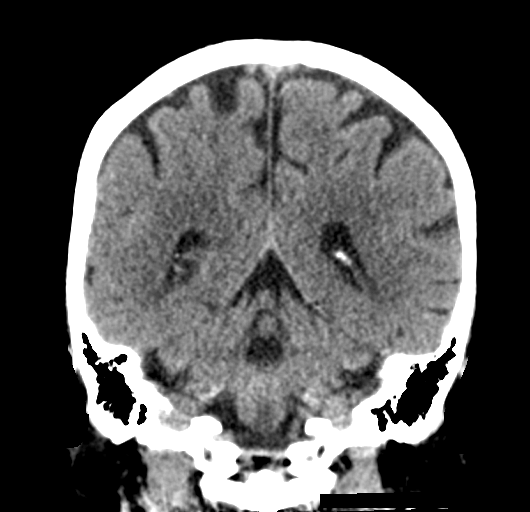
[im 31/69  brain]
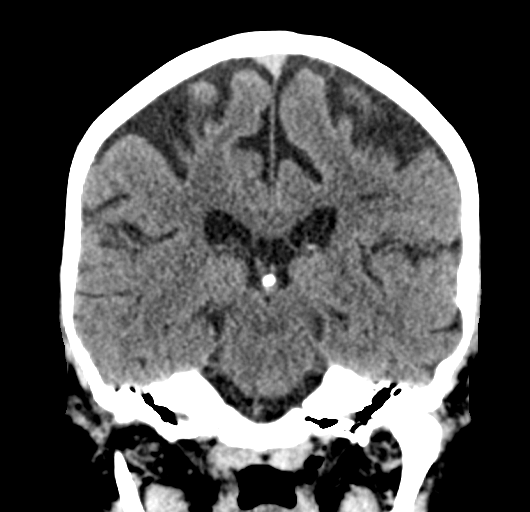
[im 38/69  brain]
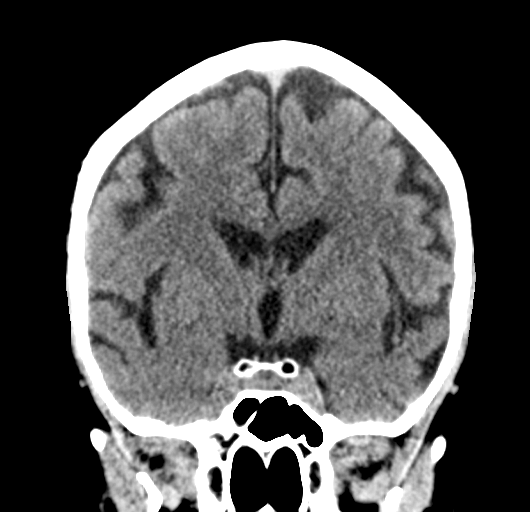

[Series 6: head without sag · sagittal · non-contrast · 0.33mm/px · 3 of 67 slices shown]
[im 23/67  brain]
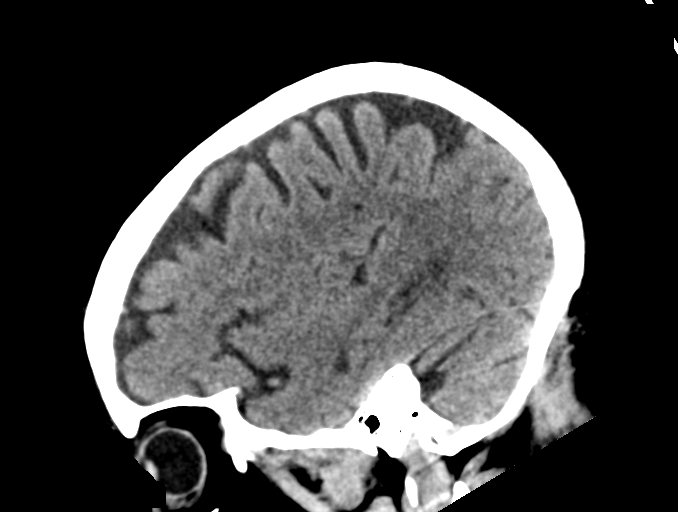
[im 34/67  brain]
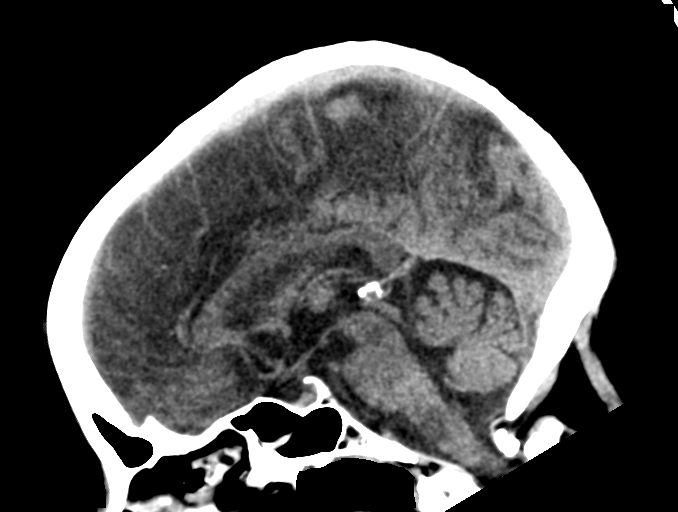
[im 45/67  brain]
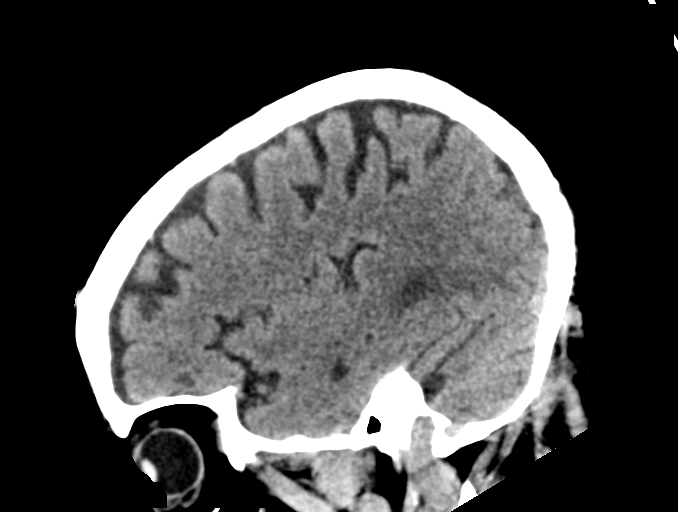

[16 of 47 positions shown; findings below may reference images not displayed]

FINDINGS: CT HEAD FINDINGS

Brain: There is no mass, hemorrhage or extra-axial collection. The
size and configuration of the ventricles and extra-axial CSF spaces
are normal. The brain parenchyma is normal, without evidence of
acute or chronic infarction.

Vascular: No abnormal hyperdensity of the major intracranial
arteries or dural venous sinuses. No intracranial atherosclerosis.

Skull: The visualized skull base, calvarium and extracranial soft
tissues are normal.

CT MAXILLOFACIAL FINDINGS

Osseous: There is a minimally displaced fracture of the right nasal
bone.

Orbits: The globes are intact. Normal appearance of the intra- and
extraconal fat. Symmetric extraocular muscles and optic nerves.

Sinuses: No fluid levels or advanced mucosal thickening.

Soft tissues: Normal visualized extracranial soft tissues.

CT CERVICAL SPINE FINDINGS

Alignment: No static subluxation. Facets are aligned. Occipital
condyles and the lateral masses of C1-C2 are aligned. Reversal of
normal cervical lordosis may be positional or due to muscle spasm.

Skull base and vertebrae: No acute fracture.

Soft tissues and spinal canal: No prevertebral fluid or swelling. No
visible canal hematoma.

Disc levels: No advanced spinal canal or neural foraminal stenosis.

Upper chest: No pneumothorax, pulmonary nodule or pleural effusion.

Other: Normal visualized paraspinal cervical soft tissues.
IMPRESSION: 1. No acute intracranial abnormality.
2. Minimally displaced fracture of the right nasal bone.
3. No acute fracture or static subluxation of the cervical spine.

## 2021-08-27 IMAGING — CT CT CERVICAL SPINE W/O CM
3 of 4 series · 10 of 33 positions shown, 12 images · non-contrast
Comparison: None.

CLINICAL DATA: Fall

EXAM:
CT HEAD WITHOUT CONTRAST
CT MAXILLOFACIAL WITHOUT CONTRAST
CT CERVICAL SPINE WITHOUT CONTRAST
TECHNIQUE: Multidetector CT imaging of the head, cervical spine, and
maxillofacial structures were performed using the standard protocol
without intravenous contrast. Multiplanar CT image reconstructions
of the cervical spine and maxillofacial structures were also
generated.

[Series 4: c_spine 2.0 st · axial · 0.30mm/px · z∈[-210,-150]mm · 2 of 91 slices shown, 3 images]
[im 31/91  soft-tissue]
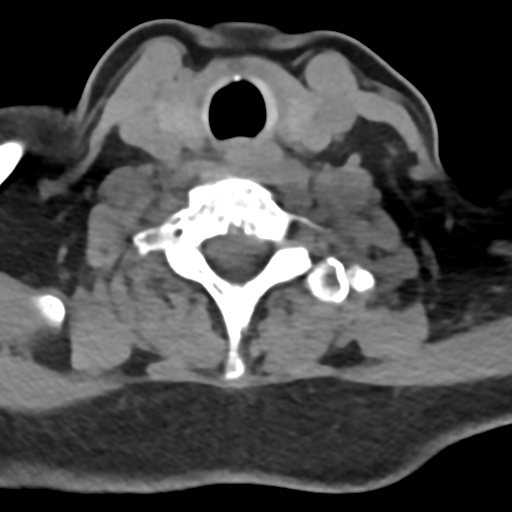
[im 31/91  bone]
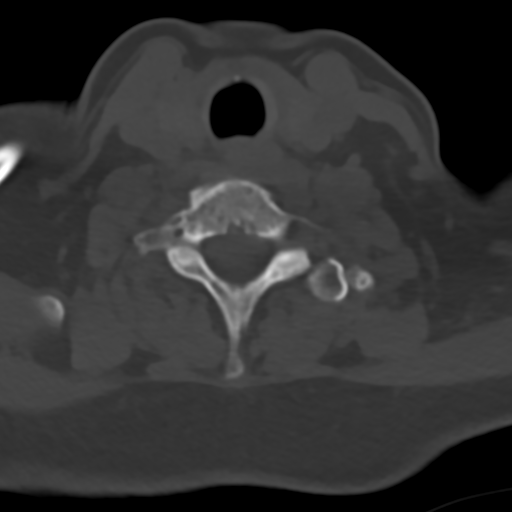
[im 61/91  bone]
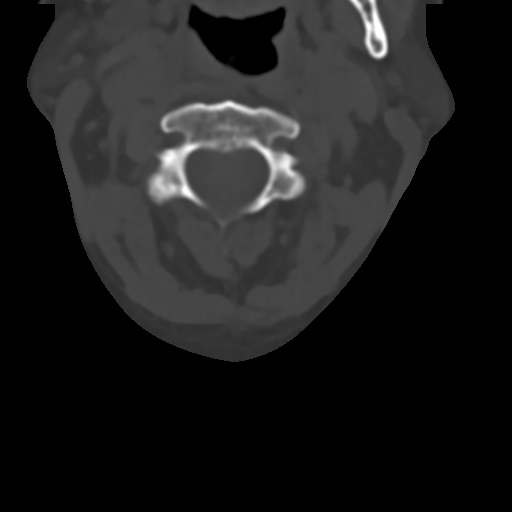

[Series 6: c_spine 2.0 sag bone · sagittal · 0.27mm/px · 5 of 61 slices shown, 6 images]
[im 21/61  bone]
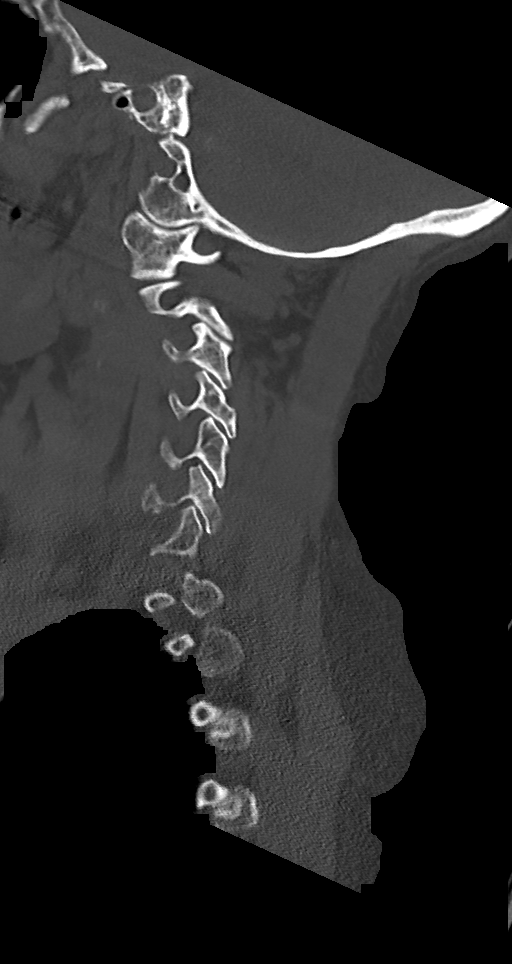
[im 26/61  bone]
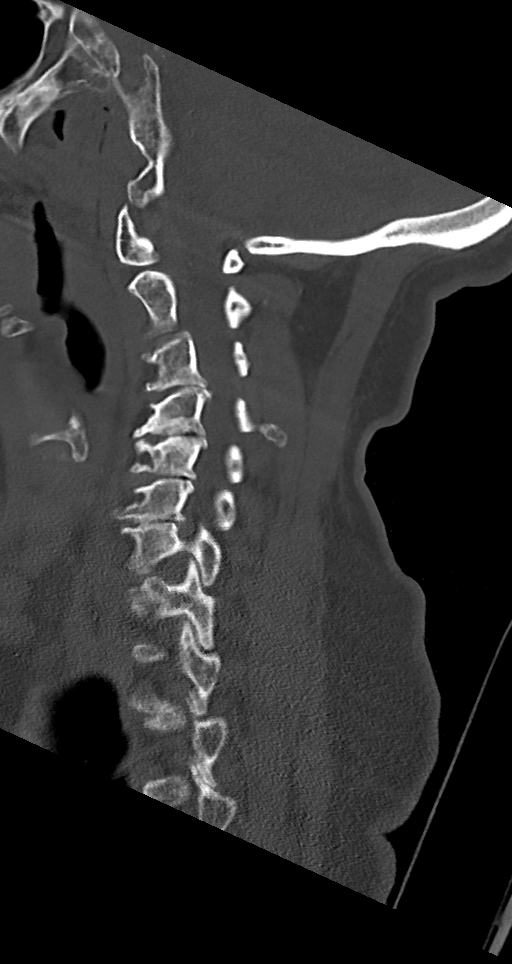
[im 31/61  soft-tissue]
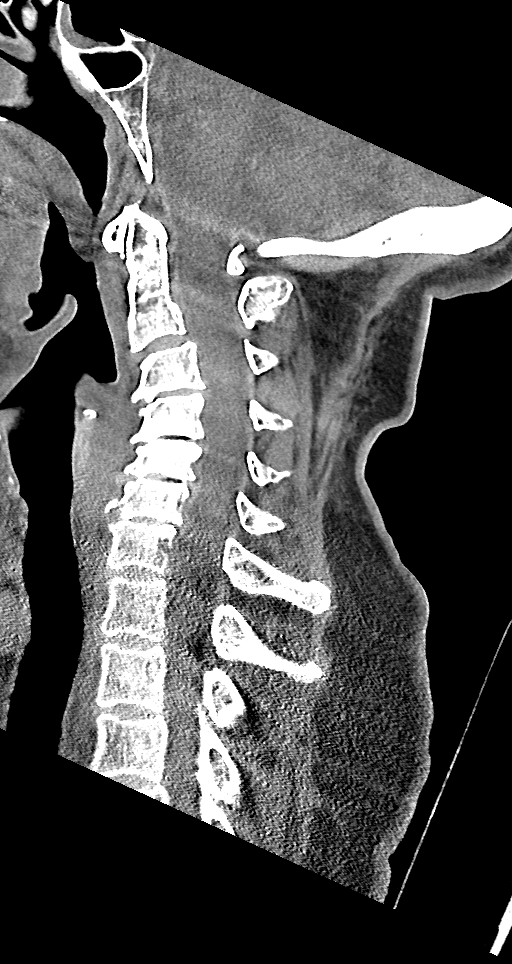
[im 31/61  bone]
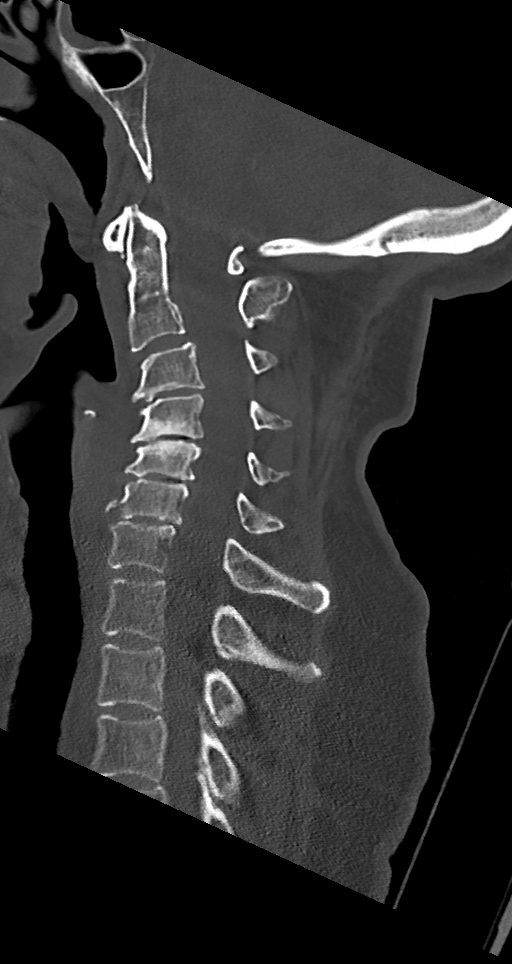
[im 36/61  bone]
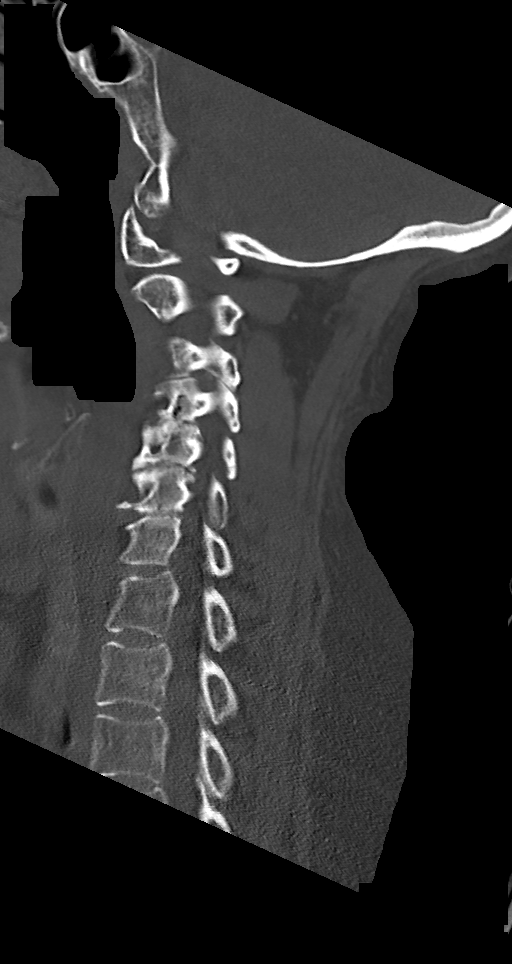
[im 41/61  bone]
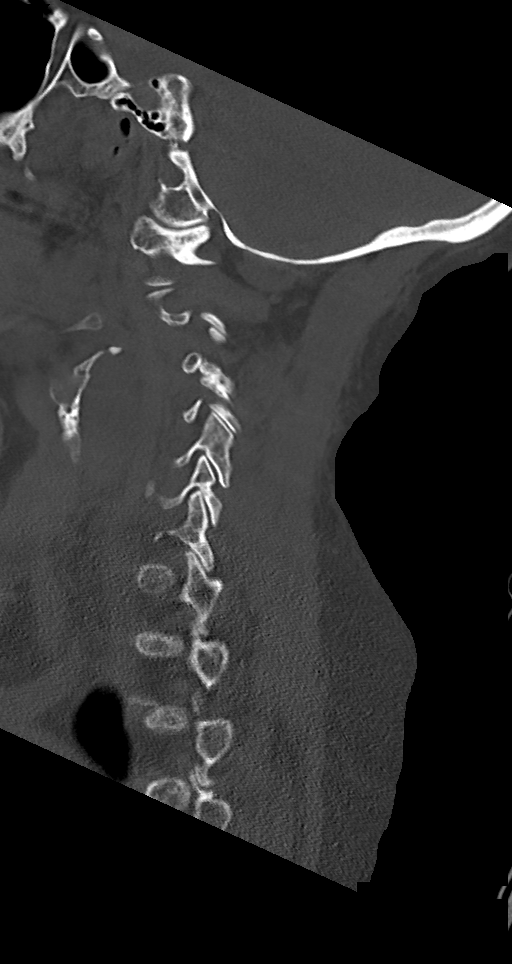

[Series 7: c_spine 2.0 cor bone · coronal · 0.27mm/px · 3 of 61 slices shown]
[im 13/61  bone]
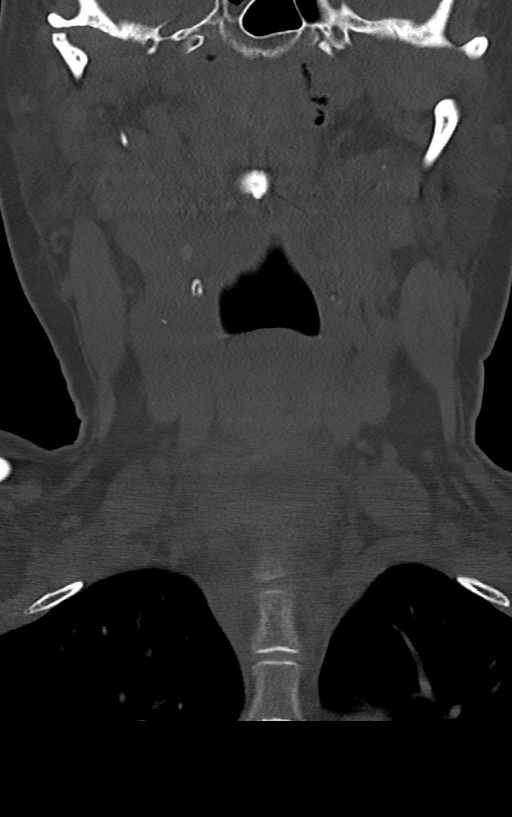
[im 25/61  bone]
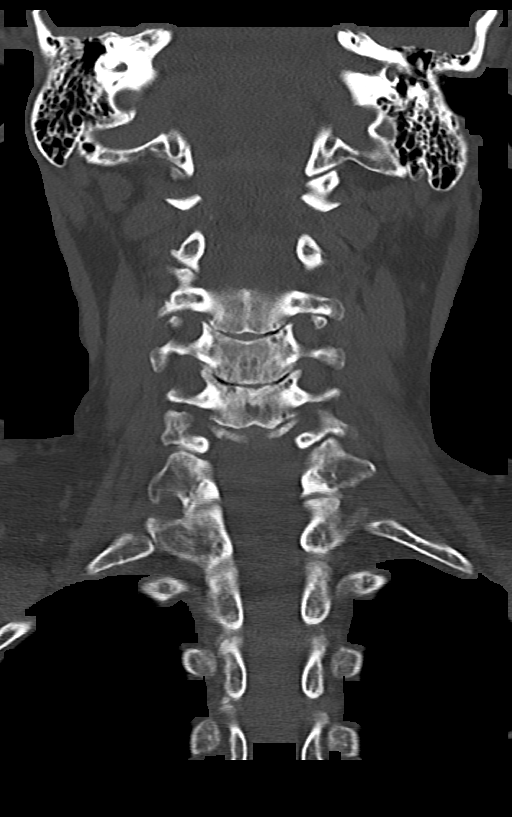
[im 37/61  bone]
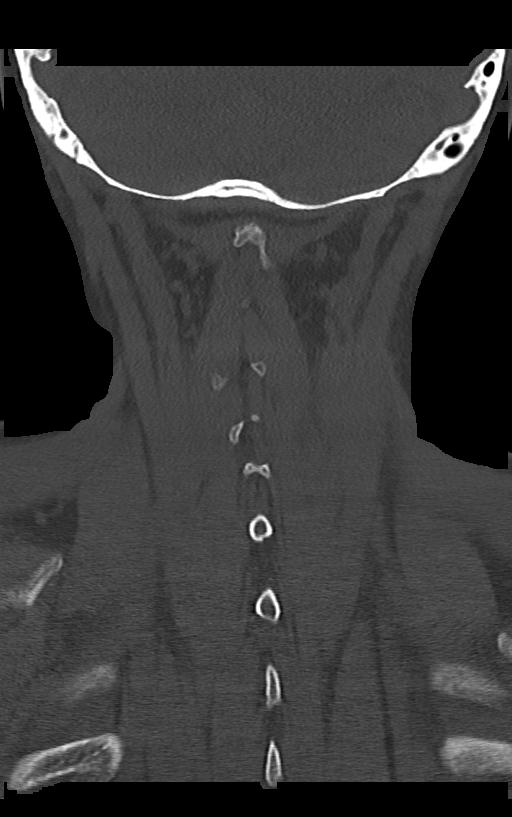

[10 of 33 positions shown; findings below may reference images not displayed]

FINDINGS: CT HEAD FINDINGS

Brain: There is no mass, hemorrhage or extra-axial collection. The
size and configuration of the ventricles and extra-axial CSF spaces
are normal. The brain parenchyma is normal, without evidence of
acute or chronic infarction.

Vascular: No abnormal hyperdensity of the major intracranial
arteries or dural venous sinuses. No intracranial atherosclerosis.

Skull: The visualized skull base, calvarium and extracranial soft
tissues are normal.

CT MAXILLOFACIAL FINDINGS

Osseous: There is a minimally displaced fracture of the right nasal
bone.

Orbits: The globes are intact. Normal appearance of the intra- and
extraconal fat. Symmetric extraocular muscles and optic nerves.

Sinuses: No fluid levels or advanced mucosal thickening.

Soft tissues: Normal visualized extracranial soft tissues.

CT CERVICAL SPINE FINDINGS

Alignment: No static subluxation. Facets are aligned. Occipital
condyles and the lateral masses of C1-C2 are aligned. Reversal of
normal cervical lordosis may be positional or due to muscle spasm.

Skull base and vertebrae: No acute fracture.

Soft tissues and spinal canal: No prevertebral fluid or swelling. No
visible canal hematoma.

Disc levels: No advanced spinal canal or neural foraminal stenosis.

Upper chest: No pneumothorax, pulmonary nodule or pleural effusion.

Other: Normal visualized paraspinal cervical soft tissues.
IMPRESSION: 1. No acute intracranial abnormality.
2. Minimally displaced fracture of the right nasal bone.
3. No acute fracture or static subluxation of the cervical spine.

## 2021-08-27 IMAGING — DX DG HAND COMPLETE 3+V*R*
3 series · 3 of 3 positions shown · non-contrast
Comparison: None.

CLINICAL DATA: Right hand pain status post fall breakage

EXAM:
RIGHT HAND - COMPLETE 3+ VIEW

[hand pa]
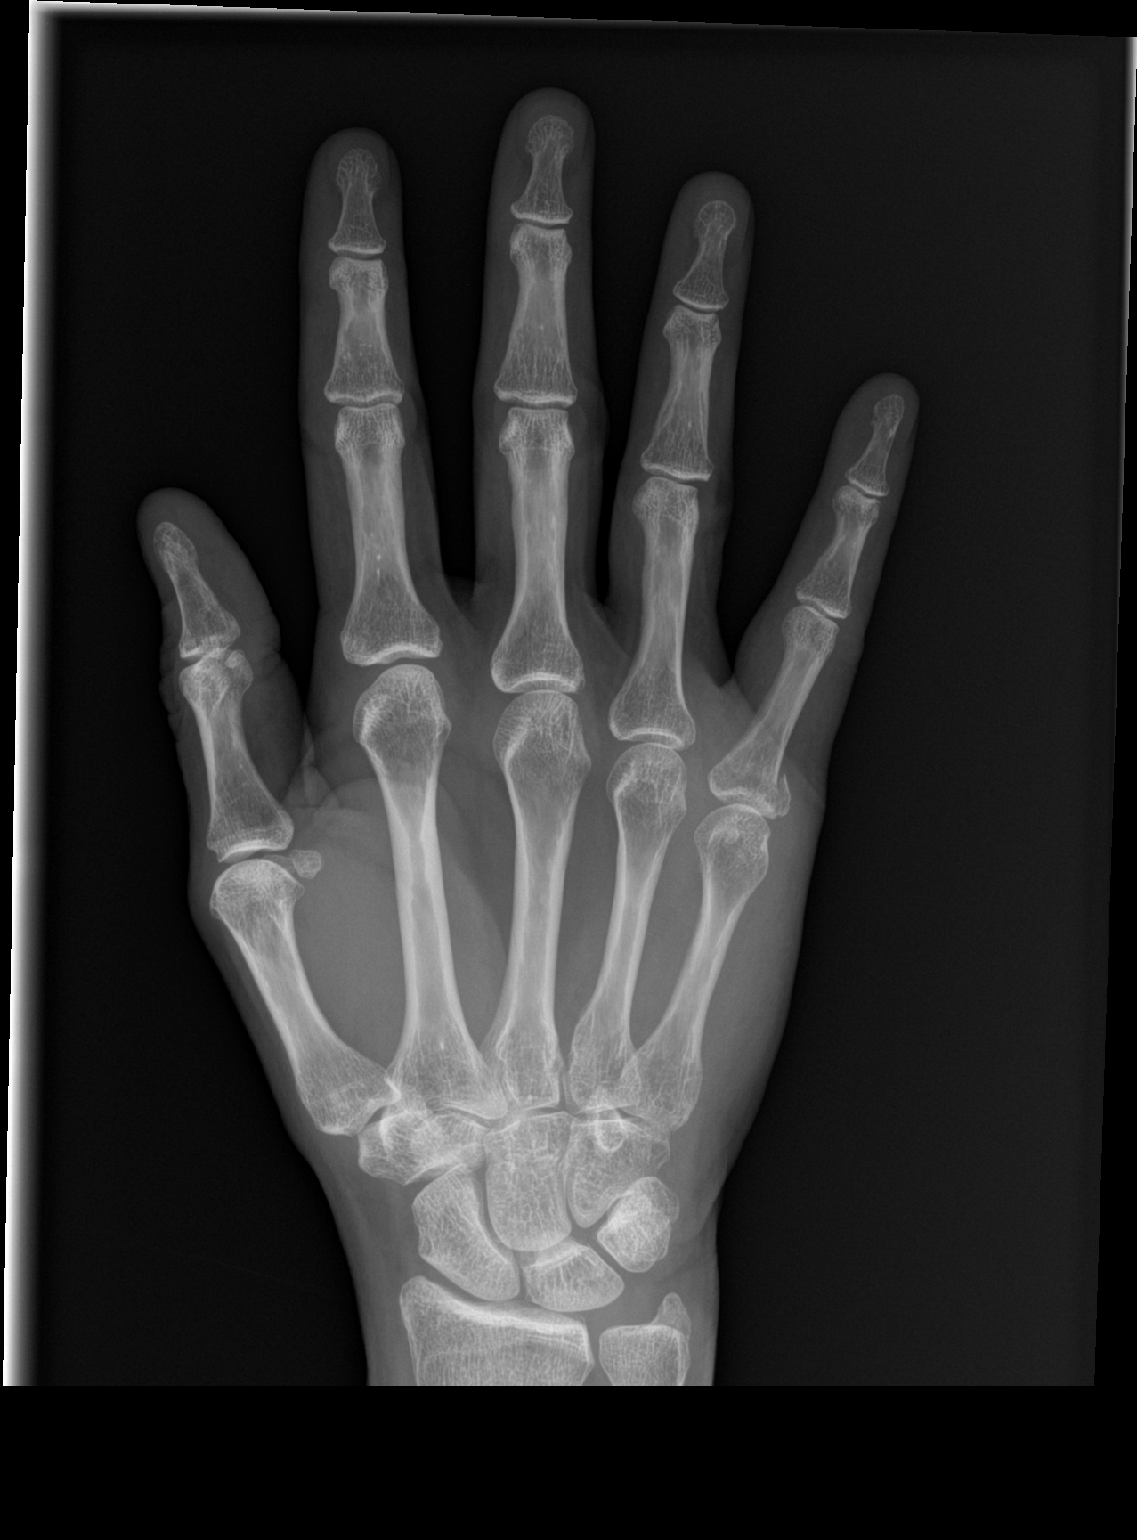

[hand obl]
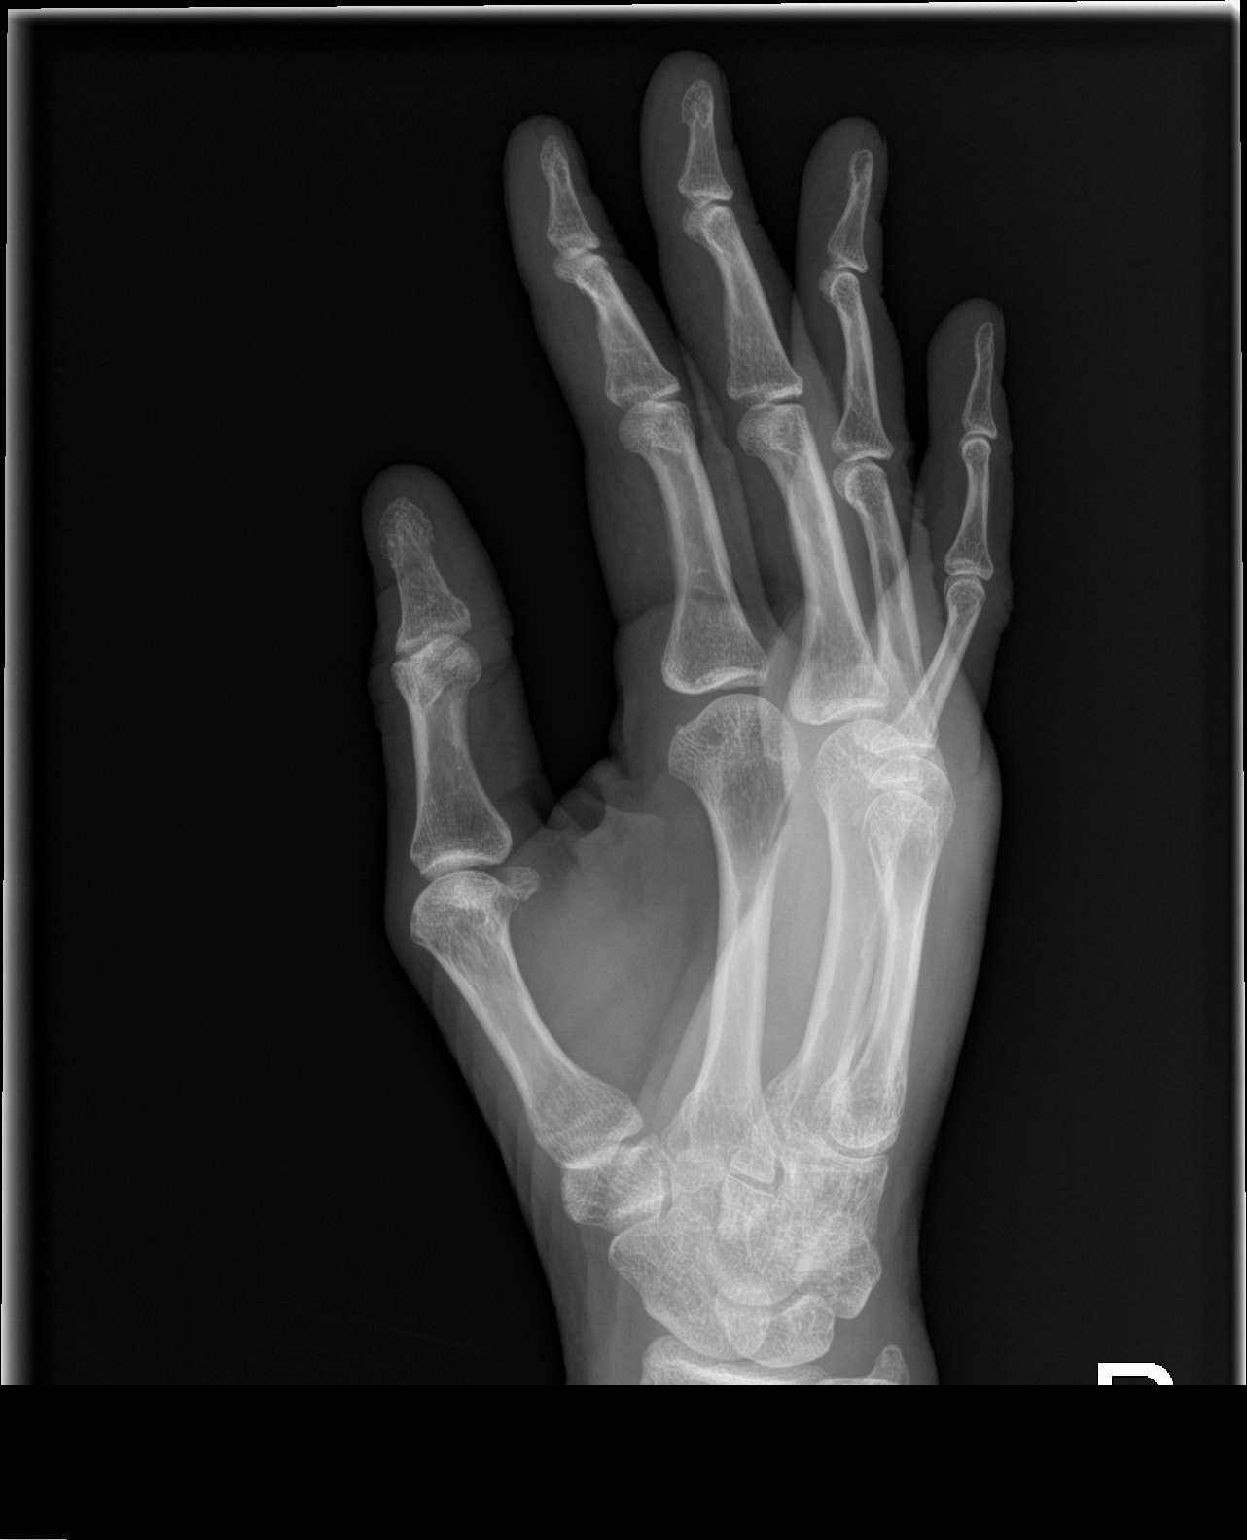

[hand lat]
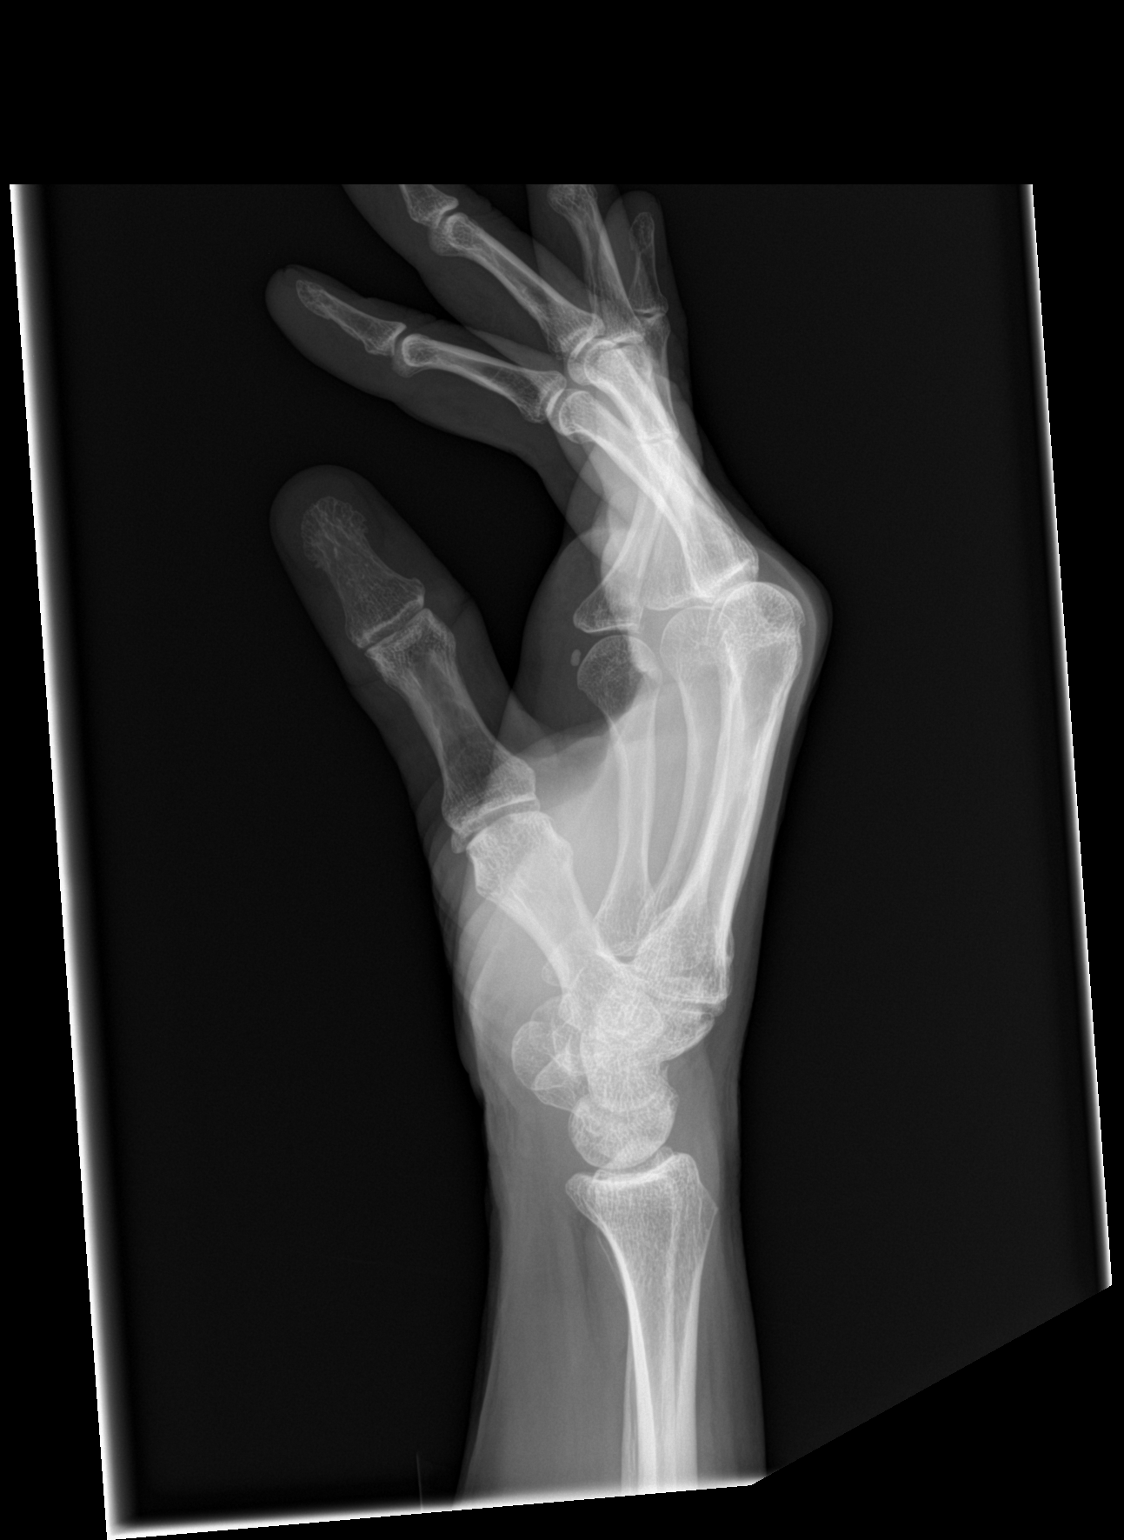

[3 of 3 positions shown; findings below may reference images not displayed]

FINDINGS: Mildly displaced fracture of the lateral aspect of the base of the
proximal phalanx of the little/fifth finger, which involves the
articular surface and is seen best on the PA radiograph. There is no
other fracture or dislocation. The joint spaces are preserved. Soft
tissues are unremarkable.
IMPRESSION: Mildly displaced fracture through the lateral aspect of the base of
the proximal phalanx of the little finger, involving the articular
surface.

## 2021-08-27 MED ORDER — ONDANSETRON HCL 4 MG PO TABS: 4.0000 mg | ORAL_TABLET | Freq: Four times a day (QID) | ORAL | 0 refills | Status: AC | PRN

## 2021-08-27 MED ORDER — AMOXICILLIN-POT CLAVULANATE 875-125 MG PO TABS: 1.0000 | ORAL_TABLET | Freq: Two times a day (BID) | ORAL | 0 refills | Status: AC

## 2021-08-27 NOTE — Progress Notes (Signed)
PROGRESS NOTE Deanna Fischer  BVQ:[id number] DOB: 01-06-1961 DOA: 08/16/2021 PCP: Patient, No Pcp Per (Inactive)   Brief Narrative/Hospital Course: 29yof w. Hx of lupus and hypothyroidism who presents with several days of abdominal pain and N/V/D , inflammatory process in lower abdomen on CT which could reflect enteritis or appendicitis per radiology, also have pelvic abscess status post drain, IR/general surgery following .WBC continue to trend up on broad-spectrum antibiotic and antifungal on board.  WBC now finally improving BMP stable.  Being treated for pelvic abscess, hyponatremia hypokalemia hypophosphatemia hypomagnesemia lupus and hypothyroidism.  Abscess culture from 2/15 showed abundant E. coli sensitive to cefepime ceftriaxone quinolone. surgery,IR on board-s/p transgluteal drain 2/15, w/ 15 mL purulent fluid aspirated, drain study  2/20 completed Drain culture with E. Coli, on IV antibiotics with Zosyn since 2/13 and antifungal Eraxis 2/17. CT scan 2/18 negative for abscess.Patient's drain is out.Will need colonoscopy outpatient once inflammation resolves. At this time patient is clinically improved leukocytosis almost resolved afebrile tolerating diet.      Subjective: Seen and examined this morning.  Patient feels generalized pain achiness low appetite did not eat dinner last night, does not feel well enough to go home today as she lives by herself. Reports no bowel movement. Assessment and Plan: * Pelvic abscess in female Pelvic abscess POA Chronic peritonitis-infectious versus inflammatory enteritis versus appendicitis, possible diverticulitis versus Crohn's: surgery,IR on board-s/p transgluteal drain 2/15, w/ 15 mL purulent fluid aspirated, drain study  2/20 completed Drain culture with E. Coli, on IV antibiotics with Zosyn since 2/13 and antifungal Eraxis 2/17. CT scan 2/18 negative for abscess. Patient's drain is out.Will need colonoscopy outpatient once inflammation  resolves. Discussed with surgery recommending Augmentin for 4 more days continue diet pain control supportive care and okay for discharge.  Chronic peritonitis ([place]) See above  Systemic lupus erythematosus ([place])- (present on admission) Patient having generalized pain and discomfort feels reluctant to go home.  Add ibuprofen likely from patient's SLE was not getting Plaquenil and postop course.  Continue her hydroxychloroquine, followed by [facility] rheumatology.  Electrolyte imbalance Repleted.  Monitor Recent Labs  Lab 08/22/21 0547 08/23/21 0540 08/24/21 0606 08/25/21 0606 08/27/21 0601  K 3.4* 2.8* 3.6 3.4* 4.2  CALCIUM 8.4* 7.9* 8.3* 8.1* 8.4*  MG 1.9 1.7 2.1 1.8 2.1  PHOS  --  2.9  --   --   --     Depression with anxiety- (present on admission) Mood stable, on home meds  Normocytic anemia- (present on admission) Holding in 9 to 10 g range.  Monitor Recent Labs  Lab 08/23/21 0540 08/24/21 0606 08/25/21 0606 08/26/21 0607 08/27/21 0601  HGB 10.3* 11.0* 9.9* 9.9* 10.0*  HCT 29.2* 32.6* 29.3* 29.2* 30.3*    Hypothyroidism- (present on admission) Continue Synthroid  DVT prophylaxis: enoxaparin (LOVENOX) injection 40 mg Start: 08/24/21 1200 Place and maintain sequential compression device Start: 08/19/21 0801 SCDs Start: 08/17/21 1328 Code Status:   Code Status: Full Code Family Communication: plan of care discussed with patient at bedside.  Disposition: Currently not medically stable for discharge. Status is: Inpatient Remains inpatient appropriate because: Ongoing management of patient's generalized weakness pain in the setting of postop status. Anticipate discharge tomorrow  Objective: Vitals last 24 hrs: Vitals:   08/26/21 0454 08/26/21 1317 08/26/21 2013 08/27/21 0528  BP: 115/83 126/83 112/77 114/74  Pulse: 79 73 85 77  Resp: 14 15 18 16   Temp: 98.3 F (36.8 C) 98.6 F (37 C) 99.5 F (37.5 C) 98.2 F (36.8 C)  TempSrc: Oral Oral Oral Oral   SpO2: 91% 99% 96% 92%  Weight:      Height:       Weight change:   Physical Examination:  General exam: Aa0x3, pleasant,older than stated age, weak appearing. HEENT:Oral mucosa moist, Ear/Nose WNL grossly, dentition normal. Respiratory system: bilaterally clear , no use of accessory muscle Cardiovascular system: S1 & S2 +, No JVD,. Gastrointestinal system: Abdomen soft,NT,ND, BS+ Nervous System:Alert, awake, moving extremities and grossly nonfocal Extremities: edema neg,distal peripheral pulses palpable.  Skin: No rashes,no icterus. MSK: Mildly tender muscle and joint groups normal tone, power   Medications reviewed:  Scheduled Meds:  acetaminophen  1,000 mg Oral Q6H   buPROPion  300 mg Oral Daily   docusate sodium  100 mg Oral Daily   enoxaparin (LOVENOX) injection  40 mg Subcutaneous Q24H   escitalopram  10 mg Oral Daily   feeding supplement  237 mL Oral BID BM   guaiFENesin  1,200 mg Oral BID   hydroxychloroquine  200 mg Oral Daily   levothyroxine  100 mcg Oral Q0600   methocarbamol  500 mg Oral TID   pantoprazole  40 mg Oral Q1200   polyethylene glycol  17 g Oral Daily   protein supplement  2 oz Oral BID   senna-docusate  1 tablet Oral QHS   sodium chloride flush  10 mL Intracatheter Q12H   Continuous Infusions:  sodium chloride     anidulafungin 100 mg (08/27/21 1012)   piperacillin-tazobactam (ZOSYN)  IV 3.375 g (08/27/21 0542)   promethazine (PHENERGAN) injection (IM or IVPB)        Diet Order             DIET SOFT Room service appropriate? Yes; Fluid consistency: Thin  Diet effective now                            Intake/Output Summary (Last 24 hours) at 08/27/2021 1033 Last data filed at 08/26/2021 2134 Gross per 24 hour  Intake 10 ml  Output --  Net 10 ml    Net IO Since Admission: 8,820.74 mL [08/27/21 1033]  Wt Readings from Last 3 Encounters:  08/16/21 56.7 kg  02/15/21 56.7 kg     Unresulted Labs (From admission, onward)      Start     Ordered   08/23/21 0500  CBC  Every Mon-Wed-Fri (0500),   R      08/20/21 0807          Data Reviewed: I have personally reviewed following labs and imaging studies CBC: Recent Labs  Lab 08/21/21 0826 08/22/21 0547 08/23/21 0540 08/24/21 0606 08/25/21 0606 08/26/21 0607 08/27/21 0601  WBC 15.7*   < > 15.2* 14.9* 15.4* 14.1* 11.4*  NEUTROABS 12.2*  --   --   --   --   --   --   HGB 11.1*   < > 10.3* 11.0* 9.9* 9.9* 10.0*  HCT 32.0*   < > 29.2* 32.6* 29.3* 29.2* 30.3*  MCV 97.0   < > 94.5 95.9 98.0 100.0 99.0  PLT 427*   < > 468* 178 486* 512* 568*   < > = values in this interval not displayed.    Basic Metabolic Panel: Recent Labs  Lab 08/22/21 0547 08/23/21 0540 08/24/21 0606 08/25/21 0606 08/27/21 0601  NA 135 132* 133* 131* 136  K 3.4* 2.8* 3.6 3.4* 4.2  CL 96* 94* 96* 97* 101  CO2 29 29 30 29 27   GLUCOSE 83 93 108* 103* 85  BUN <5* <5* <5* 6 6  CREATININE 0.50 0.37* 0.47 0.37* 0.43*  CALCIUM 8.4* 7.9* 8.3* 8.1* 8.4*  MG 1.9 1.7 2.1 1.8 2.1  PHOS  --  2.9  --   --   --     GFR: Estimated Creatinine Clearance: 68.6 mL/min (A) (by C-G formula based on SCr of 0.43 mg/dL (L)). Liver Function Tests: No results for input(s): AST, ALT, ALKPHOS, BILITOT, PROT, ALBUMIN in the last 168 hours.  No results for input(s): LIPASE, AMYLASE in the last 168 hours. No results for input(s): AMMONIA in the last 168 hours. Coagulation Profile: No results for input(s): INR, PROTIME in the last 168 hours.  Cardiac Enzymes: No results for input(s): CKTOTAL, CKMB, CKMBINDEX, TROPONINI in the last 168 hours. BNP (last 3 results) No results for input(s): PROBNP in the last 8760 hours. HbA1C: No results for input(s): HGBA1C in the last 72 hours. CBG: Recent Labs  Lab 08/26/21 0455 08/26/21 1145 08/26/21 1736 08/26/21 2334 08/27/21 0539  GLUCAP 90 103* 92 100* 83    Lipid Profile: No results for input(s): CHOL, HDL, LDLCALC, TRIG, CHOLHDL, LDLDIRECT in the  last 72 hours. Thyroid Function Tests: No results for input(s): TSH, T4TOTAL, FREET4, T3FREE, THYROIDAB in the last 72 hours. Anemia Panel: No results for input(s): VITAMINB12, FOLATE, FERRITIN, TIBC, IRON, RETICCTPCT in the last 72 hours. Sepsis Labs: Recent Labs  Lab 08/22/21 0547  LATICACIDVEN 1.3     Recent Results (from the past 240 hour(s))  Aerobic/Anaerobic Culture w Gram Stain (surgical/deep wound)     Status: None   Collection Time: 08/18/21  4:21 PM   Specimen: Abscess  Result Value Ref Range Status   Specimen Description   Final    ABSCESS Performed at [facility] [address], [place], [place] [address]    Special Requests   Final    PELVIC Performed at [facility], [place] [address], [place], [place] [address]    Gram Stain   Final    ABUNDANT WBC PRESENT, PREDOMINANTLY PMN ABUNDANT GRAM NEGATIVE RODS FEW GRAM POSITIVE COCCI IN PAIRS IN CLUSTERS FEW GRAM POSITIVE RODS    Culture   Final    ABUNDANT ESCHERICHIA COLI ABUNDANT BACTEROIDES THETAIOTAOMICRON BETA LACTAMASE POSITIVE Performed at [facility], [place] [address], [place], [place] [address]    Report Status 08/25/2021 FINAL  Final   Organism ID, Bacteria ESCHERICHIA COLI  Final      Susceptibility   Escherichia coli - MIC*    AMPICILLIN >=32 RESISTANT Resistant     CEFAZOLIN <=4 SENSITIVE Sensitive     CEFEPIME <=0.12 SENSITIVE Sensitive     CEFTAZIDIME <=1 SENSITIVE Sensitive     CEFTRIAXONE <=0.25 SENSITIVE Sensitive     CIPROFLOXACIN <=0.25 SENSITIVE Sensitive     GENTAMICIN <=1 SENSITIVE Sensitive     IMIPENEM <=0.25 SENSITIVE Sensitive     TRIMETH/SULFA >=320 RESISTANT Resistant     AMPICILLIN/SULBACTAM 8 SENSITIVE Sensitive     PIP/TAZO <=4 SENSITIVE Sensitive     * ABUNDANT ESCHERICHIA COLI     Antimicrobials: Anti-infectives (From admission, onward)    Start     Dose/Rate Route Frequency Ordered Stop   08/27/21 0000   amoxicillin-clavulanate (AUGMENTIN) 875-125 MG tablet        1 tablet Oral 2 times daily 08/27/21 1006 08/31/21 2359   08/26/21 1000  hydroxychloroquine (PLAQUENIL) tablet 200 mg  200 mg Oral Daily 08/26/21 0913     08/21/21 1000  anidulafungin (ERAXIS) 100 mg in sodium chloride 0.9 % 100 mL IVPB        100 mg 78 mL/hr over 100 Minutes Intravenous Every 24 hours 08/20/21 0724     08/20/21 0900  anidulafungin (ERAXIS) 200 mg in sodium chloride 0.9 % 200 mL IVPB        200 mg 78 mL/hr over 200 Minutes Intravenous  Once 08/20/21 0722 08/20/21 1347   08/17/21 1730  hydroxychloroquine (PLAQUENIL) tablet 200 mg  Status:  Discontinued        200 mg Oral Daily 08/17/21 1630 08/20/21 1225   08/16/21 1815  piperacillin-tazobactam (ZOSYN) IVPB 3.375 g        3.375 g 12.5 mL/hr over 240 Minutes Intravenous Every 8 hours 08/16/21 1805        Culture/Microbiology    Component Value Date/Time   SDES  08/18/2021 1621    ABSCESS Performed at [facility], [place] [address], [place], [place] [address]    SPECREQUEST  08/18/2021 1621    PELVIC Performed at [facility], [place] [address], [place], [place] [address]    CULT  08/18/2021 1621    ABUNDANT ESCHERICHIA COLI ABUNDANT BACTEROIDES THETAIOTAOMICRON BETA LACTAMASE POSITIVE Performed at [facility], [place] [address], [place], [place] [address]    REPTSTATUS 08/25/2021 FINAL 08/18/2021 1621    Other culture-see note  Radiology Studies: No results found.   LOS: 10 days   Antonieta Pert, MD [facility]  08/27/2021, 10:33 AM

## 2021-08-27 NOTE — Progress Notes (Signed)
Chief Complaint/Subjective: Doing really well from an abdominal standpoint. Does feel like she is getting constipated and is having some new soreness she gets with lupus flares.  Objective: Vital signs in last 24 hours: Temp:  [98.2 F (36.8 C)-99.5 F (37.5 C)] 98.2 F (36.8 C) (02/24 0528) Pulse Rate:  [73-85] 77 (02/24 0528) Resp:  [15-18] 16 (02/24 0528) BP: (112-126)/(74-83) 114/74 (02/24 0528) SpO2:  [92 %-99 %] 92 % (02/24 0528) Last BM Date : 08/26/21 Intake/Output from previous day: 02/23 0701 - 02/24 0700 In: 10 [I.V.:10] Out: -  Intake/Output this shift: No intake/output data recorded.  PE: Gen: NAD Resp: nonlabored on room air Card: RRR Abd: soft, NT, ND MSK: no edema, calves soft and NT bilaterally  Lab Results:  Recent Labs    08/26/21 0607 08/27/21 0601  WBC 14.1* 11.4*  HGB 9.9* 10.0*  HCT 29.2* 30.3*  PLT 512* 568*    BMET Recent Labs    08/25/21 0606 08/27/21 0601  NA 131* 136  K 3.4* 4.2  CL 97* 101  CO2 29 27  GLUCOSE 103* 85  BUN 6 6  CREATININE 0.37* 0.43*  CALCIUM 8.1* 8.4*    PT/INR No results for input(s): LABPROT, INR in the last 72 hours. CMP     Component Value Date/Time   NA 136 08/27/2021 0601   K 4.2 08/27/2021 0601   CL 101 08/27/2021 0601   CO2 27 08/27/2021 0601   GLUCOSE 85 08/27/2021 0601   BUN 6 08/27/2021 0601   CREATININE 0.43 (L) 08/27/2021 0601   CALCIUM 8.4 (L) 08/27/2021 0601   PROT 6.9 08/16/2021 1554   ALBUMIN 2.6 (L) 08/18/2021 0622   AST 12 (L) 08/16/2021 1554   ALT 7 08/16/2021 1554   ALKPHOS 44 08/16/2021 1554   BILITOT 0.5 08/16/2021 1554   GFRNONAA >60 08/27/2021 0601   Lipase     Component Value Date/Time   LIPASE 28 08/16/2021 1554    Studies/Results: No results found.  Anti-infectives: Anti-infectives (From admission, onward)    Start     Dose/Rate Route Frequency Ordered Stop   08/26/21 1000  hydroxychloroquine (PLAQUENIL) tablet 200 mg        200 mg Oral Daily  08/26/21 0913     08/21/21 1000  anidulafungin (ERAXIS) 100 mg in sodium chloride 0.9 % 100 mL IVPB        100 mg 78 mL/hr over 100 Minutes Intravenous Every 24 hours 08/20/21 0724     08/20/21 0900  anidulafungin (ERAXIS) 200 mg in sodium chloride 0.9 % 200 mL IVPB        200 mg 78 mL/hr over 200 Minutes Intravenous  Once 08/20/21 0722 08/20/21 1347   08/17/21 1730  hydroxychloroquine (PLAQUENIL) tablet 200 mg  Status:  Discontinued        200 mg Oral Daily 08/17/21 1630 08/20/21 1225   08/16/21 1815  piperacillin-tazobactam (ZOSYN) IVPB 3.375 g        3.375 g 12.5 mL/hr over 240 Minutes Intravenous Every 8 hours 08/16/21 1805         Assessment/Plan Pelvic abscess Chronic peritonitis - Infectious vs inflammatory enteritis vs appendicitis, possible diverticulitis vs crohn's Leukocytosis  - CT 2/13 showing: "1. Constellation of findings, most consistent with an inflammatory lower abdominal and pelvic process, including developing cul-de-sac abscess. Question if this represents infectious or inflammatory enteritis versus appendicitis. 2. Mild small bowel dilatation, suggesting secondary adynamic ileus." - WBC 11.4 (14.1), afebrile - IR  transgluteal drain 2/15 - 15 ml purulent fluid aspirated with placement. Drain study 2/20 without fistula. Drain removed 2/22 - continue IV abx, drain culture - e coli, with several sensitivities  - eraxis added 2/17 - cough - present since PTA. Lasix 2/19 -CT scan 2/18 negative for abscess  - No urgent/emergent surgery recommended at this time. Continue to plan for colonoscopy outpatient once inflammation resolved   Stable for discharge from surgical standpoint on 4 days augmentin. She will need to follow up with GI - I have spoken with [facility] and placed their information in her discharge paperwork. Follow up with Dr. Johney Maine scheduled  FEN: soft ID: zosyn 2/13 >>, eraxis 2/17>> VTE: lovenox   Lupus - on plaquenil - reordered  today hypothyroidism     LOS: 10 days   I reviewed hospitalist notes, last 24 h vitals and pain scores, last 48 h intake and output, and last 24 h labs and trends.  This care required moderate level of medical decision making.   Winferd Humphrey, [facility] 08/27/2021, 8:38 AM Please see Amion for pager number during day hours 7:00am-4:30pm

## 2021-08-28 ENCOUNTER — Inpatient Hospital Stay (HOSPITAL_COMMUNITY): Payer: Medicare Other

## 2021-08-28 LAB — GLUCOSE, CAPILLARY
Glucose-Capillary: 119 mg/dL — ABNORMAL HIGH (ref 70–99)
Glucose-Capillary: 92 mg/dL (ref 70–99)
Glucose-Capillary: 95 mg/dL (ref 70–99)

## 2021-08-28 MED ORDER — SENNA 8.6 MG PO TABS: 1.0000 | ORAL_TABLET | Freq: Every day | ORAL | 0 refills | Status: DC | PRN

## 2021-08-28 MED ORDER — PANTOPRAZOLE SODIUM 40 MG PO TBEC: 40.0000 mg | DELAYED_RELEASE_TABLET | Freq: Every day | ORAL | 0 refills | Status: DC

## 2021-08-28 MED ORDER — SENNA 8.6 MG PO TABS
1.0000 | ORAL_TABLET | Freq: Every day | ORAL | Status: DC | PRN
  Administered 2021-08-28: 8.6 mg via ORAL

## 2021-08-28 MED ORDER — IOHEXOL 300 MG/ML  SOLN
100.0000 mL | Freq: Once | INTRAMUSCULAR | Status: AC | PRN
  Administered 2021-08-28: 100 mL via INTRAVENOUS

## 2021-08-28 MED ORDER — METHOCARBAMOL 500 MG PO TABS: 500.0000 mg | ORAL_TABLET | Freq: Three times a day (TID) | ORAL | 0 refills | Status: DC | PRN

## 2021-08-28 NOTE — Progress Notes (Signed)
Patient ID: Deanna Fischer, female   DOB: 04/19/1961, 61 y.o.   MRN: [id number]  Brief Interventional Radiology Note  CT scan done today 08/28/21 reviewed to assess for possible repeat drain placement.  Unfortunately, we have no window for drainage at this time, the main collections are surrounded by bowel and the previously drained pelvic cul de sac collection is too small. Recommend conservative management with abx and repeat scans in 3 days or so.    Signed,  Criselda Peaches, MD

## 2021-08-28 NOTE — Progress Notes (Signed)
PROGRESS NOTE Deanna Fischer  GYI:[id number] DOB: 1961/05/29 DOA: 08/16/2021 PCP: Patient, No Pcp Per (Inactive)   Brief Narrative/Hospital Course: 53yof w. Hx of lupus and hypothyroidism who presents with several days of abdominal pain and N/V/D , inflammatory process in lower abdomen on CT which could reflect enteritis or appendicitis per radiology, also have pelvic abscess status post drain, IR/general surgery following .WBC continue to trend up on broad-spectrum antibiotic and antifungal on board.  WBC now finally improving BMP stable.  Being treated for pelvic abscess, hyponatremia hypokalemia hypophosphatemia hypomagnesemia lupus and hypothyroidism.  Abscess culture from 2/15 showed abundant E. coli sensitive to cefepime ceftriaxone quinolone. surgery,IR on board-s/p transgluteal drain 2/15, w/ 15 mL purulent fluid aspirated, drain study  2/20 completed Drain culture with E. Coli, on IV antibiotics with Zosyn since 2/13 and antifungal Eraxis 2/17. CT scan 2/18 negative for abscess.Patient's drain is out.Will need colonoscopy outpatient once inflammation resolves.At this time patient is clinically improved leukocytosis almost resolved afebrile tolerating diet. Patient was having joint pain aches so monitored overnight symptom improved with ibuprofen. But with low grade temp, not much appetite and no BM. Repeat CT ordered by Dr Marlou Starks 08/28/21   Subjective: Seen and examined this morning.  Aches in joints better, low grade temp overnight, no BM and still no appetite.  Assessment and Plan: * Pelvic abscess in female Pelvic abscess POA Chronic peritonitis-infectious versus inflammatory enteritis versus appendicitis, possible diverticulitis versus Crohn's: surgery,IR on board-s/p transgluteal drain 2/15, w/ 15 mL purulent fluid aspirated, drain study  2/20 completed Drain culture with E. Coli, on IV antibiotics with Zosyn since 2/13 and antifungal Eraxis 2/17. CT scan 2/18 negative for  abscess. Patient's drain is out.Will need colonoscopy outpatient once inflammation resolves. having joint pain aches so monitored overnight symptom improved with ibuprofen. But with low grade temp, not much appetite and no BM. Repeat CT ordered by Dr Marlou Starks 08/28/21- reviewed by Dr Fredric Dine surgical intervention recommended IR eval- spoke w/ IR MD and not amenable for drainage for now- see IR note. On iv antibiotics for now.  Chronic peritonitis ([place]) See above  Systemic lupus erythematosus ([place])- (present on admission) Patient having generalized pain and discomfort feels reluctant to go home.IMPROVED WITH ibuprofen- likely from patient's SLE was not getting Plaquenil and postop course.  Continue her hydroxychloroquine, followed by [facility] rheumatology.  Electrolyte imbalance Repleted.  Monitor Recent Labs  Lab 08/22/21 0547 08/23/21 0540 08/24/21 0606 08/25/21 0606 08/27/21 0601  K 3.4* 2.8* 3.6 3.4* 4.2  CALCIUM 8.4* 7.9* 8.3* 8.1* 8.4*  MG 1.9 1.7 2.1 1.8 2.1  PHOS  --  2.9  --   --   --     Depression with anxiety- (present on admission) Mood stable, on home meds  Normocytic anemia- (present on admission) Holding in 9 to 10 g range.  Monitor Recent Labs  Lab 08/23/21 0540 08/24/21 0606 08/25/21 0606 08/26/21 0607 08/27/21 0601  HGB 10.3* 11.0* 9.9* 9.9* 10.0*  HCT 29.2* 32.6* 29.3* 29.2* 30.3*    Hypothyroidism- (present on admission) Continue Synthroid.  DVT prophylaxis: enoxaparin (LOVENOX) injection 40 mg Start: 08/24/21 1200 Place and maintain sequential compression device Start: 08/19/21 0801 SCDs Start: 08/17/21 1328 Code Status:   Code Status: Full Code Family Communication: plan of care discussed with patient at bedside.  Disposition: Currently not medically stable for discharge. Status is: Inpatient Remains inpatient appropriate because: Ongoing management of patient's intraabdominal process.   Objective: Vitals last 24 hrs: Vitals:   08/27/21  1256 08/27/21 2133  08/28/21 0449 08/28/21 1301  BP: 97/69 (!) 124/93 (!) 93/53 113/78  Pulse: 78 82 74 70  Resp: 18 18  16   Temp: 98 F (36.7 C) 100.2 F (37.9 C) 98.3 F (36.8 C) 98.4 F (36.9 C)  TempSrc: Oral Oral Oral Oral  SpO2: 94% 97% 94% 96%  Weight:      Height:       Weight change:   Physical Examination: General exam: AA,0X3 older than stated age, weak appearing. HEENT:Oral mucosa moist, Ear/Nose WNL grossly, dentition normal. Respiratory system: bilaterally diminished, no use of accessory muscle Cardiovascular system: S1 & S2 +, No JVD,. Gastrointestinal system: Abdomen soft,mildly tender,ND,BS+ Nervous System:Alert, awake, moving extremities and grossly nonfocal Extremities: LE ankle edema NONE, distal peripheral pulses palpable.  Skin: No rashes,no icterus. MSK: Normal muscle bulk,tone, power    Medications reviewed:  Scheduled Meds:  acetaminophen  1,000 mg Oral Q6H   buPROPion  300 mg Oral Daily   docusate sodium  100 mg Oral Daily   enoxaparin (LOVENOX) injection  40 mg Subcutaneous Q24H   escitalopram  10 mg Oral Daily   feeding supplement  237 mL Oral BID BM   guaiFENesin  1,200 mg Oral BID   hydroxychloroquine  200 mg Oral Daily   levothyroxine  100 mcg Oral Q0600   methocarbamol  500 mg Oral TID   pantoprazole  40 mg Oral Q1200   polyethylene glycol  17 g Oral Daily   protein supplement  2 oz Oral BID   senna-docusate  1 tablet Oral QHS   sodium chloride flush  10 mL Intracatheter Q12H   Continuous Infusions:  sodium chloride     anidulafungin 100 mg (08/28/21 0941)   piperacillin-tazobactam (ZOSYN)  IV 3.375 g (08/28/21 1216)   promethazine (PHENERGAN) injection (IM or IVPB)        Diet Order             DIET SOFT Room service appropriate? Yes; Fluid consistency: Thin  Diet effective now                   Intake/Output Summary (Last 24 hours) at 08/28/2021 1926 Last data filed at 08/28/2021 1700 Gross per 24 hour  Intake 708.52  ml  Output --  Net 708.52 ml   Net IO Since Admission: 9,529.26 mL [08/28/21 1926]  Wt Readings from Last 3 Encounters:  08/16/21 56.7 kg  02/15/21 56.7 kg     Unresulted Labs (From admission, onward)    None     Data Reviewed: I have personally reviewed following labs and imaging studies CBC: Recent Labs  Lab 08/23/21 0540 08/24/21 0606 08/25/21 0606 08/26/21 0607 08/27/21 0601  WBC 15.2* 14.9* 15.4* 14.1* 11.4*  HGB 10.3* 11.0* 9.9* 9.9* 10.0*  HCT 29.2* 32.6* 29.3* 29.2* 30.3*  MCV 94.5 95.9 98.0 100.0 99.0  PLT 468* 178 486* 512* 950*   Basic Metabolic Panel: Recent Labs  Lab 08/22/21 0547 08/23/21 0540 08/24/21 0606 08/25/21 0606 08/27/21 0601  NA 135 132* 133* 131* 136  K 3.4* 2.8* 3.6 3.4* 4.2  CL 96* 94* 96* 97* 101  CO2 29 29 30 29 27   GLUCOSE 83 93 108* 103* 85  BUN <5* <5* <5* 6 6  CREATININE 0.50 0.37* 0.47 0.37* 0.43*  CALCIUM 8.4* 7.9* 8.3* 8.1* 8.4*  MG 1.9 1.7 2.1 1.8 2.1  PHOS  --  2.9  --   --   --    GFR: Estimated Creatinine Clearance: 68.6 mL/min (  A) (by C-G formula based on SCr of 0.43 mg/dL (L)). Liver Function Tests: No results for input(s): AST, ALT, ALKPHOS, BILITOT, PROT, ALBUMIN in the last 168 hours.  No results for input(s): LIPASE, AMYLASE in the last 168 hours. No results for input(s): AMMONIA in the last 168 hours. Coagulation Profile: No results for input(s): INR, PROTIME in the last 168 hours.  Cardiac Enzymes: No results for input(s): CKTOTAL, CKMB, CKMBINDEX, TROPONINI in the last 168 hours. BNP (last 3 results) No results for input(s): PROBNP in the last 8760 hours. HbA1C: No results for input(s): HGBA1C in the last 72 hours. CBG: Recent Labs  Lab 08/27/21 1122 08/27/21 1808 08/27/21 2344 08/28/21 0614 08/28/21 1126  GLUCAP 116* 90 99 92 119*   Lipid Profile: No results for input(s): CHOL, HDL, LDLCALC, TRIG, CHOLHDL, LDLDIRECT in the last 72 hours. Thyroid Function Tests: No results for input(s): TSH,  T4TOTAL, FREET4, T3FREE, THYROIDAB in the last 72 hours. Anemia Panel: No results for input(s): VITAMINB12, FOLATE, FERRITIN, TIBC, IRON, RETICCTPCT in the last 72 hours. Sepsis Labs: Recent Labs  Lab 08/22/21 0547  LATICACIDVEN 1.3    No results found for this or any previous visit (from the past 240 hour(s)).   Antimicrobials: Anti-infectives (From admission, onward)    Start     Dose/Rate Route Frequency Ordered Stop   08/27/21 0000  amoxicillin-clavulanate (AUGMENTIN) 875-125 MG tablet        1 tablet Oral 2 times daily 08/27/21 1006 08/31/21 2359   08/26/21 1000  hydroxychloroquine (PLAQUENIL) tablet 200 mg        200 mg Oral Daily 08/26/21 0913     08/21/21 1000  anidulafungin (ERAXIS) 100 mg in sodium chloride 0.9 % 100 mL IVPB        100 mg 78 mL/hr over 100 Minutes Intravenous Every 24 hours 08/20/21 0724     08/20/21 0900  anidulafungin (ERAXIS) 200 mg in sodium chloride 0.9 % 200 mL IVPB        200 mg 78 mL/hr over 200 Minutes Intravenous  Once 08/20/21 0722 08/20/21 1347   08/17/21 1730  hydroxychloroquine (PLAQUENIL) tablet 200 mg  Status:  Discontinued        200 mg Oral Daily 08/17/21 1630 08/20/21 1225   08/16/21 1815  piperacillin-tazobactam (ZOSYN) IVPB 3.375 g        3.375 g 12.5 mL/hr over 240 Minutes Intravenous Every 8 hours 08/16/21 1805        Culture/Microbiology    Component Value Date/Time   SDES  08/18/2021 1621    ABSCESS Performed at [facility], [place] [address] [place], [place] [address]    SPECREQUEST  08/18/2021 1621    PELVIC Performed at [facility], [place] [address], [place], [place] [address]    CULT  08/18/2021 1621    ABUNDANT ESCHERICHIA COLI ABUNDANT BACTEROIDES THETAIOTAOMICRON BETA LACTAMASE POSITIVE Performed at [facility], [place] [address], [place], [place] [address]    REPTSTATUS 08/25/2021 FINAL 08/18/2021 1621    Other culture-see note  Radiology Studies: CT ABDOMEN  PELVIS W CONTRAST  Result Date: 08/28/2021 CLINICAL DATA:  Abdominal pain, postoperative state EXAM: CT ABDOMEN AND PELVIS WITH CONTRAST TECHNIQUE: Multidetector CT imaging of the abdomen and pelvis was performed using the standard protocol following bolus administration of intravenous contrast. RADIATION DOSE REDUCTION: This exam was performed according to the departmental dose-optimization program which includes automated exposure control, adjustment of the mA and/or kV according to patient size  and/or use of iterative reconstruction technique. CONTRAST:  151m OMNIPAQUE IOHEXOL 300 MG/ML  SOLN COMPARISON:  Previous studies including the examination of 08/21/2021 FINDINGS: Lower chest: There is interval decrease in patchy ground-glass infiltrates in both lower lung fields. There are dense infiltrates in both lower lung fields posteriorly with air bronchograms. There is slight improvement in this finding. Small bilateral pleural effusions are seen. There are scattered coronary artery calcifications. Hepatobiliary: Liver measures 18.8 cm in length. There is fatty infiltration. Gallbladder is less distended. There is no fluid around the gallbladder. There is no significant dilation of bile ducts. Pancreas: There is prominence of pancreatic duct. No focal abnormality is seen. Spleen: Unremarkable. Adrenals/Urinary Tract: Adrenals are not enlarged. There is no hydronephrosis. There are no renal or ureteral stones. Urinary bladder is unremarkable. Stomach/Bowel: Stomach is unremarkable. Small bowel loops are unremarkable. Appendix is not distinctly seen. There is no significant wall thickening in colon. There is no pericolic stranding. There is interval removal drainage catheter from posteroinferior pelvis. There is 2.6 x 1.4 cm loculated fluid collection in the left side of pelvis. There is 4.8 x 2 cm loculated lobulated fluid collection in the right side of pelvis. In image 64 of series 2, there is 5.3 x 2.7 cm  loculated fluid collection in the right side of pelvis. In image 61, there is 4.8 x 2.3 cm loculated fluid collection in the right iliac fossa. There is interval increase in size of these fluid collections. Vascular/Lymphatic: Unremarkable. Reproductive: Uterus is not seen. Uterine cervix is seen. There are no adnexal masses. Other: There is no ascites or pneumoperitoneum. Small umbilical hernia containing fat is seen. Musculoskeletal: Unremarkable IMPRESSION: There is interval removal of percutaneous drainage catheter from right side of pelvis. There is interval appearance and increase in size of multiple loculated fluid collection seen posterior pelvis and right lower quadrant of abdomen as described in the body of the report. Possibility of recurrent abscesses should be considered. There is no evidence of intestinal obstruction or pneumoperitoneum. There is no hydronephrosis. Appendix is not dilated. There is interval decrease in the patchy ground-glass infiltrates in the lower lung fields. Atelectasis/pneumonitis is seen in both lower lung fields with possible slight improvement in aeration. Small bilateral pleural effusions. Other findings as described in the body of the report. Electronically Signed   By: PElmer PickerM.D.   On: 08/28/2021 15:57    LOS: 11 days  RAntonieta Pert MD [facility] 08/28/2021, 7:26 PM

## 2021-08-28 NOTE — Discharge Summary (Signed)
Physician Discharge Summary   Patient: Deanna Fischer MRN: [id number] DOB: [birth year]  Admit date:     08/16/2021  Discharge date: 08/29/21  Discharge Physician: Antonieta Pert   PCP: Patient, No Pcp Per (Inactive)   Recommendations at discharge:    GI for colonoscopy IN 4 WKS  SURGERY follow-up with Dr. Johney Maine within 2 to 3 days for repeat CT abdomen pelvis IN 1 WK as per Dr. Marlou Starks PCP 1 wk CBC, BMP 1 wk  Hospital Course: 77yof w. Hx of lupus and hypothyroidism who presents with several days of abdominal pain and N/V/D , inflammatory process in lower abdomen on CT which could reflect enteritis or appendicitis per radiology, also have pelvic abscess status post drain, IR/general surgery following .WBC continue to trend up on broad-spectrum antibiotic and antifungal on board.  WBC now finally improving BMP stable.  Being treated for pelvic abscess, hyponatremia hypokalemia hypophosphatemia hypomagnesemia lupus and hypothyroidism.  Abscess culture from 2/15 showed abundant E. coli sensitive to cefepime ceftriaxone quinolone. surgery,IR on board-s/p transgluteal drain 2/15, w/ 15 mL purulent fluid aspirated, drain study  2/20 completed Drain culture with E. Coli, on IV antibiotics with Zosyn since 2/13 and antifungal Eraxis 2/17. CT scan 2/18 negative for abscess.Patient's drain is out.Will need colonoscopy outpatient once inflammation resolves.At this time patient is clinically improved leukocytosis almost resolved afebrile tolerating diet. Patient was having joint pain aches so monitored overnight symptom improved with ibuprofen. But with low grade temp, not much appetite and no BM. Repeat CT ordered by Dr Marlou Starks 08/28/21-increase in size of multiple fluid collection in the posterior pelvis and right lower quadrant, reviewed by IR Dr. Murvin Natal: And surgery unable to be drained at this time, no intestinal obstruction and no other significant finding monitored overnight leukocytosis resolved no recurrence of fever  patient feels well having some bowel movement loose-slowly increasing her oral intake.  Seen by Dr. Marlou Starks this morning okay for discharge home with follow-up in few days for repeat CT abdomen for his patient will call Dr. Johney Maine office.  She would like to have home health and will have to see evidence.   Discharge Diagnoses: Principal Problem:   Pelvic abscess in female Active Problems:   Chronic peritonitis ([place])   Systemic lupus erythematosus (H[facility])   Hypothyroidism   Normocytic anemia   Depression with anxiety   Electrolyte imbalance * Pelvic abscess in female Pelvic abscess POA Chronic peritonitis-infectious versus inflammatory enteritis versus appendicitis, possible diverticulitis versus Crohn's: surgery,IR on board-s/p transgluteal drain 2/15, w/ 15 mL purulent fluid aspirated, drain study  2/20 completed Drain culture with E. Coli, on IV antibiotics with Zosyn since 2/13 and antifungal Eraxis 2/17. CT scan 2/18 negative for abscess. Patient's drain is out.Will need colonoscopy outpatient once inflammation resolves. having joint pain aches so monitored overnight symptom improved with ibuprofen. But with low grade temp, not much appetite and no BM. Repeat CT ordered by Dr Marlou Starks 08/28/21- reviewed by Dr Fredric Dine surgical intervention recommended IR eval- spoke w/ IR MD and not amenable for drainage for now- see IR note. On iv antibiotics for now.  Chronic peritonitis ([place]) See above  Systemic lupus erythematosus ([place])- (present on admission) Patient having generalized pain and discomfort feels reluctant to go home.IMPROVED WITH ibuprofen- likely from patient's SLE was not getting Plaquenil and postop course.  Continue her hydroxychloroquine, followed by [facility] rheumatology.  Electrolyte imbalance Repleted.  Monitor Recent Labs  Lab 08/22/21 0547 08/23/21 0540 08/24/21 0606 08/25/21 0606 08/27/21 0601  K 3.4* 2.8* 3.6  3.4* 4.2  CALCIUM 8.4* 7.9* 8.3* 8.1* 8.4*  MG 1.9 1.7 2.1  1.8 2.1  PHOS  --  2.9  --   --   --     Depression with anxiety- (present on admission) Mood stable, on home meds  Normocytic anemia- (present on admission) Holding in 9 to 10 g range.  Monitor Recent Labs  Lab 08/23/21 0540 08/24/21 0606 08/25/21 0606 08/26/21 0607 08/27/21 0601  HGB 10.3* 11.0* 9.9* 9.9* 10.0*  HCT 29.2* 32.6* 29.3* 29.2* 30.3*    Hypothyroidism- (present on admission) Continue Synthroid.     Consultants: surgery,IR Procedures performed: see above  Disposition: Home Diet recommendation:  Diet Orders (From admission, onward)     Start     Ordered   08/29/21 0831  Diet regular Room service appropriate? Yes; Fluid consistency: Thin  Diet effective now       Question Answer Comment  Room service appropriate? Yes   Fluid consistency: Thin      08/29/21 0830             DISCHARGE MEDICATION: Allergies as of 08/29/2021   No Known Allergies      Medication List     TAKE these medications    amoxicillin-clavulanate 875-125 MG tablet Commonly known as: Augmentin Take 1 tablet by mouth 2 (two) times daily for 4 days.   buPROPion 300 MG 24 hr tablet Commonly known as: WELLBUTRIN XL Take 300 mg by mouth daily.   escitalopram 10 MG tablet Commonly known as: LEXAPRO Take 10 mg by mouth daily.   hydroxychloroquine 200 MG tablet Commonly known as: PLAQUENIL Take 200 mg by mouth daily.   ibuprofen 200 MG tablet Commonly known as: ADVIL Take 200 mg by mouth every 6 (six) hours as needed.   levothyroxine 100 MCG tablet Commonly known as: SYNTHROID Take 100 mcg by mouth daily before breakfast.   methocarbamol 500 MG tablet Commonly known as: ROBAXIN Take 1 tablet (500 mg total) by mouth every 8 (eight) hours as needed for up to 30 doses for muscle spasms.   ondansetron 4 MG disintegrating tablet Commonly known as: Zofran ODT Take 1 tablet (4 mg total) by mouth every 8 (eight) hours as needed for nausea or vomiting.   ondansetron 4  MG tablet Commonly known as: ZOFRAN Take 1 tablet (4 mg total) by mouth every 6 (six) hours as needed for up to 3 days for nausea or vomiting.   pantoprazole 40 MG tablet Commonly known as: PROTONIX Take 1 tablet (40 mg total) by mouth daily at 12 noon.   senna 8.6 MG Tabs tablet Commonly known as: SENOKOT Take 1 tablet (8.6 mg total) by mouth daily as needed for mild constipation.        Follow-up Information     [facility] Follow up.   Why: call to arrange appointment to establish care and plan colonscopy in next 6-8 weeks        Gross, Remo Lipps, MD. Call in 1 day(s).   Specialties: General Surgery, Colon and Rectal Surgery Why: 09/14/21 arrive at 11:00 to see Dr Johney Maine. Please bring photo ID and insurance card with you. Previous follow-up was for 3/14 but you need to call office for follow-up visit this week for CT ABDOMEN Contact information: [place] [address] [phone]         [facility] Follow up.   Contact information: [place] [phone]  [phone]                Discharge Exam: Filed Weights   08/16/21 1539  Weight: 56.7 kg  Condition at discharge: good  The results of significant diagnostics from this hospitalization (including imaging, microbiology, ancillary and laboratory) are listed below for reference.   Imaging Studies: CT ABDOMEN PELVIS W CONTRAST  Result Date: 08/28/2021 CLINICAL DATA:  Abdominal pain, postoperative state EXAM: CT ABDOMEN AND PELVIS WITH CONTRAST TECHNIQUE: Multidetector CT imaging of the abdomen and pelvis was performed using the standard protocol following bolus administration of intravenous contrast. RADIATION DOSE REDUCTION: This exam was performed according to the departmental dose-optimization program which includes automated exposure control, adjustment of the mA and/or kV according to patient size  and/or use of iterative reconstruction technique. CONTRAST:  121m OMNIPAQUE IOHEXOL 300 MG/ML  SOLN COMPARISON:  Previous studies including the examination of 08/21/2021 FINDINGS: Lower chest: There is interval decrease in patchy ground-glass infiltrates in both lower lung fields. There are dense infiltrates in both lower lung fields posteriorly with air bronchograms. There is slight improvement in this finding. Small bilateral pleural effusions are seen. There are scattered coronary artery calcifications. Hepatobiliary: Liver measures 18.8 cm in length. There is fatty infiltration. Gallbladder is less distended. There is no fluid around the gallbladder. There is no significant dilation of bile ducts. Pancreas: There is prominence of pancreatic duct. No focal abnormality is seen. Spleen: Unremarkable. Adrenals/Urinary Tract: Adrenals are not enlarged. There is no hydronephrosis. There are no renal or ureteral stones. Urinary bladder is unremarkable. Stomach/Bowel: Stomach is unremarkable. Small bowel loops are unremarkable. Appendix is not distinctly seen. There is no significant wall thickening in colon. There is no pericolic stranding. There is interval removal drainage catheter from posteroinferior pelvis. There is 2.6 x 1.4 cm loculated fluid collection in the left side of pelvis. There is 4.8 x 2 cm loculated lobulated fluid collection in the right side of pelvis. In image 64 of series 2, there is 5.3 x 2.7 cm loculated fluid collection in the right side of pelvis. In image 61, there is 4.8 x 2.3 cm loculated fluid collection in the right iliac fossa. There is interval increase in size of these fluid collections. Vascular/Lymphatic: Unremarkable. Reproductive: Uterus is not seen. Uterine cervix is seen. There are no adnexal masses. Other: There is no ascites or pneumoperitoneum. Small umbilical hernia containing fat is seen. Musculoskeletal: Unremarkable IMPRESSION: There is interval removal of percutaneous  drainage catheter from right side of pelvis. There is interval appearance and increase in size of multiple loculated fluid collection seen posterior pelvis and right lower quadrant of abdomen as described in the body of the report. Possibility of recurrent abscesses should be considered. There is no evidence of intestinal obstruction or pneumoperitoneum. There is no hydronephrosis. Appendix is not dilated. There is interval decrease in the patchy ground-glass infiltrates in the lower lung fields. Atelectasis/pneumonitis is seen in both lower lung fields with possible slight improvement in aeration. Small bilateral pleural effusions. Other findings as described in the body of the report. Electronically Signed   By: PElmer PickerM.D.   On: 08/28/2021 15:57   CT ABDOMEN PELVIS W CONTRAST  Result Date: 08/21/2021 CLINICAL DATA:  Abdominal pain EXAM: CT ABDOMEN AND PELVIS WITH CONTRAST TECHNIQUE: Multidetector CT imaging of the abdomen and pelvis was performed using the standard protocol following bolus administration of intravenous contrast. RADIATION DOSE REDUCTION: This exam was performed according to the departmental dose-optimization program which includes automated exposure  control, adjustment of the mA and/or kV according to patient size and/or use of iterative reconstruction technique. CONTRAST:  194m OMNIPAQUE IOHEXOL 300 MG/ML  SOLN COMPARISON:  08/16/2021 FINDINGS: Lower chest: Coronary artery calcifications are seen. There is interval appearance of small bilateral pleural effusions. There are new infiltrates in the posterior lower lung fields with air bronchograms on both sides suggesting atelectasis/pneumonia. There are new patchy ground-glass infiltrates in the right middle lobe right upper lobe and to a lesser extent in the anterior left upper lobe. Hepatobiliary: Liver measures 18.9 cm in length. No focal abnormality is seen. Gallbladder is distended. There is no wall thickening or  pericholecystic fluid collection. There is slight prominence of bile ducts. Pancreas: No focal abnormality is seen. Spleen: Unremarkable. Adrenals/Urinary Tract: Adrenals are unremarkable. There is no hydronephrosis. There are no renal or ureteral stones. Stomach/Bowel: Small hiatal hernia is seen. Stomach is unremarkable. There is dilation of proximal small-bowel loops measuring up to 4.3 cm. There is no significant dilation of distal small bowel loops. Appendix is not distinctly visualized. There is fluid in the lumen of right colon. There is no significant wall thickening in colon. There is interval placement of pigtail drainage catheter in the posterior pelvis with almost complete resolution of large loculated fluid collection in the cul-de-sac. Small residual pocket of fluid is seen in the right side of pelvis measuring proximally 2.4 x 0.8 cm. Vascular/Lymphatic: Scattered arterial calcifications are seen. Reproductive: Unremarkable. Other: There is no ascites or pneumoperitoneum. There is edema in the subcutaneous plane. Small umbilical hernia containing fat is seen. Musculoskeletal: Unremarkable. IMPRESSION: There is dilation of proximal small bowel loops suggesting possible ileus. Less likely possibility would be partial small bowel obstruction. There is no ascites or pneumoperitoneum. There is no hydronephrosis. There is almost complete resolution of loculated fluid collection in cul-de-sac in pelvis after placement of drainage catheter. There is small residual linear 2.4 x 0.8 cm loculated fluid collection in the right pelvic cavity. Enlarged liver. Gallbladder is distended, possibly due to fasting state. If there is clinical suspicion for acute cholecystitis, gallbladder sonogram may be considered. Small hiatal hernia. Small bilateral pleural effusions. There are dense infiltrates in both lower lobes suggesting atelectasis/pneumonia. There are new faint ground-glass infiltrates in the right middle lobe,  right upper lobe and left upper lobe suggesting multifocal pneumonia. Other findings as described in the body of the report. Electronically Signed   By: PElmer PickerM.D.   On: 08/21/2021 16:01   CT ABDOMEN PELVIS W CONTRAST  Result Date: 08/16/2021 CLINICAL DATA:  Abdominal pain with shortness of breath. Worsening since Saturday. EXAM: CT ABDOMEN AND PELVIS WITH CONTRAST TECHNIQUE: Multidetector CT imaging of the abdomen and pelvis was performed using the standard protocol following bolus administration of intravenous contrast. RADIATION DOSE REDUCTION: This exam was performed according to the departmental dose-optimization program which includes automated exposure control, adjustment of the mA and/or kV according to patient size and/or use of iterative reconstruction technique. CONTRAST:  759mOMNIPAQUE IOHEXOL 300 MG/ML  SOLN COMPARISON:  None. FINDINGS: Lower chest: Mild dependent bibasilar atelectasis. Normal heart size without pericardial or pleural effusion. Tiny hiatal hernia. Hepatobiliary: Normal liver. Normal gallbladder, without biliary ductal dilatation. Pancreas: Normal, without mass or ductal dilatation. Spleen: Normal in size, without focal abnormality. Adrenals/Urinary Tract: Normal adrenal glands. Normal kidneys, without hydronephrosis. Normal urinary bladder. Stomach/Bowel: Normal remainder of the stomach. Anatomy is distorted, presumably secondary to subacute inflammation. The distal small bowel is inflamed. A moderately dilated appendix may be  identified on coronal images 47 through 59. There is extensive interloop fluid including on 54/2. Small bowel loops are mildly dilated, likely due to secondary adynamic ileus. Vascular/Lymphatic: Aortic atherosclerosis. No abdominopelvic adenopathy. Reproductive: Hysterectomy.  No adnexal mass. Other: Pelvic cul-de-sac fluid collection with peritoneal enhancement and thickening, including at 7.8 x 4.3 cm on 63/2. Musculoskeletal: Degenerate disc  disease at L4-5. Transitional S1 vertebral body. IMPRESSION: 1. Constellation of findings, most consistent with an inflammatory lower abdominal and pelvic process, including developing cul-de-sac abscess. Due to anatomic distortion from presumed subacute inflammation, it is difficult to delineate if this process represents infectious or inflammatory enteritis versus appendicitis. Consider surgical consultation for probable pelvic cul-de-sac fluid collection drainage. A potential clinical strategies includes antibiotics and follow-up pelvic CT at 3-4 days. 2. Mild small bowel dilatation, suggesting secondary adynamic ileus. 3.  Aortic Atherosclerosis (ICD10-I70.0). 4.  Tiny hiatal hernia. These results will be called to the ordering clinician or representative by the Radiologist Assistant, and communication documented in the PACS or [employer]. Electronically Signed   By: Abigail Miyamoto M.D.   On: 08/16/2021 17:47   DG CHEST PORT 1 VIEW  Result Date: 08/20/2021 CLINICAL DATA:  New pelvic drain placement 2 days ago. Persistent cough for 2 months. EXAM: PORTABLE CHEST 1 VIEW COMPARISON:  Chest radiographs 04/28/2021.  Abdominal CT 08/16/2021. FINDINGS: 1019 hours. Low lung volumes. The heart size and mediastinal contours are stable. There are patchy opacities at both lung bases which have mildly increased from the recent CT and likely represent atelectasis. There are possible small bilateral pleural effusions. No confluent airspace opacity or pneumothorax. The bones appear unremarkable. IMPRESSION: Increased bibasilar atelectasis and possible small bilateral pleural effusions compared with recent abdominal CT. No confluent airspace opacity. Electronically Signed   By: Richardean Sale M.D.   On: 08/20/2021 10:38   DG FLUORO GUIDED NEEDLE PLC ASPIRATION/INJECTION LOC  Result Date: 08/24/2021 CLINICAL DATA:  Transgluteal drain.  Evaluate for fistula. EXAM: RIGHT TRANSGLUTEAL DRAIN INJECTION (SINOGRAM)  COMPARISON:  CT AP, 08/21/2021.  IR CT, 08/18/2021. CONTRAST:  50 mL Omnipaque 300-administered via the existing percutaneous drain. FLUOROSCOPY: 1.0 minutes.  24.8 mGy. TECHNIQUE: The patient was positioned prone on the fluoroscopy table. A preprocedural spot fluoroscopic image was obtained of the RIGHT transgluteal drainage catheter. Multiple spot fluoroscopic and radiographic images in AP, oblique and lateral positioning were obtained following the injection of a small amount of contrast via the existing percutaneous drainage catheter. FINDINGS: RIGHT transgluteal drain injection with a small volume of residual pelvic collection. No fluoroscopic evidence of enteric fistula. IMPRESSION: RIGHT transgluteal drain injection with small volume residual pelvic collection. No fluoroscopic evidence of fistula. Electronically Signed   By: Michaelle Birks M.D.   On: 08/24/2021 19:49   CT IMAGE GUIDED DRAINAGE BY PERCUTANEOUS CATHETER  Result Date: 08/18/2021 INDICATION: Pelvic abscess EXAM: Procedures: CT-GUIDED RIGHT TRANSGLUTEAL APPROACH PELVIC DRAINAGE CATHETER PLACEMENT RADIATION DOSE REDUCTION: This exam was performed according to the departmental dose-optimization program which includes automated exposure control, adjustment of the mA and/or kV according to patient size and/or use of iterative reconstruction technique. COMPARISON:  CT AP, 08/16/2021 MEDICATIONS: The patient is currently admitted to the hospital and receiving intravenous antibiotics. The antibiotics were administered within an appropriate time frame prior to the initiation of the procedure. ANESTHESIA/SEDATION: Moderate (conscious) sedation was employed during this procedure. A total of Versed 3 mg and Fentanyl 150 mcg was administered intravenously. Moderate Sedation Time: 28 minutes. The patient's level of consciousness and vital signs were  monitored continuously by radiology nursing throughout the procedure under my direct supervision. CONTRAST:   None COMPLICATIONS: None immediate. PROCEDURE: Informed written consent was obtained from the patient and/or patient's representative after a discussion of the risks, benefits and alternatives to treatment. The patient was placed prone on the CT gantry and a pre procedural CT was performed re-demonstrating the known abscess/fluid collection within the pelvis. The procedure was planned. A timeout was performed prior to the initiation of the procedure. The RIGHT gluteal soft tissues were prepped and draped in the usual sterile fashion. The overlying soft tissues were anesthetized with 1% lidocaine with epinephrine. Appropriate trajectory was planned with the use of a 22 gauge spinal needle. An 18 gauge trocar needle was advanced into the abscess/fluid collection and a short Amplatz super stiff wire was coiled within the collection. Appropriate positioning was confirmed with a limited CT scan. The tract was serially dilated allowing placement of a 12 [place] all-purpose drainage catheter. Appropriate positioning was confirmed with a limited postprocedural CT scan. 15 ml of purulent fluid was aspirated. The tube was connected to a bulb suction and sutured in place. A dressing was placed. The patient tolerated the procedure well without immediate post procedural complication. IMPRESSION: Successful CT guided placement of a 12 Fr drainage catheter into the pelvic abscess via a RIGHT transgluteal approach, as above. Samples were sent to the laboratory as requested by the ordering clinical team. Michaelle Birks, MD [facility] Electronically Signed   By: Michaelle Birks M.D.   On: 08/18/2021 17:15    Microbiology: Results for orders placed or performed during the hospital encounter of 08/16/21  Resp Panel by RT-PCR (Flu A&B, Covid) Nasopharyngeal Swab     Status: None   Collection Time: 08/16/21  4:00 PM   Specimen: Nasopharyngeal Swab; Nasopharyngeal(NP) swabs in  vial transport medium  Result Value Ref Range Status   SARS Coronavirus 2 by RT PCR NEGATIVE NEGATIVE Final    Comment: (NOTE) SARS-CoV-2 target nucleic acids are NOT DETECTED.  The SARS-CoV-2 RNA is generally detectable in upper respiratory specimens during the acute phase of infection. The lowest concentration of SARS-CoV-2 viral copies this assay can detect is 138 copies/mL. A negative result does not preclude SARS-Cov-2 infection and should not be used as the sole basis for treatment or other patient management decisions. A negative result may occur with  improper specimen collection/handling, submission of specimen other than nasopharyngeal swab, presence of viral mutation(s) within the areas targeted by this assay, and inadequate number of viral copies(<138 copies/mL). A negative result must be combined with clinical observations, patient history, and epidemiological information. The expected result is Negative.  Fact Sheet for Patients:  EntrepreneurPulse.com.au  Fact Sheet for Healthcare Providers:  IncredibleEmployment.be  This test is no t yet approved or cleared by the [place] FDA and  has been authorized for detection and/or diagnosis of SARS-CoV-2 by FDA under an Emergency Use Authorization (EUA). This EUA will remain  in effect (meaning this test can be used) for the duration of the COVID-19 declaration under Section 564(b)(1) of the Act, 21 U.S.C.section 360bbb-3(b)(1), unless the authorization is terminated  or revoked sooner.       Influenza A by PCR NEGATIVE NEGATIVE Final   Influenza B by PCR NEGATIVE NEGATIVE Final    Comment: (NOTE) The Xpert Xpress SARS-CoV-2/FLU/RSV plus assay is intended as an aid in the diagnosis of influenza from Nasopharyngeal swab specimens and should not be used as a sole  basis for treatment. Nasal washings and aspirates are unacceptable for Xpert Xpress SARS-CoV-2/FLU/RSV testing.  Fact  Sheet for Patients: EntrepreneurPulse.com.au  Fact Sheet for Healthcare Providers: IncredibleEmployment.be  This test is not yet approved or cleared by the [place] FDA and has been authorized for detection and/or diagnosis of SARS-CoV-2 by FDA under an Emergency Use Authorization (EUA). This EUA will remain in effect (meaning this test can be used) for the duration of the COVID-19 declaration under Section 564(b)(1) of the Act, 21 U.S.C. section 360bbb-3(b)(1), unless the authorization is terminated or revoked.  Performed at [facility], [address] [place], [place] [address]   Blood culture (routine x 2)     Status: None   Collection Time: 08/16/21  6:22 PM   Specimen: BLOOD  Result Value Ref Range Status   Specimen Description   Final    BLOOD BLOOD LEFT FOREARM Performed at [facility] [facility], [address] [place], [place] [address]    Special Requests   Final    BOTTLES DRAWN AEROBIC AND ANAEROBIC Blood Culture adequate volume Performed at [facility], [address], [place], [place] [address]    Culture   Final    NO GROWTH 5 DAYS Performed at [facility], [place] [address], [place], [place] [address]    Report Status 08/21/2021 FINAL  Final  Blood culture (routine x 2)     Status: None   Collection Time: 08/16/21  6:25 PM   Specimen: BLOOD LEFT ARM  Result Value Ref Range Status   Specimen Description   Final    BLOOD LEFT ARM Performed at [facility], [address] [place], [place] [address]    Special Requests   Final    BOTTLES DRAWN AEROBIC AND ANAEROBIC Blood Culture adequate volume Performed at [facility], [address] [place], [place] [address]    Culture   Final    NO GROWTH 5 DAYS Performed at [facility], [place] [address], [place], [place] [address]    Report Status 08/21/2021 FINAL  Final   Aerobic/Anaerobic Culture w Gram Stain (surgical/deep wound)     Status: None   Collection Time: 08/18/21  4:21 PM   Specimen: Abscess  Result Value Ref Range Status   Specimen Description   Final    ABSCESS Performed at [facility] [address] [place],  [address]    Special Requests   Final    PELVIC Performed at [facility], [place] [address] [place], [place] [address]    Gram Stain   Final    ABUNDANT WBC PRESENT, PREDOMINANTLY PMN ABUNDANT GRAM NEGATIVE RODS FEW GRAM POSITIVE COCCI IN PAIRS IN CLUSTERS FEW GRAM POSITIVE RODS    Culture   Final    ABUNDANT ESCHERICHIA COLI ABUNDANT BACTEROIDES THETAIOTAOMICRON BETA LACTAMASE POSITIVE Performed at [facility], [place] [address], [place],  [address]    Report Status 08/25/2021 FINAL  Final   Organism ID, Bacteria ESCHERICHIA COLI  Final      Susceptibility   Escherichia coli - MIC*    AMPICILLIN >=32 RESISTANT Resistant     CEFAZOLIN <=4 SENSITIVE Sensitive     CEFEPIME <=0.12 SENSITIVE Sensitive     CEFTAZIDIME <=1 SENSITIVE Sensitive     CEFTRIAXONE <=0.25 SENSITIVE Sensitive     CIPROFLOXACIN <=0.25 SENSITIVE Sensitive     GENTAMICIN <=1 SENSITIVE Sensitive     IMIPENEM <=0.25 SENSITIVE Sensitive  TRIMETH/SULFA >=320 RESISTANT Resistant     AMPICILLIN/SULBACTAM 8 SENSITIVE Sensitive     PIP/TAZO <=4 SENSITIVE Sensitive     * ABUNDANT ESCHERICHIA COLI    Labs: CBC: Recent Labs  Lab 08/24/21 0606 08/25/21 0606 08/26/21 0607 08/27/21 0601 08/29/21 0615  WBC 14.9* 15.4* 14.1* 11.4* 10.2  HGB 11.0* 9.9* 9.9* 10.0* 10.5*  HCT 32.6* 29.3* 29.2* 30.3* 31.8*  MCV 95.9 98.0 100.0 99.0 98.8  PLT 178 486* 512* 568* 122*   Basic Metabolic Panel: Recent Labs  Lab 08/23/21 0540 08/24/21 0606 08/25/21 0606 08/27/21 0601 08/29/21 0615  NA 132* 133* 131* 136 133*  K 2.8* 3.6 3.4* 4.2 4.1  CL 94* 96* 97* 101 97*  CO2 29 30 29 27 28   GLUCOSE 93 108*  103* 85 104*  BUN <5* <5* 6 6 5*  CREATININE 0.37* 0.47 0.37* 0.43* 0.56  CALCIUM 7.9* 8.3* 8.1* 8.4* 9.2  MG 1.7 2.1 1.8 2.1  --   PHOS 2.9  --   --   --   --    Liver Function Tests: No results for input(s): AST, ALT, ALKPHOS, BILITOT, PROT, ALBUMIN in the last 168 hours. CBG: Recent Labs  Lab 08/27/21 2344 08/28/21 0614 08/28/21 1126 08/28/21 2337 08/29/21 0536  GLUCAP 99 92 119* 95 88    Discharge time spent: 35 minutes.  Signed: Antonieta Pert, MD [facility] 08/29/2021

## 2021-08-28 NOTE — TOC Progression Note (Signed)
Transition of Care [facility]) - Progression Note    Patient Details  Name: Deanna Fischer MRN: [id number] Date of Birth: 1960/10/17  Transition of Care [name]) CM/SW Contact  Syncere Kaminski, Marta Lamas, [place] Phone Number: 08/28/2021, 2:19 PM  Clinical Narrative:      If patient does not wish to contact the [facility] and [facility] (information attached to AVS), she can contact her insurance company [facility]) # [phone] for a list of in-network/preferred providers.   Barriers to Discharge: Continued Medical Work up  Expected Discharge Plan and [place] with Self Care  Social Determinants of Health (SDOH) Interventions  N/A   Readmission Risk Interventions No flowsheet data found.

## 2021-08-28 NOTE — Progress Notes (Signed)
Subjective/Chief Complaint: Complains of bloating and lower abd pain   Objective: Vital signs in last 24 hours: Temp:  [98 F (36.7 C)-100.2 F (37.9 C)] 98.3 F (36.8 C) (02/25 0449) Pulse Rate:  [74-82] 74 (02/25 0449) Resp:  [18] 18 (02/24 2133) BP: (93-124)/(53-93) 93/53 (02/25 0449) SpO2:  [94 %-97 %] 94 % (02/25 0449) Last BM Date : 08/26/21  Intake/Output from previous day: No intake/output data recorded. Intake/Output this shift: Total I/O In: 634.6 [IV Piggyback:634.6] Out: -   General appearance: alert and cooperative Resp: clear to auscultation bilaterally Cardio: regular rate and rhythm GI: soft, mild to moderate tenderness over lower abd. No peritonitis  Lab Results:  Recent Labs    08/26/21 0607 08/27/21 0601  WBC 14.1* 11.4*  HGB 9.9* 10.0*  HCT 29.2* 30.3*  PLT 512* 568*   BMET Recent Labs    08/27/21 0601  NA 136  K 4.2  CL 101  CO2 27  GLUCOSE 85  BUN 6  CREATININE 0.43*  CALCIUM 8.4*   PT/INR No results for input(s): LABPROT, INR in the last 72 hours. ABG No results for input(s): PHART, HCO3 in the last 72 hours.  Invalid input(s): PCO2, PO2  Studies/Results: No results found.  Anti-infectives: Anti-infectives (From admission, onward)    Start     Dose/Rate Route Frequency Ordered Stop   08/27/21 0000  amoxicillin-clavulanate (AUGMENTIN) 875-125 MG tablet        1 tablet Oral 2 times daily 08/27/21 1006 08/31/21 2359   08/26/21 1000  hydroxychloroquine (PLAQUENIL) tablet 200 mg        200 mg Oral Daily 08/26/21 0913     08/21/21 1000  anidulafungin (ERAXIS) 100 mg in sodium chloride 0.9 % 100 mL IVPB        100 mg 78 mL/hr over 100 Minutes Intravenous Every 24 hours 08/20/21 0724     08/20/21 0900  anidulafungin (ERAXIS) 200 mg in sodium chloride 0.9 % 200 mL IVPB        200 mg 78 mL/hr over 200 Minutes Intravenous  Once 08/20/21 0722 08/20/21 1347   08/17/21 1730  hydroxychloroquine (PLAQUENIL) tablet 200 mg  Status:   Discontinued        200 mg Oral Daily 08/17/21 1630 08/20/21 1225   08/16/21 1815  piperacillin-tazobactam (ZOSYN) IVPB 3.375 g        3.375 g 12.5 mL/hr over 240 Minutes Intravenous Every 8 hours 08/16/21 1805         Assessment/Plan: s/p * No surgery found * Continue abx Will repeat CT today to make sure everything seems to be improving before she leaves now that drains are out Pelvic abscess Chronic peritonitis - Infectious vs inflammatory enteritis vs appendicitis, possible diverticulitis vs crohn's Leukocytosis  - CT 2/13 showing: "1. Constellation of findings, most consistent with an inflammatory lower abdominal and pelvic process, including developing cul-de-sac abscess. Question if this represents infectious or inflammatory enteritis versus appendicitis. 2. Mild small bowel dilatation, suggesting secondary adynamic ileus." - WBC 11.4 (14.1), afebrile - IR transgluteal drain 2/15 - 15 ml purulent fluid aspirated with placement. Drain study 2/20 without fistula. Drain removed 2/22 - continue IV abx, drain culture - e coli, with several sensitivities  - eraxis added 2/17 - cough - present since PTA. Lasix 2/19 -CT scan 2/18 negative for abscess   - No urgent/emergent surgery recommended at this time. Continue to plan for colonoscopy outpatient once inflammation resolved    Stable for discharge from surgical  standpoint if CT looks ok on 4 days augmentin. She will need to follow up with GI - I have spoken with [facility] and placed their information in her discharge paperwork. Follow up with Dr. Johney Maine scheduled   FEN: soft ID: zosyn 2/13 >>, eraxis 2/17>> VTE: lovenox   Lupus - on plaquenil - reordered today hypothyroidism  LOS: 11 days    Autumn Messing III 08/28/2021

## 2021-08-29 LAB — BASIC METABOLIC PANEL
Anion gap: 8 (ref 5–15)
BUN: 5 mg/dL — ABNORMAL LOW (ref 6–20)
CO2: 28 mmol/L (ref 22–32)
Calcium: 9.2 mg/dL (ref 8.9–10.3)
Chloride: 97 mmol/L — ABNORMAL LOW (ref 98–111)
Creatinine, Ser: 0.56 mg/dL (ref 0.44–1.00)
GFR, Estimated: >60 mL/min (ref >60)
Glucose, Bld: 104 mg/dL — ABNORMAL HIGH (ref 70–99)
Potassium: 4.1 mmol/L (ref 3.5–5.1)
Sodium: 133 mmol/L — ABNORMAL LOW (ref 135–145)

## 2021-08-29 LAB — CBC
HCT: 31.8 % — ABNORMAL LOW (ref 36.0–46.0)
Hemoglobin: 10.5 g/dL — ABNORMAL LOW (ref 12.0–15.0)
MCH: 32.6 pg (ref 26.0–34.0)
MCHC: 33 g/dL (ref 30.0–36.0)
MCV: 98.8 fL (ref 80.0–100.0)
Platelets: 612 10*3/uL — ABNORMAL HIGH (ref 150–400)
RBC: 3.22 MIL/uL — ABNORMAL LOW (ref 3.87–5.11)
RDW: 13.2 % (ref 11.5–15.5)
WBC: 10.2 10*3/uL (ref 4.0–10.5)
nRBC: 0 % (ref 0.0–0.2)

## 2021-08-29 LAB — GLUCOSE, CAPILLARY
Glucose-Capillary: 106 mg/dL — ABNORMAL HIGH (ref 70–99)
Glucose-Capillary: 88 mg/dL (ref 70–99)
Glucose-Capillary: 110 mg/dL — ABNORMAL HIGH (ref 70–99)

## 2021-08-29 NOTE — Progress Notes (Signed)
Discharge instructions explained to patient, and Informed of the medications that were called into her pharmacy that need to be picked up. On the medication sheet, the next dose due is written. Deanna Fischer is aware of the follow up appointments that need to be made. A friend picked up the patient to bring her home. Patient was discharged via wheelchair.

## 2021-08-29 NOTE — Progress Notes (Signed)
Subjective/Chief Complaint: No complaints. Feels better   Objective: Vital signs in last 24 hours: Temp:  [98.4 F (36.9 C)-98.8 F (37.1 C)] 98.8 F (37.1 C) (02/26 0534) Pulse Rate:  [70-82] 73 (02/26 0534) Resp:  [16-17] 17 (02/25 2112) BP: (107-113)/(63-78) 110/75 (02/26 0534) SpO2:  [94 %-96 %] 94 % (02/26 0534) Last BM Date : 08/27/21  Intake/Output from previous day: 02/25 0701 - 02/26 0700 In: 800.1 [IV Piggyback:800.1] Out: -  Intake/Output this shift: No intake/output data recorded.  General appearance: alert and cooperative Resp: clear to auscultation bilaterally Cardio: regular rate and rhythm GI: soft, nontender  Lab Results:  Recent Labs    08/27/21 0601 08/29/21 0615  WBC 11.4* 10.2  HGB 10.0* 10.5*  HCT 30.3* 31.8*  PLT 568* 612*   BMET Recent Labs    08/27/21 0601 08/29/21 0615  NA 136 133*  K 4.2 4.1  CL 101 97*  CO2 27 28  GLUCOSE 85 104*  BUN 6 5*  CREATININE 0.43* 0.56  CALCIUM 8.4* 9.2   PT/INR No results for input(s): LABPROT, INR in the last 72 hours. ABG No results for input(s): PHART, HCO3 in the last 72 hours.  Invalid input(s): PCO2, PO2  Studies/Results: CT ABDOMEN PELVIS W CONTRAST  Result Date: 08/28/2021 CLINICAL DATA:  Abdominal pain, postoperative state EXAM: CT ABDOMEN AND PELVIS WITH CONTRAST TECHNIQUE: Multidetector CT imaging of the abdomen and pelvis was performed using the standard protocol following bolus administration of intravenous contrast. RADIATION DOSE REDUCTION: This exam was performed according to the departmental dose-optimization program which includes automated exposure control, adjustment of the mA and/or kV according to patient size and/or use of iterative reconstruction technique. CONTRAST:  143m OMNIPAQUE IOHEXOL 300 MG/ML  SOLN COMPARISON:  Previous studies including the examination of 08/21/2021 FINDINGS: Lower chest: There is interval decrease in patchy ground-glass infiltrates in both  lower lung fields. There are dense infiltrates in both lower lung fields posteriorly with air bronchograms. There is slight improvement in this finding. Small bilateral pleural effusions are seen. There are scattered coronary artery calcifications. Hepatobiliary: Liver measures 18.8 cm in length. There is fatty infiltration. Gallbladder is less distended. There is no fluid around the gallbladder. There is no significant dilation of bile ducts. Pancreas: There is prominence of pancreatic duct. No focal abnormality is seen. Spleen: Unremarkable. Adrenals/Urinary Tract: Adrenals are not enlarged. There is no hydronephrosis. There are no renal or ureteral stones. Urinary bladder is unremarkable. Stomach/Bowel: Stomach is unremarkable. Small bowel loops are unremarkable. Appendix is not distinctly seen. There is no significant wall thickening in colon. There is no pericolic stranding. There is interval removal drainage catheter from posteroinferior pelvis. There is 2.6 x 1.4 cm loculated fluid collection in the left side of pelvis. There is 4.8 x 2 cm loculated lobulated fluid collection in the right side of pelvis. In image 64 of series 2, there is 5.3 x 2.7 cm loculated fluid collection in the right side of pelvis. In image 61, there is 4.8 x 2.3 cm loculated fluid collection in the right iliac fossa. There is interval increase in size of these fluid collections. Vascular/Lymphatic: Unremarkable. Reproductive: Uterus is not seen. Uterine cervix is seen. There are no adnexal masses. Other: There is no ascites or pneumoperitoneum. Small umbilical hernia containing fat is seen. Musculoskeletal: Unremarkable IMPRESSION: There is interval removal of percutaneous drainage catheter from right side of pelvis. There is interval appearance and increase in size of multiple loculated fluid collection seen posterior pelvis and  right lower quadrant of abdomen as described in the body of the report. Possibility of recurrent abscesses  should be considered. There is no evidence of intestinal obstruction or pneumoperitoneum. There is no hydronephrosis. Appendix is not dilated. There is interval decrease in the patchy ground-glass infiltrates in the lower lung fields. Atelectasis/pneumonitis is seen in both lower lung fields with possible slight improvement in aeration. Small bilateral pleural effusions. Other findings as described in the body of the report. Electronically Signed   By: Elmer Picker M.D.   On: 08/28/2021 15:57    Anti-infectives: Anti-infectives (From admission, onward)    Start     Dose/Rate Route Frequency Ordered Stop   08/27/21 0000  amoxicillin-clavulanate (AUGMENTIN) 875-125 MG tablet        1 tablet Oral 2 times daily 08/27/21 1006 08/31/21 2359   08/26/21 1000  hydroxychloroquine (PLAQUENIL) tablet 200 mg        200 mg Oral Daily 08/26/21 0913     08/21/21 1000  anidulafungin (ERAXIS) 100 mg in sodium chloride 0.9 % 100 mL IVPB        100 mg 78 mL/hr over 100 Minutes Intravenous Every 24 hours 08/20/21 0724     08/20/21 0900  anidulafungin (ERAXIS) 200 mg in sodium chloride 0.9 % 200 mL IVPB        200 mg 78 mL/hr over 200 Minutes Intravenous  Once 08/20/21 0722 08/20/21 1347   08/17/21 1730  hydroxychloroquine (PLAQUENIL) tablet 200 mg  Status:  Discontinued        200 mg Oral Daily 08/17/21 1630 08/20/21 1225   08/16/21 1815  piperacillin-tazobactam (ZOSYN) IVPB 3.375 g        3.375 g 12.5 mL/hr over 240 Minutes Intravenous Every 8 hours 08/16/21 1805         Assessment/Plan: s/p * No surgery found * Advance diet Wbc normal. Pelvic fluid too small to drain. Agree with abx and observation. She could probably switch to oral abx and go home. She will need follow up CT in another week or so and follow up with Dr. Johney Maine in 2 weeks as scheduled  LOS: 12 days    Deanna Fischer 08/29/2021

## 2021-08-29 NOTE — TOC Transition Note (Signed)
Transition of Care [facility]) - CM/SW Discharge Note   Patient Details  Name: Deanna Fischer MRN: [id number] Date of Birth: 10-20-1960  Transition of Care [facility]) CM/SW Contact:  Illene Regulus, LCSW Phone Number: 08/29/2021, 10:47 AM   Clinical Narrative:    CSW spoke with pt about [facility] recommendations, pt agreed with [facility] services and did not have preferred choice. CSW will send information out to [facility] agencies.           Barriers to Discharge: Continued Medical Work up   Patient Goals and CMS Choice        Discharge Placement                       Discharge Plan and Services                                     Social Determinants of Health (SDOH) Interventions     Readmission Risk Interventions No flowsheet data found.

## 2021-08-30 ENCOUNTER — Other Ambulatory Visit (HOSPITAL_BASED_OUTPATIENT_CLINIC_OR_DEPARTMENT_OTHER): Payer: Self-pay | Admitting: Surgery

## 2021-08-30 ENCOUNTER — Other Ambulatory Visit: Payer: Self-pay | Admitting: Surgery

## 2021-08-30 DIAGNOSIS — R188 Other ascites: Secondary | ICD-10-CM

## 2021-08-31 NOTE — Progress Notes (Signed)
POST D/C 08/31/21 11:18am CSW received message for return call to update pt about [facility] services. CSW attempted to contact pt's to inform her of [facility] agency no response left VM.   Pt was set up with [facility] services thorough [place].   Arlie Solomons.Earle Troiano, MSW, [place]   Transitions of Care Clinical Social Worker I Direct Dial: [phone]   Fax: [phone] [email or web address]

## 2021-09-02 ENCOUNTER — Ambulatory Visit (INDEPENDENT_AMBULATORY_CARE_PROVIDER_SITE_OTHER): Payer: Medicare Other | Admitting: Family Medicine

## 2021-09-02 ENCOUNTER — Encounter: Payer: Self-pay | Admitting: Family Medicine

## 2021-09-02 ENCOUNTER — Other Ambulatory Visit: Payer: Self-pay

## 2021-09-02 VITALS — BP 100/64 | HR 79 | Temp 98.5°F | Ht 65.0 in | Wt 117.0 lb

## 2021-09-02 DIAGNOSIS — E538 Deficiency of other specified B group vitamins: Secondary | ICD-10-CM

## 2021-09-02 DIAGNOSIS — E038 Other specified hypothyroidism: Secondary | ICD-10-CM | POA: Diagnosis not present

## 2021-09-02 DIAGNOSIS — N739 Female pelvic inflammatory disease, unspecified: Secondary | ICD-10-CM

## 2021-09-02 DIAGNOSIS — Z1211 Encounter for screening for malignant neoplasm of colon: Secondary | ICD-10-CM

## 2021-09-02 DIAGNOSIS — R5383 Other fatigue: Secondary | ICD-10-CM

## 2021-09-02 DIAGNOSIS — Z803 Family history of malignant neoplasm of breast: Secondary | ICD-10-CM

## 2021-09-02 LAB — CBC WITH DIFFERENTIAL/PLATELET
Basophils Absolute: 0.1 10*3/uL (ref 0.0–0.1)
Basophils Relative: 0.6 % (ref 0.0–3.0)
Eosinophils Absolute: 0.1 10*3/uL (ref 0.0–0.7)
Eosinophils Relative: 0.9 % (ref 0.0–5.0)
HCT: 35.3 % — ABNORMAL LOW (ref 36.0–46.0)
Hemoglobin: 12 g/dL (ref 12.0–15.0)
Lymphocytes Relative: 15.5 % (ref 12.0–46.0)
Lymphs Abs: 1.5 10*3/uL (ref 0.7–4.0)
MCHC: 33.9 g/dL (ref 30.0–36.0)
MCV: 96.3 fl (ref 78.0–100.0)
Monocytes Absolute: 1.4 10*3/uL — ABNORMAL HIGH (ref 0.1–1.0)
Monocytes Relative: 14.1 % — ABNORMAL HIGH (ref 3.0–12.0)
Neutro Abs: 6.7 10*3/uL (ref 1.4–7.7)
Neutrophils Relative %: 68.9 % (ref 43.0–77.0)
Platelets: 641 10*3/uL — ABNORMAL HIGH (ref 150.0–400.0)
RBC: 3.66 Mil/uL — ABNORMAL LOW (ref 3.87–5.11)
RDW: 12.8 % (ref 11.5–15.5)
WBC: 9.7 10*3/uL (ref 4.0–10.5)

## 2021-09-02 LAB — BASIC METABOLIC PANEL
BUN: 8 mg/dL (ref 6–23)
CO2: 26 mEq/L (ref 19–32)
Calcium: 9.8 mg/dL (ref 8.4–10.5)
Chloride: 94 mEq/L — ABNORMAL LOW (ref 96–112)
Creatinine, Ser: 0.62 mg/dL (ref 0.40–1.20)
GFR: 97.87 mL/min (ref >60.00)
Glucose, Bld: 89 mg/dL (ref 70–99)
Potassium: 4 mEq/L (ref 3.5–5.1)
Sodium: 131 mEq/L — ABNORMAL LOW (ref 135–145)

## 2021-09-02 LAB — TSH: TSH: 0.9 u[IU]/mL (ref 0.35–5.50)

## 2021-09-02 MED ORDER — CYANOCOBALAMIN 1000 MCG/ML IJ SOLN
1000.0000 ug | Freq: Once | INTRAMUSCULAR | Status: AC
  Administered 2021-09-02: 1000 ug via INTRAMUSCULAR

## 2021-09-02 NOTE — Progress Notes (Signed)
? ?New Patient Office Visit ? ?Subjective:  ?Patient ID: Deanna Fischer, female    DOB: 1961/04/24  Age: 61 y.o. MRN: [id number] ? ?CC:  ?Chief Complaint  ?Patient presents with  ? Establish Care  ?  Need to new PCP  ? ? ?HPI ?Deanna Fischer presents for new pt ? Admitted to [facility] hospital 08/16/21-08/29/21 for abd pain-dx pelvic abscess-had drain, abx. Etiol still unclear.  Saw surg.  Has f/u CT sch/surg f/u.  E coli from aspirate. Needs cscope in near future(6-8 wks).  Needs repeat cbc/bmp.  Will be starting on cipro/flagyl as vomiting on augmentin.  A little weak and dizzy now.  Had some pneumonia as well pain w/inspiration.- subsequent CT showed improvement.  Having cough/occ insp pain.  Weakness-can't wash own hair.  Getting [place] care.   Had sharp rlq pain last pm ?Hypothyoidism-taking 0.1 synthroid  doing well.  TSH not done w/labs in Feb ?  Was on B12 in past.- nothing for long time ?FH Breast Ca-sch to see genetics on 3/23.  Has breast MRI sch on 3/7-but in [place] so has to cancel d/t health right now.  Wants everything sch in [place] now.  Was getting MRI in [place] ? ?Past Medical History:  ?Diagnosis Date  ? Claustrophobia 08/17/2021  ? Depression with anxiety 08/17/2021  ? Dyslipidemia 08/17/2021  ? 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 Hyperlipidemia   ? Hypothyroidism 08/17/2021  ? Lupus ([place])   ? PTSD (post-traumatic stress disorder) 04/24/2020  ? Systemic lupus erythematosus ([place]) 08/17/2021  ? ? ?History reviewed. No pertinent surgical history. ? ?Family History  ?Problem Relation Age of Onset  ? Depression Mother   ? Cancer Mother   ? Arthritis Mother   ? Breast cancer Mother   ? COPD Mother   ? Heart disease Father   ? Hyperlipidemia Father   ? Alcohol abuse Father   ? Depression Father   ? Coronary artery disease Father   ? Arrhythmia Father   ? Heart attack Father   ? Breast cancer Sister   ? Drug abuse Sister   ? ? ?Social History  ? ?Socioeconomic History  ? Marital status: Single  ?  Spouse name: Not on file  ?  Number of children: Not on file  ? Years of education: Not on file  ? Highest education level: Not on file  ?Occupational History  ? Not on file  ?Tobacco Use  ? Smoking status: Former  ?  Packs/day: 0.50  ?  Years: 25.00  ?  Pack years: 12.50  ?  Types: Cigarettes  ? Smokeless tobacco: Never  ?Vaping Use  ? Vaping Use: Never used  ?Substance and Sexual Activity  ? Alcohol use: Never  ? Drug use: Never  ? Sexual activity: Not Currently  ?Other Topics Concern  ? Not on file  ?Social History Narrative  ? Not on file  ? ?Social Determinants of Health  ? ?Financial Resource Strain: Not on file  ?Food Insecurity: Not on file  ?Transportation Needs: Not on file  ?Physical Activity: Not on file  ?Stress: Not on file  ?Social Connections: Not on file  ?Intimate Partner Violence: Not on file  ? ? ?ROS  ?ROS: ?Gen: no fever, chills  ?Skin: no rash, itching ?ENT: no ear pain, ear drainage, nasal congestion, rhinorrhea, sinus pressure, sore throat ?Eyes: no blurry vision, double vision ?Psych: admitted to hosp-so a lot of stress.   ? ?Objective:  ? ?Today's Vitals: BP 100/64   Pulse 79  Temp 98.5 ?F (36.9 ?C) (Temporal)   Ht _0  (1.651 m)   Wt 117 lb (53.1 kg)   SpO2 97%   BMI 19.47 kg/m?  ? ?Gen: WDWN NAD thin WF ?HEENT: NCAT, conjunctiva not injected, sclera nonicteric ?NECK:  supple, no thyromegaly, no nodes, no carotid bruits ?CARDIAC: RRR, S1S2+, no murmur. DP 2+B ?LUNGS: CTAB. No wheezes ?ABDOMEN:  BS+, soft, diffusely tender esp RLQ, No HSM, no masses ?EXT:  no edema ?MSK: no gross abnormalities. In WC ?NEURO: A&O x3.  CN II-XII intact.  ?PSYCH: normal mood. Good eye contact  ? ?Spent 20 minutes reviewing hosp records PTA today. And prev PCP/mamm/pap/labs.  Spent 4mn w/pt getting hx, discussing symptoms to return to ER, doing some referrals.    Total-524mutes ? ?Prior to ER-labs 08/11/21-wbc 5.6 hgb 12.3, B12 168, D 29, Na 132 K 4.1 Cl 96 Co2 27 bun 11 gluc 91 cr 0.65 ca 9.3 TP 6.9 alb 4.3 TB 0.7 ap 57  ast 28  alt 17.   Pap and hpv neg ? ?08/04/21 rom rheum  CK 94 C4 21  esr 5 C3 87 FR 10.8  crp 1 CCP 2  ANA direct neg ? ?04/28/21 tsh 1.04  LDL 92 ? ?Assessment & Plan:  ? ?Problem List Items Addressed This Visit   ? ?  ? Endocrine  ? Hypothyroidism  ? Relevant Medications  ? levothyroxine (SYNTHROID) 50 MCG tablet  ? Other Relevant Orders  ? TSH (Completed)  ?  ? Other  ? Pelvic abscess in female - Primary  ? Relevant Orders  ? Ambulatory referral to Gastroenterology  ? CBC with Differential/Platelet (Completed)  ? Basic metabolic panel (Completed)  ? B12 deficiency  ? Family history of breast cancer  ? ?Other Visit Diagnoses   ? ? Screening for colon cancer      ? Relevant Orders  ? Ambulatory referral to Gastroenterology  ? Fatigue, unspecified type      ? Relevant Medications  ? cyanocobalamin ((VITAMIN B-12)) injection 1,000 mcg (Completed)  ? ?  ? Hosp f/u. Pelvic abscess-?ruptured tic, appy, other.  Had percutaneous drainage, IV abx.  Now on PO.  Still some pain.  Advised to let surgeon know about RLQ pain last pm.  Discussed if recurs, worse, n/v/f/c-needs to go back to ER.  Check CBC,BMP per hosp d/c summary.  Refer GI for f/u and overdue screening cscope ?Hypothyroidism-on meds.  Check TSH.  Has been stable. ?B12 deficience.  Has fatigue.  Will do injection today, then weekly x4 total, then monthly ?FH Breast Ca and pt w/dense breasts.  [place] genetics and [place].   ? ?Outpatient Encounter Medications as of 09/02/2021  ?Medication Sig  ? acetaminophen (TYLENOL) 325 MG tablet Take by mouth.  ? buPROPion (WELLBUTRIN XL) 300 MG 24 hr tablet Take 300 mg by mouth daily.  ? ciprofloxacin (CIPRO) 500 MG tablet Take 500 mg by mouth 2 (two) times daily.  ? escitalopram (LEXAPRO) 10 MG tablet Take 10 mg by mouth daily.  ? hydroxychloroquine (PLAQUENIL) 200 MG tablet Take 200 mg by mouth daily.  ? ibuprofen (ADVIL) 200 MG tablet Take 200 mg by mouth every 6 (six) hours as needed.  ? levothyroxine  (SYNTHROID) 50 MCG tablet   ? methocarbamol (ROBAXIN) 500 MG tablet Take 1 tablet (500 mg total) by mouth every 8 (eight) hours as needed for up to 30 doses for muscle spasms.  ? ondansetron (ZOFRAN ODT) 4  MG disintegrating tablet Take 1 tablet (4 mg total) by mouth every 8 (eight) hours as needed for nausea or vomiting.  ? pantoprazole (PROTONIX) 40 MG tablet Take 1 tablet (40 mg total) by mouth daily at 12 noon.  ? senna (SENOKOT) 8.6 MG TABS tablet Take 1 tablet (8.6 mg total) by mouth daily as needed for mild constipation.  ? simvastatin (ZOCOR) 10 MG tablet every evening.  ? traMADol (ULTRAM) 50 MG tablet 1 tablet as needed  ? traZODone (DESYREL) 50 MG tablet Take 50 mg by mouth at bedtime as needed.  ? aspirin 81 MG EC tablet aspirin 81 mg tablet,delayed release ? Take 1 tablet every day by oral route. (Patient not taking: Reported on 09/02/2021)  ? predniSONE (DELTASONE) 10 MG tablet prednisone 10 mg tablet (Patient not taking: Reported on 09/02/2021)  ? [DISCONTINUED] levothyroxine (SYNTHROID, LEVOTHROID) 100 MCG tablet Take 100 mcg by mouth daily before breakfast.  ? [EXPIRED] cyanocobalamin ((VITAMIN B-12)) injection 1,000 mcg   ? ?No facility-administered encounter medications on file as of 09/02/2021.  ? ? ?Follow-up: Return in about 4 weeks (around 09/30/2021) for mult..  ? ?Wellington [place], MD ?

## 2021-09-02 NOTE — Patient Instructions (Signed)
Welcome to [employer]! It was a pleasure meeting you today. ? ?As discussed, Please schedule a 1 month follow up visit today. ? ?PLEASE NOTE: ? ?If you had any LAB tests please let us know if you have not heard back within a few days. You may see your results on MyChart before we have a chance to review them but we will give you a call once they are reviewed by [place]. If we ordered any REFERRALS today, please let us know if you have not heard from their office within the next week.  ?Let us know through MyChart if you are needing REFILLS, or have your pharmacy send [place] the request. You can also use MyChart to communicate with me or any office staff. ? ?Please try these tips to maintain a healthy lifestyle: ? ?Eat most of your calories during the day when you are active. Eliminate processed foods including packaged sweets (pies, cakes, cookies), reduce intake of potatoes, white bread, white pasta, and white rice. Look for whole grain options, oat flour or almond flour. ? ?Each meal should contain half fruits/vegetables, one quarter protein, and one quarter carbs (no bigger than a computer mouse). ? ?Cut down on sweet beverages. This includes juice, soda, and sweet tea. Also watch fruit intake, though this is a healthier sweet option, it still contains natural sugar! Limit to 3 servings daily. ? ?Drink at least 1 glass of water with each meal and aim for at least 8 glasses per day ? ?Exercise at least 150 minutes every week.   ?

## 2021-09-03 ENCOUNTER — Encounter: Payer: Self-pay | Admitting: Gastroenterology

## 2021-09-06 ENCOUNTER — Other Ambulatory Visit (HOSPITAL_BASED_OUTPATIENT_CLINIC_OR_DEPARTMENT_OTHER): Payer: Medicare Other

## 2021-09-06 ENCOUNTER — Telehealth: Payer: Self-pay | Admitting: Family Medicine

## 2021-09-06 NOTE — Telephone Encounter (Signed)
Betsy from [facility] : [phone]. ? ?Pt moved Eval to Wednesday. ?Gwinda Passe needs verbal approval for Care date to start 3/8.-   ?

## 2021-09-06 NOTE — Telephone Encounter (Signed)
Left detailed message for Gwinda Passe to return my call for verbal orders.  ?

## 2021-09-06 NOTE — Telephone Encounter (Signed)
Left detailed message on confidential vm for Betsy giving verbal orders. Betsy advised to call office with any questions or concerns.  ?

## 2021-09-06 NOTE — Telephone Encounter (Signed)
Okay to give verbal orders?  ?

## 2021-09-07 ENCOUNTER — Other Ambulatory Visit: Payer: Self-pay

## 2021-09-07 ENCOUNTER — Encounter (HOSPITAL_BASED_OUTPATIENT_CLINIC_OR_DEPARTMENT_OTHER): Payer: Self-pay

## 2021-09-07 ENCOUNTER — Ambulatory Visit (HOSPITAL_BASED_OUTPATIENT_CLINIC_OR_DEPARTMENT_OTHER): Payer: Medicare Other

## 2021-09-07 ENCOUNTER — Telehealth: Payer: Self-pay | Admitting: Family Medicine

## 2021-09-07 ENCOUNTER — Ambulatory Visit (HOSPITAL_BASED_OUTPATIENT_CLINIC_OR_DEPARTMENT_OTHER)
Admission: RE | Admit: 2021-09-07 | Discharge: 2021-09-07 | Disposition: A | Discharge status: Home / Self Care | Payer: Medicare Other | Source: Ambulatory Visit | Attending: Surgery | Admitting: Surgery

## 2021-09-07 DIAGNOSIS — R188 Other ascites: Secondary | ICD-10-CM | POA: Diagnosis not present

## 2021-09-07 MED ORDER — IOHEXOL 300 MG/ML  SOLN
100.0000 mL | Freq: Once | INTRAMUSCULAR | Status: AC | PRN
  Administered 2021-09-07: 75 mL via INTRAVENOUS

## 2021-09-07 NOTE — Telephone Encounter (Signed)
FYI

## 2021-09-07 NOTE — Telephone Encounter (Signed)
Pt stated she wanted to inform Dr Cherlynn Kaiser that she is having a very important CT Scan on 09/07/21. She has already scheduled an appt with Cherlynn Kaiser to discuss the results on 09/10/21. ?

## 2021-09-08 ENCOUNTER — Ambulatory Visit (INDEPENDENT_AMBULATORY_CARE_PROVIDER_SITE_OTHER): Payer: Medicare Other | Admitting: *Deleted

## 2021-09-08 ENCOUNTER — Telehealth: Payer: Self-pay | Admitting: Family Medicine

## 2021-09-08 DIAGNOSIS — E538 Deficiency of other specified B group vitamins: Secondary | ICD-10-CM

## 2021-09-08 MED ORDER — CYANOCOBALAMIN 1000 MCG/ML IJ SOLN
1000.0000 ug | Freq: Once | INTRAMUSCULAR | Status: AC
  Administered 2021-09-08: 1000 ug via INTRAMUSCULAR

## 2021-09-08 NOTE — Telephone Encounter (Signed)
Betsy from [facility] [phone] requesting new verbal order:  ? ?PT ?2x a wk for 2wk  ?1x a wk for 1wk ? ?Approval for social worker, nursing and OT therapy requested in this order as well.  ?

## 2021-09-08 NOTE — Progress Notes (Signed)
Per orders of Dr. Esther Hardy, injection of cyanocobalamin 1000 mcg/ml given IM by Anselmo Pickler, LPN in left deltoid. ?Patient tolerated injection well. Patient will make appointment for 1 week ? ?

## 2021-09-08 NOTE — Telephone Encounter (Signed)
Spoke to [place], gave verbal orders as requested per Dr. Cherlynn Kaiser. ?

## 2021-09-09 ENCOUNTER — Ambulatory Visit: Payer: Medicare Other

## 2021-09-10 ENCOUNTER — Ambulatory Visit: Payer: Medicare Other | Admitting: Family Medicine

## 2021-09-10 ENCOUNTER — Ambulatory Visit: Payer: Medicare Other | Admitting: Physician Assistant

## 2021-09-14 ENCOUNTER — Ambulatory Visit
Admission: RE | Admit: 2021-09-14 | Discharge: 2021-09-14 | Disposition: A | Discharge status: Home / Self Care | Payer: Medicare Other | Source: Ambulatory Visit | Attending: Family Medicine | Admitting: Family Medicine

## 2021-09-14 ENCOUNTER — Ambulatory Visit: Payer: Self-pay | Admitting: Surgery

## 2021-09-14 DIAGNOSIS — Z803 Family history of malignant neoplasm of breast: Secondary | ICD-10-CM

## 2021-09-14 DIAGNOSIS — R739 Hyperglycemia, unspecified: Secondary | ICD-10-CM

## 2021-09-14 MED ORDER — GADOBUTROL 1 MMOL/ML IV SOLN
6.0000 mL | Freq: Once | INTRAVENOUS | Status: AC | PRN
  Administered 2021-09-14: 6 mL via INTRAVENOUS

## 2021-09-15 ENCOUNTER — Telehealth: Payer: Self-pay | Admitting: Family Medicine

## 2021-09-15 ENCOUNTER — Telehealth: Payer: Self-pay

## 2021-09-15 DIAGNOSIS — K529 Noninfective gastroenteritis and colitis, unspecified: Secondary | ICD-10-CM

## 2021-09-15 NOTE — Telephone Encounter (Signed)
Error

## 2021-09-15 NOTE — Telephone Encounter (Signed)
Pt states she has had a Breast MRI due to breast pain and wanted to make sure Dr Cherlynn Kaiser has a chance to look at it before her next appt on 09/22/21.  ?

## 2021-09-15 NOTE — Telephone Encounter (Signed)
Deanna Fischer from [facility] called to give results of breast MRI with suspicious findings. Pt has also called & I sent you that message as well. Here are radiologist recommendations: ? ?RECOMMENDATION: ?MRI biopsy of the right breast non mass enhancement ?  ?MRI biopsy of the left breast mass. ?  ?BI-RADS CATEGORY  4: Suspicious. ?

## 2021-09-15 NOTE — Telephone Encounter (Signed)
Received call from [facility] and Dr. Johney Maine wants pt to have colon the day before her surgery on 11/10/21. Pt scheduled for colon in the [place] 11/09/21 at 9:30am. Left message for pt to call back. ?

## 2021-09-15 NOTE — Telephone Encounter (Signed)
Patient called back in and states she seen her results and is very upset about the results. Patient wanted to speak to [facility] about her results I told her the best thing to do would be to make appt so she be able to ask all questions and talk about the MRI. Patient is scheduled for 09/16/21 at 11:30am.  ?

## 2021-09-16 ENCOUNTER — Other Ambulatory Visit: Payer: Self-pay | Admitting: Family Medicine

## 2021-09-16 ENCOUNTER — Ambulatory Visit: Payer: Medicare Other

## 2021-09-16 ENCOUNTER — Encounter: Payer: Self-pay | Admitting: Family Medicine

## 2021-09-16 ENCOUNTER — Ambulatory Visit (INDEPENDENT_AMBULATORY_CARE_PROVIDER_SITE_OTHER): Payer: Medicare Other | Admitting: Family Medicine

## 2021-09-16 DIAGNOSIS — R928 Other abnormal and inconclusive findings on diagnostic imaging of breast: Secondary | ICD-10-CM

## 2021-09-16 DIAGNOSIS — F419 Anxiety disorder, unspecified: Secondary | ICD-10-CM

## 2021-09-16 DIAGNOSIS — N739 Female pelvic inflammatory disease, unspecified: Secondary | ICD-10-CM

## 2021-09-16 DIAGNOSIS — Z803 Family history of malignant neoplasm of breast: Secondary | ICD-10-CM

## 2021-09-16 MED ORDER — LORAZEPAM 0.5 MG PO TABS: 0.5000 mg | ORAL_TABLET | Freq: Three times a day (TID) | ORAL | 1 refills | Status: DC | PRN

## 2021-09-16 NOTE — Progress Notes (Signed)
? ?Subjective:  ? ? ? Patient ID: Deanna Fischer, female    DOB: 22-Dec-1960, 61 y.o.   MRN: [id number] ? ?Chief Complaint  ?Patient presents with  ? Discuss MRI results  ? ? ?HPI ?Had abn breast MRI yest.  Here to discuss results(which she saw on-line yesterday) ?Upset, but got plan on surgery for pelvic abscess-has concerns surgery isn't till May-talked a lot about this. ?Was at social services yesterday trying to keep gas from being cut off.  Then got e chart message about MRI. ? ?+FH breast Ca ? ?Estranged from family ? ?Anxious-not taking tramadol.  Wants prn meds for anxiety.  No SI ? ?Health Maintenance Due  ?Topic Date Due  ? Hepatitis C Screening  Never done  ? COLONOSCOPY (Pts 45-54yr Insurance coverage will need to be confirmed)  Never done  ? Zoster Vaccines- Shingrix (1 of 2) Never done  ? ? ?Past Medical History:  ?Diagnosis Date  ? Claustrophobia 08/17/2021  ? Depression with anxiety 08/17/2021  ? Dyslipidemia 08/17/2021  ? 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 Hyperlipidemia   ? Hypothyroidism 08/17/2021  ? Lupus ([place]   ? PTSD (post-traumatic stress disorder) 04/24/2020  ? Systemic lupus erythematosus ([place] 08/17/2021  ? ? ?History reviewed. No pertinent surgical history. ? ?Outpatient Medications Prior to Visit  ?Medication Sig Dispense Refill  ? acetaminophen (TYLENOL) 325 MG tablet Take by mouth.    ? buPROPion (WELLBUTRIN XL) 300 MG 24 hr tablet Take 300 mg by mouth daily.    ? ciprofloxacin (CIPRO) 500 MG tablet Take 500 mg by mouth 2 (two) times daily.    ? escitalopram (LEXAPRO) 10 MG tablet Take 10 mg by mouth daily.    ? fluconazole (DIFLUCAN) 150 MG tablet Take 150 mg by mouth once.    ? hydroxychloroquine (PLAQUENIL) 200 MG tablet Take 200 mg by mouth daily.    ? ibuprofen (ADVIL) 200 MG tablet Take 200 mg by mouth every 6 (six) hours as needed.    ? levothyroxine (SYNTHROID) 50 MCG tablet     ? metroNIDAZOLE (FLAGYL) 500 MG tablet Take 500 mg by mouth 3 (three) times daily.    ? ondansetron (ZOFRAN  ODT) 4 MG disintegrating tablet Take 1 tablet (4 mg total) by mouth every 8 (eight) hours as needed for nausea or vomiting. 20 tablet 0  ? senna (SENOKOT) 8.6 MG TABS tablet Take 1 tablet (8.6 mg total) by mouth daily as needed for mild constipation. 120 tablet 0  ? simvastatin (ZOCOR) 10 MG tablet every evening.    ? traZODone (DESYREL) 50 MG tablet Take 50 mg by mouth at bedtime as needed.    ? traMADol (ULTRAM) 50 MG tablet 1 tablet as needed    ? ?No facility-administered medications prior to visit.  ? ? ?Allergies  ?Allergen Reactions  ? Amoxicillin Nausea And Vomiting  ?  Caused nausea, vomiting and diarrhea  ? ?ROS neg/noncontributory except as noted HPI/below ? ? ?   ?Objective:  ?  ? ?There were no vitals taken for this visit. ?Wt Readings from Last 3 Encounters:  ?09/02/21 117 lb (53.1 kg)  ?08/16/21 125 lb (56.7 kg)  ?02/15/21 125 lb (56.7 kg)  ? ? ?Physical Exam  ? ?Gen: WDWN NAD anxious WF ?HEENT: NCAT, conjunctiva not injected, sclera nonicteric ?MSK: no gross abnormalities.  ?NEURO: A&O x3.  CN II-XII intact.  ?PSYCH: normal mood. Good eye contact ? ?Discussed MRI report, plan for surgery for pelvic abscess/sepsis, lack of family support ?Reviewed CT report,  surgeon notes.  Listened to pt voice concerns. ?I called Rad at 8:15 this am and got Bx set up for 3/22.   ? ?Total: 50 minutes ? ?   ?Assessment & Plan:  ? ?Problem List Items Addressed This Visit   ? ?  ? Other  ? Pelvic abscess in female  ? Family history of breast cancer  ? ?Other Visit Diagnoses   ? ? Abnormal mammogram    -  Primary  ? Anxiety      ? Relevant Medications  ? LORazepam (ATIVAN) 0.5 MG tablet  ? ?  ?Checked PDMP ? Abnormal MRI breasts-Bilat-discussed may be benign, but w/FH, could very well be cancer.  Bx set up for next wk.  They will contact pt as well.   ?FH breast ca-makes abn MRI higher risk for malignancy-pt very aware ?Anxiety-situational-will do lorazepam ?Pelvic abscess-resolved-etiol unclear.  Diplomatic Services operational officer.  Surgery  sch for May-pt wants sooner-she is looking into options ? ?Meds ordered this encounter  ?Medications  ? LORazepam (ATIVAN) 0.5 MG tablet  ?  Sig: Take 1 tablet (0.5 mg total) by mouth every 8 (eight) hours as needed for anxiety.  ?  Dispense:  20 tablet  ?  Refill:  1  ? ? ?Wellington Hampshire, MD ? ?

## 2021-09-22 ENCOUNTER — Encounter: Payer: Self-pay | Admitting: Family Medicine

## 2021-09-22 ENCOUNTER — Other Ambulatory Visit (HOSPITAL_COMMUNITY): Payer: Self-pay | Admitting: Diagnostic Radiology

## 2021-09-22 ENCOUNTER — Other Ambulatory Visit: Payer: Self-pay

## 2021-09-22 ENCOUNTER — Ambulatory Visit
Admission: RE | Admit: 2021-09-22 | Discharge: 2021-09-22 | Disposition: A | Discharge status: Home / Self Care | Payer: Medicare Other | Source: Ambulatory Visit | Attending: Family Medicine | Admitting: Family Medicine

## 2021-09-22 ENCOUNTER — Ambulatory Visit (INDEPENDENT_AMBULATORY_CARE_PROVIDER_SITE_OTHER): Payer: Medicare Other | Admitting: Family Medicine

## 2021-09-22 ENCOUNTER — Ambulatory Visit: Payer: Medicare Other | Admitting: Gastroenterology

## 2021-09-22 ENCOUNTER — Ambulatory Visit: Payer: Medicare Other | Admitting: Family Medicine

## 2021-09-22 VITALS — BP 110/72 | HR 78 | Temp 98.4°F | Ht 65.0 in

## 2021-09-22 DIAGNOSIS — E538 Deficiency of other specified B group vitamins: Secondary | ICD-10-CM | POA: Diagnosis not present

## 2021-09-22 DIAGNOSIS — R051 Acute cough: Secondary | ICD-10-CM

## 2021-09-22 DIAGNOSIS — R928 Other abnormal and inconclusive findings on diagnostic imaging of breast: Secondary | ICD-10-CM

## 2021-09-22 DIAGNOSIS — B370 Candidal stomatitis: Secondary | ICD-10-CM

## 2021-09-22 DIAGNOSIS — R1084 Generalized abdominal pain: Secondary | ICD-10-CM | POA: Diagnosis not present

## 2021-09-22 LAB — CBC WITH DIFFERENTIAL/PLATELET
Basophils Absolute: 0 10*3/uL (ref 0.0–0.1)
Basophils Relative: 0.6 % (ref 0.0–3.0)
Eosinophils Absolute: 0.1 10*3/uL (ref 0.0–0.7)
Eosinophils Relative: 1.7 % (ref 0.0–5.0)
HCT: 31.5 % — ABNORMAL LOW (ref 36.0–46.0)
Hemoglobin: 10.8 g/dL — ABNORMAL LOW (ref 12.0–15.0)
Lymphocytes Relative: 25.9 % (ref 12.0–46.0)
Lymphs Abs: 1.3 10*3/uL (ref 0.7–4.0)
MCHC: 34.4 g/dL (ref 30.0–36.0)
MCV: 97 fl (ref 78.0–100.0)
Monocytes Absolute: 0.6 10*3/uL (ref 0.1–1.0)
Monocytes Relative: 12.5 % — ABNORMAL HIGH (ref 3.0–12.0)
Neutro Abs: 3.1 10*3/uL (ref 1.4–7.7)
Neutrophils Relative %: 59.3 % (ref 43.0–77.0)
Platelets: 280 10*3/uL (ref 150.0–400.0)
RBC: 3.25 Mil/uL — ABNORMAL LOW (ref 3.87–5.11)
RDW: 13.6 % (ref 11.5–15.5)
WBC: 5.2 10*3/uL (ref 4.0–10.5)

## 2021-09-22 MED ORDER — TRAMADOL HCL 50 MG PO TABS
ORAL_TABLET | ORAL | 1 refills | Status: DC
Start: 2021-09-22 — End: 2021-11-16

## 2021-09-22 MED ORDER — CYANOCOBALAMIN 1000 MCG/ML IJ SOLN
1000.0000 ug | Freq: Once | INTRAMUSCULAR | Status: AC
  Administered 2021-09-22: 1000 ug via INTRAMUSCULAR

## 2021-09-22 MED ORDER — GADOBUTROL 1 MMOL/ML IV SOLN
4.0000 mL | Freq: Once | INTRAVENOUS | Status: AC | PRN
  Administered 2021-09-22: 4 mL via INTRAVENOUS

## 2021-09-22 MED ORDER — NYSTATIN 100000 UNIT/ML MT SUSP: 5.0000 mL | Freq: Four times a day (QID) | OROMUCOSAL | 0 refills | Status: DC

## 2021-09-22 NOTE — Patient Instructions (Signed)
It was very nice to see you today! ? ?Deanna Fischer  [address] in 8:30-5  closed 12:30-1 ? ? ?PLEASE NOTE: ? ?If you had any lab tests please let us know if you have not heard back within a few days. You may see your results on MyChart before we have a chance to review them but we will give you a call once they are reviewed by [place]. If we ordered any referrals today, please let us know if you have not heard from their office within the next week.  ? ?Please try these tips to maintain a healthy lifestyle: ? ?Eat most of your calories during the day when you are active. Eliminate processed foods including packaged sweets (pies, cakes, cookies), reduce intake of potatoes, white bread, white pasta, and white rice. Look for whole grain options, oat flour or almond flour. ? ?Each meal should contain half fruits/vegetables, one quarter protein, and one quarter carbs (no bigger than a computer mouse). ? ?Cut down on sweet beverages. This includes juice, soda, and sweet tea. Also watch fruit intake, though this is a healthier sweet option, it still contains natural sugar! Limit to 3 servings daily. ? ?Drink at least 1 glass of water with each meal and aim for at least 8 glasses per day ? ?Exercise at least 150 minutes every week.   ?

## 2021-09-22 NOTE — Progress Notes (Signed)
? ?Subjective:  ? ? ? Patient ID: Deanna Fischer, female    DOB: 1960/08/25, 61 y.o.   MRN: [id number] ? ?Chief Complaint  ?Patient presents with  ? Follow-up  ?  Abdominal pain came back, would like white count rechecked, surgery in April  ? ? ?HPI ?B12 def-has done shots in past.  Was low and regular shots in [place].  Requesting now-got 2+wks ago. ?Abn mamm-had bx today ? ?Abd pain-had pus pocket.  Getting some pain-more when taking deep breath RUQ.  Told gallbladder sludge in past.  No f/c.  Still on abx   surg sch for 4/27.  No vomiting.  ?Not eating much.  Req refill for tramadol for abd.  Xanax too drowsy so not taking ?Cough-dry for 2 days ? ?Health Maintenance Due  ?Topic Date Due  ? Hepatitis C Screening  Never done  ? COLONOSCOPY (Pts 45-35yr Insurance coverage will need to be confirmed)  Never done  ? Zoster Vaccines- Shingrix (1 of 2) Never done  ? ? ?Past Medical History:  ?Diagnosis Date  ? Claustrophobia 08/17/2021  ? Depression with anxiety 08/17/2021  ? Dyslipidemia 08/17/2021  ? 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 Hyperlipidemia   ? Hypothyroidism 08/17/2021  ? Lupus ([place]   ? PTSD (post-traumatic stress disorder) 04/24/2020  ? Systemic lupus erythematosus ([place] 08/17/2021  ? ? ?History reviewed. No pertinent surgical history. ? ?Outpatient Medications Prior to Visit  ?Medication Sig Dispense Refill  ? acetaminophen (TYLENOL) 325 MG tablet Take by mouth.    ? buPROPion (WELLBUTRIN XL) 300 MG 24 hr tablet Take 300 mg by mouth daily.    ? ciprofloxacin (CIPRO) 500 MG tablet Take 500 mg by mouth 2 (two) times daily.    ? escitalopram (LEXAPRO) 20 MG tablet Take 20 mg by mouth daily.    ? fluconazole (DIFLUCAN) 150 MG tablet Take 150 mg by mouth once.    ? hydroxychloroquine (PLAQUENIL) 200 MG tablet Take 200 mg by mouth daily.    ? ibuprofen (ADVIL) 200 MG tablet Take 200 mg by mouth every 6 (six) hours as needed.    ? levothyroxine (SYNTHROID) 50 MCG tablet     ? metroNIDAZOLE (FLAGYL) 500 MG tablet Take 500 mg  by mouth 3 (three) times daily.    ? ondansetron (ZOFRAN ODT) 4 MG disintegrating tablet Take 1 tablet (4 mg total) by mouth every 8 (eight) hours as needed for nausea or vomiting. 20 tablet 0  ? traZODone (DESYREL) 50 MG tablet Take 50 mg by mouth at bedtime as needed.    ? LORazepam (ATIVAN) 0.5 MG tablet Take 1 tablet (0.5 mg total) by mouth every 8 (eight) hours as needed for anxiety. 20 tablet 1  ? traMADol (ULTRAM) 50 MG tablet 1 tablet as needed    ? senna (SENOKOT) 8.6 MG TABS tablet Take 1 tablet (8.6 mg total) by mouth daily as needed for mild constipation. (Patient not taking: Reported on 09/22/2021) 120 tablet 0  ? simvastatin (ZOCOR) 10 MG tablet every evening.    ? ?No facility-administered medications prior to visit.  ? ? ?Allergies  ?Allergen Reactions  ? Amoxicillin Nausea And Vomiting  ?  Caused nausea, vomiting and diarrhea  ? ?ROS neg/noncontributory except as noted HPI/below ? ? ?   ?Objective:  ?  ? ?BP 110/72   Pulse 78   Temp 98.4 ?F (36.9 ?C) (Temporal)   Ht 5' 5"  (1.651 m)   SpO2 96%   BMI 19.47 kg/m?  ?Wt Readings from Last  3 Encounters:  ?09/02/21 117 lb (53.1 kg)  ?08/16/21 125 lb (56.7 kg)  ?02/15/21 125 lb (56.7 kg)  ? ? ?Physical Exam  ? ?Gen: WDWN NAD WF ?HEENT: NCAT, conjunctiva not injected, sclera nonicteric ?Tongue white ?NECK:  supple, no thyromegaly, no nodes, no carotid bruits ?CARDIAC: RRR, S1S2+, no murmur. DP 2+B ?LUNGS: CTAB. No wheezes ?ABDOMEN:  BS+, soft, tender all over No HSM, no masses ?EXT:  no edema ?MSK: no gross abnormalities.  ?NEURO: A&O x3.  CN II-XII intact.  ?PSYCH: normal mood. Good eye contact ? ?   ?Assessment & Plan:  ? ?Problem List Items Addressed This Visit   ? ?  ? Other  ? B12 deficiency  ? ?Other Visit Diagnoses   ? ? Generalized abdominal pain    -  Primary  ? Relevant Orders  ? CBC with Differential/Platelet  ? Comprehensive metabolic panel  ? US ABDOMEN LIMITED RUQ (LIVER/GB)  ? Acute cough      ? Relevant Orders  ? DG Chest 2 View  ? Thrush       ? Relevant Medications  ? nystatin (MYCOSTATIN) 100000 UNIT/ML suspension  ? ?  ? Abd pain-has had abscess-on abx.  Surg sch for 4/27-some inc pain-also w/gallbladder sludge.  Will check cbc/cmp.  Tramadol for pain.  Check U/s ruq for acute chole/sliudge.  Touch base w/surg re inc pain ?Cough-dry  check CXR as had pleural effusions that resolved.  ? If liver inflammation irrit lungs as well.   ?Thrush-nystatin ?B12 deficiency-given injection by nurse today.   ?Abn mamm-had bx today   await path ? ?Meds ordered this encounter  ?Medications  ? traMADol (ULTRAM) 50 MG tablet  ?  Sig: 1 tablet q8hrs prn pain  ?  Dispense:  30 tablet  ?  Refill:  1  ? nystatin (MYCOSTATIN) 100000 UNIT/ML suspension  ?  Sig: Take 5 mLs (500,000 Units total) by mouth 4 (four) times daily.  ?  Dispense:  60 mL  ?  Refill:  0  ? ? ?Wellington Hampshire, MD ? ?

## 2021-09-23 LAB — COMPREHENSIVE METABOLIC PANEL
ALT: 23 U/L (ref 0–35)
AST: 33 U/L (ref 0–37)
Albumin: 4 g/dL (ref 3.5–5.2)
Alkaline Phosphatase: 44 U/L (ref 39–117)
BUN: 6 mg/dL (ref 6–23)
CO2: 30 mEq/L (ref 19–32)
Calcium: 9.4 mg/dL (ref 8.4–10.5)
Chloride: 101 mEq/L (ref 96–112)
Creatinine, Ser: 0.52 mg/dL (ref 0.40–1.20)
GFR: 102.07 mL/min (ref >60.00)
Glucose, Bld: 101 mg/dL — ABNORMAL HIGH (ref 70–99)
Potassium: 4 mEq/L (ref 3.5–5.1)
Sodium: 137 mEq/L (ref 135–145)
Total Bilirubin: 0.4 mg/dL (ref 0.2–1.2)
Total Protein: 6.6 g/dL (ref 6.0–8.3)

## 2021-09-28 ENCOUNTER — Encounter: Payer: Self-pay | Admitting: Gastroenterology

## 2021-09-28 ENCOUNTER — Ambulatory Visit (INDEPENDENT_AMBULATORY_CARE_PROVIDER_SITE_OTHER): Payer: Medicare Other | Admitting: Gastroenterology

## 2021-09-28 VITALS — BP 112/68 | HR 84 | Ht 65.0 in | Wt 115.0 lb

## 2021-09-28 DIAGNOSIS — L02211 Cutaneous abscess of abdominal wall: Secondary | ICD-10-CM

## 2021-09-28 DIAGNOSIS — N739 Female pelvic inflammatory disease, unspecified: Secondary | ICD-10-CM | POA: Diagnosis not present

## 2021-09-28 MED ORDER — NA SULFATE-K SULFATE-MG SULF 17.5-3.13-1.6 GM/177ML PO SOLN: 1.0000 | Freq: Once | ORAL | 0 refills | Status: AC

## 2021-09-28 NOTE — Patient Instructions (Signed)
If you are age 59 or older, your body mass index should be between 23-30. Your Body mass index is 19.14 kg/m?[name] If this is out of the aforementioned range listed, please consider follow up with your Primary Care Provider. ? ?If you are age 71 or younger, your body mass index should be between 19-25. Your Body mass index is 19.14 kg/m?[name] If this is out of the aformentioned range listed, please consider follow up with your Primary Care Provider.  ? ?You have been scheduled for a colonoscopy. Please follow written instructions given to you at your visit today.  ?Please pick up your prep supplies at the pharmacy within the next 1-3 days. ?If you use inhalers (even only as needed), please bring them with you on the day of your procedure.  ? ?Start Metamucil daily. ? ?The [facility] providers would like to encourage you to use [facility] to communicate with providers for non-urgent requests or questions.  Due to long hold times on the telephone, sending your provider a message by [facility] may be a faster and more efficient way to get a response.  Please allow 48 business hours for a response.  Please remember that this is for non-urgent requests.  ? ?It was a pleasure to see you today! ? ?Thank you for trusting me with your gastrointestinal care!   ? ?Scott E.Candis Schatz, MD ? ?

## 2021-09-28 NOTE — Progress Notes (Signed)
? ?HPI : Deanna Fischer is a very pleasant 61 year old female with a history of lupus, depression and PTSD who is referred to [place] by Dr. Carmon Sails for colonoscopy following her recent hospitalization for intra-abdominal abscess.  Patient states that she had never had any chronic GI symptoms, but experienced worsening abdominal pain with associated nausea and vomiting over a period of 5 days in February which prompted her to go to the emergency department on February 13.  A CT scan performed in the ED showed an intra-abdominal/pelvic abscess of unclear etiology.  There is no obvious evidence of diverticulitis.  There was small bowel wall thickening/enteritis.  She was treated with percutaneous drain placement and antibiotics and discharged after 2 weeks.  The drain was removed prior to discharge.  Blood cultures were negative, but abscess cultures grew E. coli and Bacteroides.  She continued to have abdominal pain following her discharge and a repeat CT April 4 showed resolution of the abscess, but continued inflammatory changes that were concerning for appendicitis.   ? ?She was seen by Dr. Johney Maine of [facility] surgical, and she is tentatively planned for surgery April 27. ? ?Today, patient reports ongoing issues with abdominal pain and poor appetite as well as constipation, however pain is nowhere near what it was back in February.  Diarrhea is not a problem for her.  She is not seeing any blood in the stool.  She denies any fevers or chills recently. ? ?Past Medical History:  ?Diagnosis Date  ? Claustrophobia 08/17/2021  ? Depression with anxiety 08/17/2021  ? Dyslipidemia 08/17/2021  ? 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 Hyperlipidemia   ? Hypothyroidism 08/17/2021  ? Lupus ([place])   ? PTSD (post-traumatic stress disorder) 04/24/2020  ? Systemic lupus erythematosus ([place]) 08/17/2021  ? ? ? ?History reviewed. No pertinent surgical history. ?Family History  ?Problem Relation Age of Onset  ? Depression Mother   ?  Cancer Mother   ? Arthritis Mother   ? Breast cancer Mother   ? COPD Mother   ? Heart disease Father   ? Hyperlipidemia Father   ? Alcohol abuse Father   ? Depression Father   ? Coronary artery disease Father   ? Arrhythmia Father   ? Heart attack Father   ? Breast cancer Sister   ? Drug abuse Sister   ? Colon cancer Neg Hx   ? Esophageal cancer Neg Hx   ? Stomach cancer Neg Hx   ? Pancreatic cancer Neg Hx   ? ?Social History  ? ?Tobacco Use  ? Smoking status: Former  ?  Packs/day: 0.50  ?  Years: 25.00  ?  Pack years: 12.50  ?  Types: Cigarettes  ? Smokeless tobacco: Never  ?Vaping Use  ? Vaping Use: Never used  ?Substance Use Topics  ? Alcohol use: Not Currently  ? Drug use: Never  ? ?Current Outpatient Medications  ?Medication Sig Dispense Refill  ? buPROPion (WELLBUTRIN XL) 300 MG 24 hr tablet Take 300 mg by mouth daily.    ? escitalopram (LEXAPRO) 20 MG tablet Take 20 mg by mouth daily.    ? hydroxychloroquine (PLAQUENIL) 200 MG tablet Take 200 mg by mouth daily.    ? ibuprofen (ADVIL) 200 MG tablet Take 200 mg by mouth every 6 (six) hours as needed.    ? levothyroxine (SYNTHROID) 50 MCG tablet     ? Multiple Vitamin (MULTIVITAMIN) capsule Take 1 capsule by mouth daily.    ? ondansetron (ZOFRAN ODT) 4 MG disintegrating  tablet Take 1 tablet (4 mg total) by mouth every 8 (eight) hours as needed for nausea or vomiting. 20 tablet 0  ? Probiotic Product (PROBIOTIC-10 PO) Take by mouth daily.    ? traMADol (ULTRAM) 50 MG tablet 1 tablet q8hrs prn pain 30 tablet 1  ? traZODone (DESYREL) 50 MG tablet Take 50 mg by mouth at bedtime as needed.    ? senna (SENOKOT) 8.6 MG TABS tablet Take 1 tablet (8.6 mg total) by mouth daily as needed for mild constipation. (Patient not taking: Reported on 09/22/2021) 120 tablet 0  ? ?No current facility-administered medications for this visit.  ? ?Allergies  ?Allergen Reactions  ? Amoxicillin Nausea And Vomiting  ?  Caused nausea, vomiting and diarrhea  ? ? ? ?Review of Systems: ?All  systems reviewed and negative except where noted in HPI.  ? ? ? ? ?Physical Exam: ?BP 112/68   Pulse 84   Ht 5' 5"  (1.651 m)   Wt 115 lb (52.2 kg)   SpO2 97%   BMI 19.14 kg/m?  ?Constitutional: Pleasant,well-developed, Caucasian female in no acute distress. ?HEENT: Normocephalic and atraumatic. Conjunctivae are normal. No scleral icterus. ?Neck supple.  ?Cardiovascular: Normal rate, regular rhythm.  ?Pulmonary/chest: Effort normal and breath sounds normal. No wheezing, rales or rhonchi. ?Abdominal: Soft, nondistended, multifocal tenderness to light palpation in the left lower quadrant, left upper quadrant and right lower quadrant.  No rigidity or guarding. Bowel sounds active throughout. There are no masses palpable. No hepatomegaly. ?Extremities: no edema ?Neurological: Alert and oriented to person place and time. ?Skin: Skin is warm and dry. No rashes noted. ?Psychiatric: Normal mood and affect. Behavior is normal. ? ?CBC ?   ?Component Value Date/Time  ? WBC 5.2 09/22/2021 1419  ? RBC 3.25 (L) 09/22/2021 1419  ? HGB 10.8 (L) 09/22/2021 1419  ? HCT 31.5 (L) 09/22/2021 1419  ? PLT 280.0 09/22/2021 1419  ? MCV 97.0 09/22/2021 1419  ? MCH 32.6 08/29/2021 0615  ? MCHC 34.4 09/22/2021 1419  ? RDW 13.6 09/22/2021 1419  ? LYMPHSABS 1.3 09/22/2021 1419  ? MONOABS 0.6 09/22/2021 1419  ? EOSABS 0.1 09/22/2021 1419  ? BASOSABS 0.0 09/22/2021 1419  ? ? ?CMP  ?   ?Component Value Date/Time  ? NA 137 09/22/2021 1419  ? K 4.0 09/22/2021 1419  ? CL 101 09/22/2021 1419  ? CO2 30 09/22/2021 1419  ? GLUCOSE 101 (H) 09/22/2021 1419  ? BUN 6 09/22/2021 1419  ? CREATININE 0.52 09/22/2021 1419  ? CALCIUM 9.4 09/22/2021 1419  ? PROT 6.6 09/22/2021 1419  ? ALBUMIN 4.0 09/22/2021 1419  ? AST 33 09/22/2021 1419  ? ALT 23 09/22/2021 1419  ? ALKPHOS 44 09/22/2021 1419  ? BILITOT 0.4 09/22/2021 1419  ? GFRNONAA >60 08/29/2021 0615  ? ?CLINICAL DATA:  Recent abdominal abscess, sepsis. Prior surgery. ?Continued pain on right side. ?   ?EXAM: ?CT ABDOMEN AND PELVIS WITH CONTRAST ?  ?TECHNIQUE: ?Multidetector CT imaging of the abdomen and pelvis was performed ?using the standard protocol following bolus administration of ?intravenous contrast. ?  ?RADIATION DOSE REDUCTION: This exam was performed according to the ?departmental dose-optimization program which includes automated ?exposure control, adjustment of the mA and/or kV according to ?patient size and/or use of iterative reconstruction technique. ?  ?CONTRAST:  52m OMNIPAQUE IOHEXOL 300 MG/ML  SOLN ?  ?COMPARISON:  08/28/2021 ?  ?FINDINGS: ?Lower chest: Bibasilar atelectasis.  No effusions. ?  ?Hepatobiliary: No focal hepatic abnormality. Gallbladder ?unremarkable. ?  ?Pancreas:  No focal abnormality or ductal dilatation. ?  ?Spleen: No focal abnormality.  Normal size. ?  ?Adrenals/Urinary Tract: No adrenal abnormality. No focal renal ?abnormality. No stones or hydronephrosis. Urinary bladder is ?unremarkable. ?  ?Stomach/Bowel: The appendix contains a gas in the proximal aspect ?and tip. Tip also contains fluid and is mildly dilated measuring 13 ?mm. Surrounding inflammation. Appearance is concerning for ?appendicitis. No evidence of bowel obstruction. Stomach, large and ?small bowel grossly unremarkable. ?  ?Vascular/Lymphatic: No evidence of aneurysm or adenopathy. ?  ?Reproductive: Uterus and adnexa unremarkable.  No mass. ?  ?Other: No free fluid or free air. Previously seen right lower ?quadrant/pelvic fluid collections decreased/resolved. No measurable ?fluid collection. ?  ?Musculoskeletal: No acute bony abnormality. ?  ?IMPRESSION: ?Previously seen right lower quadrant/pelvic fluid collections have ?resolved. Appendix appears mildly dilated with surrounding ?inflammation. Findings concerning for appendicitis. ?  ?Bibasilar atelectasis. Previously seen small effusions have ?resolved. ?  ?These results will be called to the ordering clinician or ?representative by the Radiologist  Assistant, and communication ?documented in the PACS or [employer]. ?  ?  ?Electronically Signed ?  By: Rolm Baptise M.D. ?  On: 09/08/2021 07:16 ? ?CLINICAL DATA:  Abdominal pain with shortness of breath. Worsening ?

## 2021-09-30 ENCOUNTER — Other Ambulatory Visit: Payer: Self-pay | Admitting: Orthopedic Surgery

## 2021-09-30 ENCOUNTER — Inpatient Hospital Stay: Admission: RE | Admit: 2021-09-30 | Payer: Medicare Other | Source: Ambulatory Visit

## 2021-09-30 DIAGNOSIS — M25511 Pain in right shoulder: Secondary | ICD-10-CM

## 2021-10-01 ENCOUNTER — Ambulatory Visit
Admission: RE | Admit: 2021-10-01 | Discharge: 2021-10-01 | Disposition: A | Discharge status: Home / Self Care | Payer: Medicare Other | Source: Ambulatory Visit | Attending: Orthopedic Surgery | Admitting: Orthopedic Surgery

## 2021-10-01 DIAGNOSIS — M25511 Pain in right shoulder: Secondary | ICD-10-CM

## 2021-10-01 NOTE — Patient Instructions (Signed)
DUE TO COVID-19 ONLY ONE VISITOR  (aged 59 and older)  IS ALLOWED TO COME WITH YOU AND STAY IN THE WAITING ROOM ONLY DURING PRE OP AND PROCEDURE.   ?**NO VISITORS ARE ALLOWED IN THE SHORT STAY AREA OR RECOVERY ROOM!!** ? ?IF YOU WILL BE ADMITTED INTO THE HOSPITAL YOU ARE ALLOWED ONLY TWO SUPPORT PEOPLE DURING VISITATION HOURS ONLY (7 AM -8PM)   ?The support person(s) must pass our screening, gel in and out, and wear a mask at all times, including in the patient?s room. ?Patients must also wear a mask when staff or their support person are in the room. ?Visitors GUEST BADGE MUST BE WORN VISIBLY  ?One adult visitor may remain with you overnight and MUST be in the room by 8 P.M. ?  ? ? Your procedure is scheduled on: 10/28/21 ? ? Report to [facility] Main Entrance ? ?  Report to Short stay at: 5:15 AM ? ? Call this number if you have problems the morning of surgery [phone] ? ?CLEAR LIQUIDS STARTING THE DAY BEFORE SURGERY until : 4:30 AM DAY OF SURGERY ? ?Water ?Black Coffee (sugar ok, NO MILK/CREAM OR CREAMERS)  ?Tea (sugar ok, NO MILK/CREAM OR CREAMERS) regular and decaf                             ?Plain Jell-O (NO RED)                                           ?Fruit ices (not with fruit pulp, NO RED)                                     ?Popsicles (NO RED)                                                                  ?Juice: apple, WHITE grape, WHITE cranberry ?Sports drinks like Gatorade (NO RED) ?Clear broth(vegetable,chicken,beef) ?           ?DRINK 2 PRESURGERY ENSURE DRINKS THE NIGHT BEFORE SURGERY AT ? 1000 PM AND 1 PRESURGERY DRINK THE DAY OF THE PROCEDURE 3 HOURS PRIOR TO SCHEDULED SURGERY. NO SOLIDS AFTER MIDNIGHT THE DAY PRIOR TO THE SURGERY. NOTHING BY MOUTH EXCEPT CLEAR LIQUIDS UNTIL THREE HOURS PRIOR TO SCHEDULED SURGERY. PLEASE FINISH PRESURGERY ENSURE DRINK PER SURGEON ORDER 3 HOURS PRIOR TO SCHEDULED SURGERY TIME WHICH NEEDS TO BE COMPLETED AT : 4:30 am.    ?  ?The day of surgery:   ?Drink ONE (1) Pre-Surgery Clear Ensure or G2 at AM the morning of surgery. Drink in one sitting. Do not sip.  ?This drink was given to you during your hospital  ?pre-op appointment visit. ?Nothing else to drink after completing the  ?Pre-Surgery Clear Ensure or G2. ?  ?       If you have questions, please contact your surgeon?s office. ? ?FOLLOW BOWEL PREP AND ANY ADDITIONAL PRE OP INSTRUCTIONS YOU RECEIVED FROM YOUR SURGEON'S OFFICE!!! ?  ?Oral Hygiene is also important to reduce  your risk of infection.                                    ?Remember - BRUSH YOUR TEETH THE MORNING OF SURGERY WITH YOUR REGULAR TOOTHPASTE ? ? Do NOT smoke after Midnight ? ? Take these medicines the morning of surgery with A SIP OF WATER: BUPROPION,ESCITALOPRAM,SYNTHROID. ? ?DO NOT TAKE ANY ORAL DIABETIC MEDICATIONS DAY OF YOUR SURGERY ? ?Bring CPAP mask and tubing day of surgery. ?                  ?           You may not have any metal on your body including hair pins, jewelry, and body piercing ? ?           Do not wear make-up, lotions, powders, perfumes/cologne, or deodorant ? ?Do not wear nail polish including gel and S&S, artificial/acrylic nails, or any other type of covering on natural nails including finger and toenails. If you have artificial nails, gel coating, etc. that needs to be removed by a nail salon please have this removed prior to surgery or surgery may need to be canceled/ delayed if the surgeon/ anesthesia feels like they are unable to be safely monitored.  ? ?Do not shave  48 hours prior to surgery. ? ? Do not bring valuables to the hospital. [facility] NOT ?            RESPONSIBLE   FOR VALUABLES. ? ? Contacts, dentures or bridgework may not be worn into surgery. ? ? Bring small overnight bag day of surgery. ?  ? Patients discharged on the day of surgery will not be allowed to drive home.  Someone NEEDS to stay with you for the first 24 hours after anesthesia. ? ? Special Instructions: Bring a copy of your  healthcare power of attorney and living will documents         the day of surgery if you haven't scanned them before. ? ?            Please read over the following fact sheets you were given: IF [place] [phone] ? ?   [facility] - Preparing for Surgery ?Before surgery, you can play an important role.  Because skin is not sterile, your skin needs to be as free of germs as possible.  You can reduce the number of germs on your skin by washing with CHG (chlorahexidine gluconate) soap before surgery.  CHG is an antiseptic cleaner which kills germs and bonds with the skin to continue killing germs even after washing. ?Please DO NOT use if you have an allergy to CHG or antibacterial soaps.  If your skin becomes reddened/irritated stop using the CHG and inform your nurse when you arrive at Short Stay. ?Do not shave (including legs and underarms) for at least 48 hours prior to the first CHG shower.  You may shave your face/neck. ?Please follow these instructions carefully: ? 1.  Shower with CHG Soap the night before surgery and the  morning of Surgery. ? 2.  If you choose to wash your hair, wash your hair first as usual with your  normal  shampoo. ? 3.  After you shampoo, rinse your hair and body thoroughly to remove the  shampoo.  4.  Use CHG as you would any other liquid soap.  You can apply chg directly  to the skin and wash  ?                     Gently with a scrungie or clean washcloth. ? 5.  Apply the CHG Soap to your body ONLY FROM THE NECK DOWN.   Do not use on face/ open      ?                     Wound or open sores. Avoid contact with eyes, ears mouth and genitals (private parts).  ?                     Production manager,  Genitals (private parts) with your normal soap. ?            6.  Wash thoroughly, paying special attention to the area where your surgery  will be performed. ? 7.  Thoroughly rinse your body with warm water from the  neck down. ? 8.  DO NOT shower/wash with your normal soap after using and rinsing off  the CHG Soap. ?               9.  Pat yourself dry with a clean towel. ?           10.  Wear clean pajamas. ?           11.  Place clean sheets on your bed the night of your first shower and do not  sleep with pets. ?Day of Surgery : ?Do not apply any lotions/deodorants the morning of surgery.  Please wear clean clothes to the hospital/surgery center. ? ?FAILURE TO FOLLOW THESE INSTRUCTIONS MAY RESULT IN THE CANCELLATION OF YOUR SURGERY ?PATIENT SIGNATURE_________________________________ ? ?NURSE SIGNATURE__________________________________ ? ?________________________________________________________________________  ?

## 2021-10-04 ENCOUNTER — Telehealth: Payer: Self-pay

## 2021-10-04 ENCOUNTER — Telehealth: Payer: Self-pay | Admitting: Gastroenterology

## 2021-10-04 ENCOUNTER — Encounter (HOSPITAL_COMMUNITY)
Admission: RE | Admit: 2021-10-04 | Discharge: 2021-10-04 | Disposition: A | Discharge status: Home / Self Care | Payer: Medicare Other | Source: Ambulatory Visit | Attending: Anesthesiology | Admitting: Anesthesiology

## 2021-10-04 ENCOUNTER — Ambulatory Visit: Payer: Medicare Other | Admitting: Internal Medicine

## 2021-10-04 ENCOUNTER — Other Ambulatory Visit: Payer: Self-pay

## 2021-10-04 DIAGNOSIS — Z01818 Encounter for other preprocedural examination: Secondary | ICD-10-CM

## 2021-10-04 MED ORDER — PLENVU 140 G PO SOLR: ORAL | 0 refills | Status: DC

## 2021-10-04 NOTE — Telephone Encounter (Signed)
Inbound call from patient stating that she needs to move her colonoscopy up from 4/26 if possible do to having another procedure on the same day later on that day with Dr. Johney Maine. Patient is seeking advice if that is possible. Please advise.   ?

## 2021-10-04 NOTE — Telephone Encounter (Signed)
Disregard message

## 2021-10-04 NOTE — Telephone Encounter (Signed)
Left VM and sent mychart messaged letting patient know there was an opening 10/18/21 for a Colonoscopy. ?

## 2021-10-04 NOTE — Progress Notes (Signed)
Pt. Did not show for her PAT appointment today.Pt. did not answered her phone after several attempts to call her.A message was left on her phone with the RN name and number to call back. ?

## 2021-10-05 ENCOUNTER — Other Ambulatory Visit: Payer: Medicare Other

## 2021-10-06 ENCOUNTER — Encounter: Payer: Self-pay | Admitting: Gastroenterology

## 2021-10-11 ENCOUNTER — Other Ambulatory Visit: Payer: Medicare Other

## 2021-10-11 ENCOUNTER — Telehealth: Payer: Self-pay | Admitting: Gastroenterology

## 2021-10-11 ENCOUNTER — Other Ambulatory Visit: Payer: Self-pay

## 2021-10-11 MED ORDER — CLENPIQ 10-3.5-12 MG-GM -GM/160ML PO SOLN: 320.0000 mL | Freq: Once | ORAL | 0 refills | Status: AC

## 2021-10-11 NOTE — Telephone Encounter (Addendum)
Inbound call from patient stating that she needs a new prep due to Plenvu being too expensive. Seeking advice if that is possible. Please advise. ? ? ?Patient also stated that she is supposed to have another surgery on 4/27 with Dr. Franchot Erichsen and patient is wanting to have a " Urgent" OV with him after her procedure. States that she needs the colon and path results rushed because she is considering canceling surgery with Dr. Franchot Erichsen.  ? ?Please advise.   ?

## 2021-10-12 ENCOUNTER — Inpatient Hospital Stay (HOSPITAL_COMMUNITY): Admission: RE | Admit: 2021-10-12 | Payer: Medicare Other | Source: Ambulatory Visit

## 2021-10-12 ENCOUNTER — Other Ambulatory Visit: Payer: Self-pay

## 2021-10-12 ENCOUNTER — Telehealth: Payer: Self-pay

## 2021-10-12 MED ORDER — GOLYTELY 236 G PO SOLR: 4000.0000 mL | Freq: Once | ORAL | 0 refills | Status: AC

## 2021-10-12 NOTE — Telephone Encounter (Signed)
Patient is calling you back ?

## 2021-10-12 NOTE — Telephone Encounter (Signed)
Left VM for patient to call back regarding prep options, Plenvu $60, Sutab, or Miralax over the counter. ?

## 2021-10-12 NOTE — Telephone Encounter (Signed)
Pt called and states she cannot afford the prep that was called in for her colonoscopy and needs either a sample or golytely. ?

## 2021-10-12 NOTE — Telephone Encounter (Signed)
Pt called and is very anxious. She wanted to know if she could schedule an appt to see Dr. Loletha Grayer next week to discuss colon results prior to possible surgery. Explained there are no appts available but that Dr. Loletha Grayer is very good about calling his patients with results. She stated she would prefer to have a face to face but if she cannot she wanted to know if she could "schedule" a time for a phone call so she would be sure and answer. Please advise. ?

## 2021-10-12 NOTE — Telephone Encounter (Signed)
Patient called stating that the other prep medication is twice as much as plenvu. Per patient, she needs something more affordable. Please advise.  ?

## 2021-10-13 ENCOUNTER — Ambulatory Visit: Admission: RE | Admit: 2021-10-13 | Payer: Medicare Other | Source: Ambulatory Visit

## 2021-10-13 NOTE — Telephone Encounter (Signed)
Left message for pt to call back. ? ?Pt called back and left a message stating she was probably going to cancel her surgery appt. ? ?Left pt a detailed message suggesting she not cancel her surgery until after the colonoscopy and she can speak with Dr. Candis Schatz. Pt advised of Dr. Jacky Kindle recommendations below: ? ?Vaughan Basta,  ?I understand Ms. Allebach is anxious about her surgery.  Please remind her that we will review the findings of the colonoscopy immediately following her colonoscopy and we will have a very good idea of what she has or does not have at that time.  If there are lingering questions remaining in the days after, she can call and leave a message and I will call her back.  ? ?Thanks,  ?Dr. Loletha Grayer  ?

## 2021-10-13 NOTE — Telephone Encounter (Signed)
See additional note, different prep sent to pharmacy. ?

## 2021-10-14 NOTE — Telephone Encounter (Signed)
Pt called back and wanted to let Dr. Candis Schatz know that she is leaning toward cancelling the surgery after the colon is done unless there is something urgent or "left threatening." She knows she needs the surgery but may want to postpone it until she is stronger. Let pt know Dr. Candis Schatz will be notified of her thoughts. ?

## 2021-10-14 NOTE — Telephone Encounter (Signed)
2nd attempt to contact patient to make sure she is able to purchase an affordable prep and make sure she has the correct instructions.Left VM for patient to return call. ?

## 2021-10-15 NOTE — Telephone Encounter (Signed)
This was my third attempt to contact patient regarding prep and instructions. Patient did not answer the phone and I was not able to leave a Voicemail.  ?

## 2021-10-18 ENCOUNTER — Ambulatory Visit (AMBULATORY_SURGERY_CENTER): Payer: Medicare Other | Admitting: Gastroenterology

## 2021-10-18 ENCOUNTER — Encounter: Payer: Self-pay | Admitting: Gastroenterology

## 2021-10-18 VITALS — BP 115/76 | HR 75 | Temp 97.5°F | Resp 15 | Ht 65.0 in | Wt 115.0 lb

## 2021-10-18 DIAGNOSIS — K529 Noninfective gastroenteritis and colitis, unspecified: Secondary | ICD-10-CM

## 2021-10-18 DIAGNOSIS — K640 First degree hemorrhoids: Secondary | ICD-10-CM

## 2021-10-18 DIAGNOSIS — N739 Female pelvic inflammatory disease, unspecified: Secondary | ICD-10-CM

## 2021-10-18 MED ORDER — SODIUM CHLORIDE 0.9 % IV SOLN: 500.0000 mL | Freq: Once | INTRAVENOUS | Status: DC

## 2021-10-18 NOTE — Progress Notes (Signed)
Pt's states no medical or surgical changes since previsit or office visit. VS assessed by C.W ?

## 2021-10-18 NOTE — Op Note (Signed)
[place] ?Patient Name: Deanna Fischer ?Procedure Date: 10/18/2021 3:31 PM ?MRN: [id number] ?Endoscopist: Keygan Dumond E. Candis Schatz , MD ?Age: 61 ?Referring MD:  ?Date of Birth: 03-11-1961 ?Gender: Female ?Account #: [id number] ?Procedure:                Colonoscopy ?Indications:              Abnormal CT of the GI tract, pelvic abscess in  ?                          February ?Medicines:                Monitored Anesthesia Care ?Procedure:                Pre-Anesthesia Assessment: ?                          - Prior to the procedure, a History and Physical  ?                          was performed, and patient medications and  ?                          allergies were reviewed. The patient's tolerance of  ?                          previous anesthesia was also reviewed. The risks  ?                          and benefits of the procedure and the sedation  ?                          options and risks were discussed with the patient.  ?                          All questions were answered, and informed consent  ?                          was obtained. Prior Anticoagulants: The patient has  ?                          taken no previous anticoagulant or antiplatelet  ?                          agents. ASA Grade Assessment: II - A patient with  ?                          mild systemic disease. After reviewing the risks  ?                          and benefits, the patient was deemed in  ?                          satisfactory condition to undergo the procedure. ?  After obtaining informed consent, the colonoscope  ?                          was passed under direct vision. Throughout the  ?                          procedure, the patient's blood pressure, pulse, and  ?                          oxygen saturations were monitored continuously. The  ?                          Olympus PCF-H190DL ([id number]) Colonoscope was  ?                          introduced through the anus and advanced to the the   ?                          terminal ileum, with identification of the  ?                          appendiceal orifice and IC valve. The colonoscopy  ?                          was performed without difficulty. The patient  ?                          tolerated the procedure well. The quality of the  ?                          bowel preparation was good. The terminal ileum,  ?                          ileocecal valve, appendiceal orifice, and rectum  ?                          were photographed. ?Scope In: 3:59:27 PM ?Scope Out: 4:23:24 PM ?Scope Withdrawal Time: 0 hours 16 minutes 34 seconds  ?Total Procedure Duration: 0 hours 23 minutes 57 seconds  ?Findings:                 Hemorrhoids were found on perianal exam. ?                          The digital rectal exam was normal. Pertinent  ?                          negatives include normal sphincter tone and no  ?                          palpable rectal lesions. ?                          There were areas of decreased vascularity and mucus  ?  vs aphthi in the ascending colon and proximal  ?                          transverse colon. These findings were not  ?                          circumferential. No ulcers were seen. Biopsies were  ?                          taken with a cold forceps for histology. Estimated  ?                          blood loss was minimal. ?                          The exam was otherwise normal throughout the  ?                          examined colon. ?                          The terminal ileum appeared normal. ?                          Non-bleeding internal hemorrhoids were found during  ?                          retroflexion. The hemorrhoids were Grade I  ?                          (internal hemorrhoids that do not prolapse). ?                          No additional abnormalities were found on  ?                          retroflexion. ?Complications:            No immediate complications. ?Estimated Blood  Loss:     Estimated blood loss was minimal. ?Impression:               - Hemorrhoids found on perianal exam. ?                          - Vascular-pattern-decreased mucosa in the  ?                          transverse colon and in the ascending colon.  ?                          Biopsied. Although Crohn's colitis is in the  ?                          differential, the mucosal abnormalities were very  ?  mild. ?                          - The examined portion of the ileum was normal. ?                          - Non-bleeding internal hemorrhoids. ?                          - No obvious findings to explain the patient's  ?                          pelvic abscess. No diverticula were identified  ?                          despite a meticulous examination. The mild mucosal  ?                          changes in the right colon could potentially  ?                          represent Crohn's disease, but would not explain an  ?                          abscess unless there were small bowel involvement  ?                          in the setting of a normal terminal ileum. The  ?                          patient's appendix has appeared abnormal on  ?                          previous CTs, and maybe possible be responsible for  ?                          the abscess. ?Recommendation:           - Patient has a contact number available for  ?                          emergencies. The signs and symptoms of potential  ?                          delayed complications were discussed with the  ?                          patient. Return to normal activities tomorrow.  ?                          Written discharge instructions were provided to the  ?                          patient. ?                          -  Resume previous diet. ?                          - Continue present medications. ?                          - Await pathology results. ?                          - Repeat colonoscopy in 10 years for  screening  ?                          purposes. ?                          - If the biopsies show chronic inflammatory changes  ?                          suggestive of Crohn's disease, would recommend MR  ?                          enterography to further assess the small bowel for  ?                          evidence of inflammation/ulceration ?                          - Follow up with Dr. Johney Maine for surgery. ?Shravya Wickwire E. Candis Schatz, MD ?10/18/2021 4:44:19 PM ?This report has been signed electronically. ?

## 2021-10-18 NOTE — Progress Notes (Signed)
Called to room to assist during endoscopic procedure.  Patient ID and intended procedure confirmed with present staff. Received instructions for my participation in the procedure from the performing physician.  

## 2021-10-18 NOTE — Patient Instructions (Signed)
Discharge instructions given. ?Handout on Hemorrhoids. ?Biopsies taken. ?Resume previous medications. ?YOU HAD AN ENDOSCOPIC PROCEDURE TODAY AT [facility]:   Refer to the procedure report that was given to you for any specific questions about what was found during the examination.  If the procedure report does not answer your questions, please call your gastroenterologist to clarify.  If you requested that your care partner not be given the details of your procedure findings, then the procedure report has been included in a sealed envelope for you to review at your convenience later. ? ?YOU SHOULD EXPECT: Some feelings of bloating in the abdomen. Passage of more gas than usual.  Walking can help get rid of the air that was put into your GI tract during the procedure and reduce the bloating. If you had a lower endoscopy (such as a colonoscopy or flexible sigmoidoscopy) you may notice spotting of blood in your stool or on the toilet paper. If you underwent a bowel prep for your procedure, you may not have a normal bowel movement for a few days. ? ?Please Note:  You might notice some irritation and congestion in your nose or some drainage.  This is from the oxygen used during your procedure.  There is no need for concern and it should clear up in a day or so. ? ?SYMPTOMS TO REPORT IMMEDIATELY: ? ?Following lower endoscopy (colonoscopy or flexible sigmoidoscopy): ? Excessive amounts of blood in the stool ? Significant tenderness or worsening of abdominal pains ? Swelling of the abdomen that is new, acute ? Fever of 100?F or higher ? ? ?For urgent or emergent issues, a gastroenterologist can be reached at any hour by calling [phone]. ?Do not use MyChart messaging for urgent concerns.  ? ? ?DIET:  We do recommend a small meal at first, but then you may proceed to your regular diet.  Drink plenty of fluids but you should avoid alcoholic beverages for 24 hours. ? ?ACTIVITY:  You should plan to take  it easy for the rest of today and you should NOT DRIVE or use heavy machinery until tomorrow (because of the sedation medicines used during the test).   ? ?FOLLOW UP: ?Our staff will call the number listed on your records 48-72 hours following your procedure to check on you and address any questions or concerns that you may have regarding the information given to you following your procedure. If we do not reach you, we will leave a message.  We will attempt to reach you two times.  During this call, we will ask if you have developed any symptoms of COVID 19. If you develop any symptoms (ie: fever, flu-like symptoms, shortness of breath, cough etc.) before then, please call [phone].  If you test positive for Covid 19 in the 2 weeks post procedure, please call and report this information to [place].   ? ?If any biopsies were taken you will be contacted by phone or by letter within the next 1-3 weeks.  Please call us at [phone] if you have not heard about the biopsies in 3 weeks.  ? ? ?SIGNATURES/CONFIDENTIALITY: ?You and/or your care partner have signed paperwork which will be entered into your electronic medical record.  These signatures attest to the fact that that the information above on your After Visit Summary has been reviewed and is understood.  Full responsibility of the confidentiality of this discharge information lies with you and/or your care-partner.  ?

## 2021-10-18 NOTE — Progress Notes (Signed)
?  History and Physical Interval Note: ? ?10/18/2021 ?3:38 PM ? ?Deanna Fischer  has presented today for endoscopic procedure(s), with the diagnosis of  ?Encounter Diagnosis  ?Name Primary?  ? Enteritis Yes  ?[name]  The various methods of evaluation and treatment have been discussed with the patient and/or family. After consideration of risks, benefits and other options for treatment, the patient has consented to  the endoscopic procedure(s). ? ? The patient's history has been reviewed, patient examined, no change in status, stable for endoscopic procedure(s).  I have reviewed the patient's chart and labs.  Questions were answered to the patient's satisfaction.   ? ?Patient's abdominal pain and strength are improving since her clinic visit ? ?[name] E. Candis Schatz, MD ?[facility] ? ?

## 2021-10-18 NOTE — Progress Notes (Signed)
PT taken to PACU. Monitors in place. VSS. Report given to RN. 

## 2021-10-19 NOTE — Progress Notes (Addendum)
0935am and LVMAnesthesia Review: ? ?PCP: ANN kULIK- lov 09/22/21  ?Cardiologist : ?Chest x-ray : 08/20/21  AND 04/28/21  ?EKG : 08/15/21  ?Echo : ?Stress test: ?Cardiac Cath :  ?Activity level:  ?Sleep Study/ CPAP : ?Fasting Blood Sugar :      / Checks Blood Sugar -- times a day:   ?Blood Thinner/ Instructions /Last Dose: ?ASA / Instructions/ Last Dose :   ?AT 0905 am on 10/21/2021 pt had not yet arrived for appt.  Called and LVMM ?Called again at 0919am when pt had still not arrived.  Called again at 0935 am and LVMM when pt had not shown for preop appt.   ?

## 2021-10-19 NOTE — Progress Notes (Signed)
DUE TO COVID-19 ONLY  2  VISITOR IS ALLOWED TO COME WITH YOU AND STAY IN THE WAITING ROOM ONLY DURING PRE OP AND PROCEDURE DAY OF SURGERY.   4 VISITOR  MAY VISIT WITH YOU AFTER SURGERY IN YOUR PRIVATE ROOM DURING VISITING HOURS ONLY! ?YOU MAY HAVE ONE PERSON SPEND THE NITE WITH YOU IN YOUR ROOM AFTER SURGERY.   ? ? Your procedure is scheduled on:  ?  10/28/21  ? Report to [facility] Main  Entrance ? ? Report to admitting at     0515            AM ?DO NOT [place], PICTURE ID OR WALLET DAY OF SURGERY.  ?  ? ? Call this number if you have problems the morning of surgery [phone]  ? fOLLOW BOWEL PREP INSTRUCTIONS PER MD.  ?cLEAR LQIUID DIET ON THE DAY OF THE BOWEL PREP.  ? REMEMBER: NO  SOLID FOODS , CANDY, GUM OR MINTS AFTER MIDNITE THE NITE BEFORE SURGERY .    cOMPLLETE 2 ENSURE PRESURGERY ENSURE DRINKS AT 1000 PM THE NITE BEFORE SURGERY      . CLEAR LIQUIDS UNTIL    0430AM            DAY OF SURGERY.      PLEASE FINISH ENSURE DRINK PER SURGEON ORDER  WHICH NEEDS TO BE COMPLETED AT    0430AM       MORNING OF SURGERY.   ? ? ? ? ?CLEAR LIQUID DIET ? ? ?Foods Allowed      ?WATER ?BLACK COFFEE ( SUGAR OK, NO MILK, CREAM OR CREAMER) REGULAR AND DECAF  ?TEA ( SUGAR OK NO MILK, CREAM, OR CREAMER) REGULAR AND DECAF  ?PLAIN JELLO ( NO RED)  ?FRUIT ICES ( NO RED, NO FRUIT PULP)  ?POPSICLES ( NO RED)  ?JUICE- APPLE, WHITE GRAPE AND WHITE CRANBERRY  ?SPORT DRINK LIKE GATORADE ( NO RED)  ?CLEAR BROTH ( VEGETABLE , CHICKEN OR BEEF)                                                               ? ?    ? ?BRUSH YOUR TEETH MORNING OF SURGERY AND RINSE YOUR MOUTH OUT, NO CHEWING GUM CANDY OR MINTS. ?  ? ? Take these medicines the morning of surgery with A SIP OF WATER:  WE;;BUTRIN, SYNTHROID, LEXAPRO ? ? ?DO NOT TAKE ANY DIABETIC MEDICATIONS DAY OF YOUR SURGERY ?                  ?            You may not have any metal on your body including hair pins and  ?            piercings  Do not wear jewelry, make-up,  lotions, powders or perfumes, deodorant ?            Do not wear nail polish on your fingernails.   ?           IF YOU ARE A FEMALE AND WANT TO SHAVE UNDER ARMS OR LEGS PRIOR TO SURGERY YOU MUST DO SO AT LEAST 48 HOURS PRIOR TO SURGERY.  ?  Men may shave face and neck. ? ? Do not bring valuables to the hospital. [facility] NOT ?            RESPONSIBLE   FOR VALUABLES. ? Contacts, dentures or bridgework may not be worn into surgery. ? Leave suitcase in the car. After surgery it may be brought to your room. ? ?  ? Patients discharged the day of surgery will not be allowed to drive home. IF YOU ARE HAVING SURGERY AND GOING HOME THE SAME DAY, YOU MUST HAVE AN ADULT TO DRIVE YOU HOME AND BE WITH YOU FOR 24 HOURS. YOU MAY GO HOME BY TAXI OR UBER OR ORTHERWISE, BUT AN ADULT MUST ACCOMPANY YOU HOME AND STAY WITH YOU FOR 24 HOURS. ?  ? ?            Please read over the following fact sheets you were given: ?_____________________________________________________________________ ? ?[facility] - Preparing for Surgery ?Before surgery, you can play an important role.  Because skin is not sterile, your skin needs to be as free of germs as possible.  You can reduce the number of germs on your skin by washing with CHG (chlorahexidine gluconate) soap before surgery.  CHG is an antiseptic cleaner which kills germs and bonds with the skin to continue killing germs even after washing. ?Please DO NOT use if you have an allergy to CHG or antibacterial soaps.  If your skin becomes reddened/irritated stop using the CHG and inform your nurse when you arrive at Short Stay. ?Do not shave (including legs and underarms) for at least 48 hours prior to the first CHG shower.  You may shave your face/neck. ?Please follow these instructions carefully: ? 1.  Shower with CHG Soap the night before surgery and the  morning of Surgery. ? 2.  If you choose to wash your hair, wash your hair first as usual with your  normal  shampoo. ? 3.   After you shampoo, rinse your hair and body thoroughly to remove the  shampoo.                           4.  Use CHG as you would any other liquid soap.  You can apply chg directly  to the skin and wash  ?                     Gently with a scrungie or clean washcloth. ? 5.  Apply the CHG Soap to your body ONLY FROM THE NECK DOWN.   Do not use on face/ open      ?                     Wound or open sores. Avoid contact with eyes, ears mouth and genitals (private parts).  ?                     Production manager,  Genitals (private parts) with your normal soap. ?            6.  Wash thoroughly, paying special attention to the area where your surgery  will be performed. ? 7.  Thoroughly rinse your body with warm water from the neck down. ? 8.  DO NOT shower/wash with your normal soap after using and rinsing off  the CHG Soap. ?               9.  Pat yourself dry with a clean towel. ?           10.  Wear clean pajamas. ?           11.  Place clean sheets on your bed the night of your first shower and do not  sleep with pets. ?Day of Surgery : ?Do not apply any lotions/deodorants the morning of surgery.  Please wear clean clothes to the hospital/surgery center. ? ?FAILURE TO FOLLOW THESE INSTRUCTIONS MAY RESULT IN THE CANCELLATION OF YOUR SURGERY ?PATIENT SIGNATURE_________________________________ ? ?NURSE SIGNATURE__________________________________ ? ?________________________________________________________________________  ? ? ?           ?

## 2021-10-20 ENCOUNTER — Telehealth: Payer: Self-pay

## 2021-10-20 ENCOUNTER — Inpatient Hospital Stay: Admission: RE | Admit: 2021-10-20 | Payer: Medicare Other | Source: Ambulatory Visit

## 2021-10-20 NOTE — Telephone Encounter (Signed)
?  Follow up Call- ? ? ?  10/18/2021  ?  3:19 PM  ?Call back number  ?Post procedure Call Back phone  # [phone]  ?Permission to leave phone message Yes  ?  ? ?Patient questions: ? ?Do you have a fever, pain , or abdominal swelling? No. ?Pain Score  0 * ? ?Have you tolerated food without any problems? Yes.   ? ?Have you been able to return to your normal activities? Yes.   ? ?Do you have any questions about your discharge instructions: ?Diet   No. ?Medications  No. ?Follow up visit  No. ? ?Do you have questions or concerns about your Care? No. ? ?Actions: ?* If pain score is 4 or above: ?No action needed, pain <4. ? ? ?

## 2021-10-21 ENCOUNTER — Encounter (HOSPITAL_COMMUNITY)
Admission: RE | Admit: 2021-10-21 | Discharge: 2021-10-21 | Disposition: A | Discharge status: Home / Self Care | Payer: Medicare Other | Source: Ambulatory Visit | Attending: Anesthesiology | Admitting: Anesthesiology

## 2021-10-21 NOTE — Progress Notes (Signed)
Deanna Fischer,  ?Good news, the biopsies of your colon did not show any histologic evidence of Crohn's disease.  I would recommend you proceed with surgery with Dr. Johney Maine to remove your appendix.  I am not recommending any further evaluation for Crohn's disease at this point given the normal biopsies and improvement in your symptoms.  If Dr. Johney Maine sees something intraoperatively that may be suggestive of Crohn's, then we can pursue further evaluation after that. ?I tried giving you a call today, but if you have further questions please give our office a call back.

## 2021-10-22 ENCOUNTER — Encounter (HOSPITAL_COMMUNITY): Payer: Self-pay

## 2021-10-27 ENCOUNTER — Encounter: Payer: Medicare Other | Admitting: Gastroenterology

## 2021-10-28 ENCOUNTER — Inpatient Hospital Stay: Admit: 2021-10-28 | Payer: Medicare Other | Admitting: Surgery

## 2021-10-28 SURGERY — COLECTOMY, SIGMOID, ROBOT-ASSISTED
Anesthesia: General

## 2021-11-01 ENCOUNTER — Other Ambulatory Visit: Payer: Medicare Other

## 2021-11-03 ENCOUNTER — Other Ambulatory Visit: Payer: Medicare Other

## 2021-11-05 ENCOUNTER — Ambulatory Visit: Payer: Medicare Other | Admitting: Family Medicine

## 2021-11-09 ENCOUNTER — Encounter: Payer: Medicare Other | Admitting: Gastroenterology

## 2021-11-10 ENCOUNTER — Other Ambulatory Visit: Payer: Medicare Other

## 2021-11-11 ENCOUNTER — Telehealth: Payer: Self-pay | Admitting: Family Medicine

## 2021-11-11 NOTE — Telephone Encounter (Signed)
Copied from [place] [id number]. Topic: Medicare AWV ?>> Nov 11, 2021 10:34 AM Harris-Coley, Hannah Beat wrote: ?Reason for CRM: Left message for patient to schedule Annual Wellness Visit.  Please schedule with Nurse Health Advisor Charlott Rakes, RN at [facility]  Please call [phone] ask for Juliann Pulse ?

## 2021-11-16 ENCOUNTER — Ambulatory Visit (INDEPENDENT_AMBULATORY_CARE_PROVIDER_SITE_OTHER): Payer: Medicare Other

## 2021-11-16 DIAGNOSIS — Z0000 Encounter for general adult medical examination without abnormal findings: Secondary | ICD-10-CM

## 2021-11-16 NOTE — Patient Instructions (Signed)
Deanna Fischer , ?Thank you for taking time to come for your Medicare Wellness Visit. I appreciate your ongoing commitment to your health goals. Please review the following plan we discussed and let me know if I can assist you in the future.  ? ?Screening recommendations/referrals: ?Colonoscopy: Done 10/18/21 repeat every 10 years  ?Mammogram: Done 07/15/21 repeat every year  ?Recommended yearly ophthalmology/optometry visit for glaucoma screening and checkup ?Recommended yearly dental visit for hygiene and checkup ? ?Vaccinations: ?Influenza vaccine: Done 06/10/21 repeat every year  ?Tdap vaccine: Done 02/15/21 repeat every 10 years  ?Shingles vaccine: Shingrix discussed. Please contact your pharmacy for coverage information.   ?Covid-19: completed 3/1, 10/01/19, 08/08/20, 07/05/21 ? ?Advanced directives: Advance directive discussed with you today. I have provided a copy for you to complete at home and have notarized. Once this is complete please bring a copy in to our office so we can scan it into your chart. ? ?Conditions/risks identified: walk more after surgery  ? ?Next appointment: Follow up in one year for your annual wellness visit.  ? ?Preventive Care 40-64 Years, Female ?Preventive care refers to lifestyle choices and visits with your health care provider that can promote health and wellness. ?What does preventive care include? ?A yearly physical exam. This is also called an annual well check. ?Dental exams once or twice a year. ?Routine eye exams. Ask your health care provider how often you should have your eyes checked. ?Personal lifestyle choices, including: ?Daily care of your teeth and gums. ?Regular physical activity. ?Eating a healthy diet. ?Avoiding tobacco and drug use. ?Limiting alcohol use. ?Practicing safe sex. ?Taking low-dose aspirin daily starting at age 18. ?Taking vitamin and mineral supplements as recommended by your health care provider. ?What happens during an annual well check? ?The services and  screenings done by your health care provider during your annual well check will depend on your age, overall health, lifestyle risk factors, and family history of disease. ?Counseling  ?Your health care provider may ask you questions about your: ?Alcohol use. ?Tobacco use. ?Drug use. ?Emotional well-being. ?Home and relationship well-being. ?Sexual activity. ?Eating habits. ?Work and work Statistician. ?Method of birth control. ?Menstrual cycle. ?Pregnancy history. ?Screening  ?You may have the following tests or measurements: ?Height, weight, and BMI. ?Blood pressure. ?Lipid and cholesterol levels. These may be checked every 5 years, or more frequently if you are over 62 years old. ?Skin check. ?Lung cancer screening. You may have this screening every year starting at age 27 if you have a 30-pack-year history of smoking and currently smoke or have quit within the past 15 years. ?Fecal occult blood test (FOBT) of the stool. You may have this test every year starting at age 3. ?Flexible sigmoidoscopy or colonoscopy. You may have a sigmoidoscopy every 5 years or a colonoscopy every 10 years starting at age 66. ?Hepatitis C blood test. ?Hepatitis B blood test. ?Sexually transmitted disease (STD) testing. ?Diabetes screening. This is done by checking your blood sugar (glucose) after you have not eaten for a while (fasting). You may have this done every 1-3 years. ?Mammogram. This may be done every 1-2 years. Talk to your health care provider about when you should start having regular mammograms. This may depend on whether you have a family history of breast cancer. ?BRCA-related cancer screening. This may be done if you have a family history of breast, ovarian, tubal, or peritoneal cancers. ?Pelvic exam and Pap test. This may be done every 3 years starting at age 40. Starting  at age 13, this may be done every 5 years if you have a Pap test in combination with an HPV test. ?Bone density scan. This is done to screen for  osteoporosis. You may have this scan if you are at high risk for osteoporosis. ?Discuss your test results, treatment options, and if necessary, the need for more tests with your health care provider. ?Vaccines  ?Your health care provider may recommend certain vaccines, such as: ?Influenza vaccine. This is recommended every year. ?Tetanus, diphtheria, and acellular pertussis (Tdap, Td) vaccine. You may need a Td booster every 10 years. ?Zoster vaccine. You may need this after age 2. ?Pneumococcal 13-valent conjugate (PCV13) vaccine. You may need this if you have certain conditions and were not previously vaccinated. ?Pneumococcal polysaccharide (PPSV23) vaccine. You may need one or two doses if you smoke cigarettes or if you have certain conditions. ?Talk to your health care provider about which screenings and vaccines you need and how often you need them. ?This information is not intended to replace advice given to you by your health care provider. Make sure you discuss any questions you have with your health care provider. ?Document Released: 07/17/2015 Document Revised: 03/09/2016 Document Reviewed: 04/21/2015 ?Elsevier Interactive Patient Education ? 2017 [employer] ? ? ? ?Fall Prevention in the Home ?Falls can cause injuries. They can happen to people of all ages. There are many things you can do to make your home safe and to help prevent falls. ?What can I do on the outside of my home? ?Regularly fix the edges of walkways and driveways and fix any cracks. ?Remove anything that might make you trip as you walk through a door, such as a raised step or threshold. ?Trim any bushes or trees on the path to your home. ?Use bright outdoor lighting. ?Clear any walking paths of anything that might make someone trip, such as rocks or tools. ?Regularly check to see if handrails are loose or broken. Make sure that both sides of any steps have handrails. ?Any raised decks and porches should have guardrails on the  edges. ?Have any leaves, snow, or ice cleared regularly. ?Use sand or salt on walking paths during winter. ?Clean up any spills in your garage right away. This includes oil or grease spills. ?What can I do in the bathroom? ?Use night lights. ?Install grab bars by the toilet and in the tub and shower. Do not use towel bars as grab bars. ?Use non-skid mats or decals in the tub or shower. ?If you need to sit down in the shower, use a plastic, non-slip stool. ?Keep the floor dry. Clean up any water that spills on the floor as soon as it happens. ?Remove soap buildup in the tub or shower regularly. ?Attach bath mats securely with double-sided non-slip rug tape. ?Do not have throw rugs and other things on the floor that can make you trip. ?What can I do in the bedroom? ?Use night lights. ?Make sure that you have a light by your bed that is easy to reach. ?Do not use any sheets or blankets that are too big for your bed. They should not hang down onto the floor. ?Have a firm chair that has side arms. You can use this for support while you get dressed. ?Do not have throw rugs and other things on the floor that can make you trip. ?What can I do in the kitchen? ?Clean up any spills right away. ?Avoid walking on wet floors. ?Keep items that you use a  lot in easy-to-reach places. ?If you need to reach something above you, use a strong step stool that has a grab bar. ?Keep electrical cords out of the way. ?Do not use floor polish or wax that makes floors slippery. If you must use wax, use non-skid floor wax. ?Do not have throw rugs and other things on the floor that can make you trip. ?What can I do with my stairs? ?Do not leave any items on the stairs. ?Make sure that there are handrails on both sides of the stairs and use them. Fix handrails that are broken or loose. Make sure that handrails are as long as the stairways. ?Check any carpeting to make sure that it is firmly attached to the stairs. Fix any carpet that is loose or  worn. ?Avoid having throw rugs at the top or bottom of the stairs. If you do have throw rugs, attach them to the floor with carpet tape. ?Make sure that you have a light switch at the top of the stairs and the b

## 2021-11-16 NOTE — Progress Notes (Addendum)
Virtual Visit via Telephone Note ? ?I connected with  Deanna Fischer on 11/16/21 at 10:30 AM EDT by telephone and verified that I am speaking with the correct person using two identifiers. ? ?Medicare Annual Wellness visit completed telephonically due to Covid-19 pandemic.  ? ?Persons participating in this call: This Health Coach and this patient.  ? ?Location: ?Patient: home ?Provider: office ?  ?I discussed the limitations, risks, security and privacy concerns of performing an evaluation and management service by telephone and the availability of in person appointments. The patient expressed understanding and agreed to proceed. ? ?Unable to perform video visit due to video visit attempted and failed and/or patient does not have video capability.  ? ?Some vital signs may be absent or patient reported.  ? ?Willette Brace, LPN ? ? ?Subjective:  ? Deanna Fischer is a 61 y.o. female who presents for an Initial Medicare Annual Wellness Visit. ? ?Review of Systems    ? ?  ? ?   ?Objective:  ?  ?There were no vitals filed for this visit. ?There is no height or weight on file to calculate BMI. ? ? ?  11/16/2021  ? 10:41 AM 08/17/2021  ? 12:00 PM 08/16/2021  ?  3:41 PM 02/15/2021  ?  9:12 PM  ?Advanced Directives  ?Does Patient Have a Medical Advance Directive? No No No No  ?Would patient like information on creating a medical advance directive? Yes (MAU/Ambulatory/Procedural Areas - Information given) No - Patient declined No - Patient declined   ? ? ?Current Medications (verified) ?Outpatient Encounter Medications as of 11/16/2021  ?Medication Sig  ? buPROPion (WELLBUTRIN XL) 300 MG 24 hr tablet Take 300 mg by mouth daily.  ? escitalopram (LEXAPRO) 20 MG tablet Take 20 mg by mouth daily.  ? hydroxychloroquine (PLAQUENIL) 200 MG tablet Take 200 mg by mouth daily.  ? ibuprofen (ADVIL) 200 MG tablet Take 200 mg by mouth every 6 (six) hours as needed.  ? levothyroxine (SYNTHROID) 50 MCG tablet   ? Multiple Vitamin (MULTIVITAMIN)  capsule Take 1 capsule by mouth daily.  ? ondansetron (ZOFRAN ODT) 4 MG disintegrating tablet Take 1 tablet (4 mg total) by mouth every 8 (eight) hours as needed for nausea or vomiting.  ? Oyster Shell Calcium 500 MG TABS 1 tablet with meals  ? Probiotic Product (PROBIOTIC-10 PO) Take by mouth daily.  ? traZODone (DESYREL) 50 MG tablet Take 50 mg by mouth at bedtime as needed.  ? [DISCONTINUED] senna (SENOKOT) 8.6 MG TABS tablet Take 1 tablet (8.6 mg total) by mouth daily as needed for mild constipation. (Patient not taking: Reported on 09/22/2021)  ? [DISCONTINUED] traMADol (ULTRAM) 50 MG tablet 1 tablet q8hrs prn pain (Patient not taking: Reported on 10/18/2021)  ? ?No facility-administered encounter medications on file as of 11/16/2021.  ? ? ?Allergies (verified) ?Amoxicillin  ? ?History: ?Past Medical History:  ?Diagnosis Date  ? Allergy   ? Anemia   ? Claustrophobia 08/17/2021  ? Depression with anxiety 08/17/2021  ? Dyslipidemia 08/17/2021  ? 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 Hyperlipidemia   ? Hypothyroidism 08/17/2021  ? Lupus ([place])   ? PTSD (post-traumatic stress disorder) 04/24/2020  ? Systemic lupus erythematosus ([place]) 08/17/2021  ? ?History reviewed. No pertinent surgical history. ?Family History  ?Problem Relation Age of Onset  ? Depression Mother   ? Cancer Mother   ? Arthritis Mother   ? Breast cancer Mother   ? COPD Mother   ? Heart disease Father   ? Hyperlipidemia  Father   ? Alcohol abuse Father   ? Depression Father   ? Coronary artery disease Father   ? Arrhythmia Father   ? Heart attack Father   ? Breast cancer Sister   ? Drug abuse Sister   ? Colon cancer Neg Hx   ? Esophageal cancer Neg Hx   ? Stomach cancer Neg Hx   ? Pancreatic cancer Neg Hx   ? ?Social History  ? ?Socioeconomic History  ? Marital status: Single  ?  Spouse name: Not on file  ? Number of children: Not on file  ? Years of education: Not on file  ? Highest education level: Not on file  ?Occupational History  ? Not on file  ?Tobacco Use   ? Smoking status: Former  ?  Packs/day: 0.50  ?  Years: 25.00  ?  Pack years: 12.50  ?  Types: Cigarettes  ? Smokeless tobacco: Never  ?Vaping Use  ? Vaping Use: Never used  ?Substance and Sexual Activity  ? Alcohol use: Not Currently  ? Drug use: Never  ? Sexual activity: Not Currently  ?Other Topics Concern  ? Not on file  ?Social History Narrative  ? Not on file  ? ?Social Determinants of Health  ? ?Financial Resource Strain: Low Risk   ? Difficulty of Paying Living Expenses: Not hard at all  ?Food Insecurity: No Food Insecurity  ? Worried About Charity fundraiser in the Last Year: Never true  ? Ran Out of Food in the Last Year: Never true  ?Transportation Needs: No Transportation Needs  ? Lack of Transportation (Medical): No  ? Lack of Transportation (Non-Medical): No  ?Physical Activity: Inactive  ? Days of Exercise per Week: 0 days  ? Minutes of Exercise per Session: 0 min  ?Stress: Stress Concern Present  ? Feeling of Stress : To some extent  ?Social Connections: Moderately Integrated  ? Frequency of Communication with Friends and Family: More than three times a week  ? Frequency of Social Gatherings with Friends and Family: Once a week  ? Attends Religious Services: More than 4 times per year  ? Active Member of Clubs or Organizations: Yes  ? Attends Archivist Meetings: 1 to 4 times per year  ? Marital Status: Never married  ? ? ?Tobacco Counseling ?Counseling given: Not Answered ? ? ?Clinical Intake: ? ?Pre-visit preparation completed: Yes ? ?Pain : No/denies pain ? ?  ? ?BMI - recorded: 19.14 ?Nutritional Status: BMI of 19-24  Normal ?Nutritional Risks: None ?Diabetes: No ? ?How often do you need to have someone help you when you read instructions, pamphlets, or other written materials from your doctor or pharmacy?: 1 - Never ? ?Diabetic?no ? ?Interpreter Needed?: No ? ?Information entered by :: Charlott Rakes, LPN ? ? ?Activities of Daily Living ? ?  11/16/2021  ? 10:43 AM 08/17/2021  ? 12:00  PM  ?In your present state of health, do you have any difficulty performing the following activities:  ?Hearing? 0 0  ?Vision? 0 0  ?Difficulty concentrating or making decisions? 0 0  ?Walking or climbing stairs? 0 1  ?Dressing or bathing? 0 0  ?Doing errands, shopping? 0 0  ?Preparing Food and eating ? N   ?Using the Toilet? N   ?In the past six months, have you accidently leaked urine? N   ?Do you have problems with loss of bowel control? N   ?Managing your Medications? N   ?Managing your Finances? N   ?  Housekeeping or managing your Housekeeping? N   ? ? ?Patient Care Team: ?Tawnya Crook, MD as PCP - General (Family Medicine) ?Michael Boston, MD as Consulting Physician (General Surgery) ?Daryel November, MD as Consulting Physician (Gastroenterology) ? ?Indicate any recent Medical Services you may have received from other than [facility] providers in the past year (date may be approximate). ? ?   ?Assessment:  ? This is a routine wellness examination for Brenisha. ? ?Hearing/Vision screen ?Hearing Screening - Comments:: Pt denies any hearing issues ?Vision Screening - Comments:: Pt follows up with [facility] opthalmology for annual eye exams  ? ?Dietary issues and exercise activities discussed: ?Current Exercise Habits: The patient does not participate in regular exercise at present ? ? Goals Addressed   ? ?  ?  ?  ?  ? This Visit's Progress  ?  Patient Stated     ?  Start walking 5 days a week after surgery  ?  ? ?  ? ?Depression Screen ? ?  11/16/2021  ? 10:37 AM 09/22/2021  ?  1:23 PM 09/02/2021  ? 10:33 AM 09/18/2018  ? 10:52 AM 08/31/2018  ?  2:13 PM  ?PHQ 2/9 Scores  ?PHQ - 2 Score 4      ?PHQ- 9 Score 4      ?Exception Documentation  Patient refusal Patient refusal    ?  ? Information is confidential and restricted. Go to Review Flowsheets to unlock data.  ?  ?Fall Risk ? ?  11/16/2021  ? 10:42 AM 09/22/2021  ?  1:22 PM 09/02/2021  ? 10:04 AM  ?Fall Risk   ?Falls in the past year? 1 1 1   ?Number falls in past yr: 1 1 1    ?Injury with Fall? 1 1 1   ?Comment fell over dog    ?Risk for fall due to : Impaired vision    ?Follow up Falls prevention discussed Falls evaluation completed;Education provided;Falls prevention discu

## 2021-11-18 ENCOUNTER — Ambulatory Visit: Payer: Medicare Other | Admitting: Family Medicine

## 2021-11-30 ENCOUNTER — Telehealth: Payer: Self-pay | Admitting: Family Medicine

## 2021-11-30 NOTE — Telephone Encounter (Signed)
Pt is stating she is having financial issues. She is requesting to have a bone density asap. She has had a broken shoulder and a broken foot. She is requesting to make sure the abdomen ultrasound includes the pancreas and appendix. She is due to have surgery in July. She is asking to speak with Dr Cherlynn Kaiser by phone when she may have time. Please advise.

## 2021-11-30 NOTE — Telephone Encounter (Signed)
Please see note below. 

## 2021-12-01 ENCOUNTER — Ambulatory Visit: Payer: Medicare Other | Admitting: Family Medicine

## 2021-12-01 NOTE — Telephone Encounter (Signed)
Please see note below. 

## 2021-12-01 NOTE — Telephone Encounter (Signed)
Pt called on 05/31, LVM.

## 2021-12-02 ENCOUNTER — Ambulatory Visit
Admission: RE | Admit: 2021-12-02 | Discharge: 2021-12-02 | Disposition: A | Discharge status: Home / Self Care | Payer: Medicare Other | Source: Ambulatory Visit | Attending: Family Medicine | Admitting: Family Medicine

## 2021-12-02 DIAGNOSIS — R1084 Generalized abdominal pain: Secondary | ICD-10-CM

## 2021-12-06 ENCOUNTER — Ambulatory Visit: Payer: Medicare Other | Admitting: Family Medicine

## 2021-12-08 ENCOUNTER — Ambulatory Visit: Payer: Medicare Other | Admitting: Family Medicine

## 2021-12-08 ENCOUNTER — Other Ambulatory Visit: Payer: Self-pay | Admitting: Family Medicine

## 2021-12-16 ENCOUNTER — Other Ambulatory Visit: Payer: Self-pay | Admitting: Surgery

## 2021-12-16 ENCOUNTER — Ambulatory Visit (HOSPITAL_COMMUNITY): Admission: RE | Admit: 2021-12-16 | Payer: Medicare Other | Source: Ambulatory Visit

## 2021-12-16 ENCOUNTER — Other Ambulatory Visit (HOSPITAL_COMMUNITY): Payer: Self-pay | Admitting: Surgery

## 2021-12-16 ENCOUNTER — Encounter (HOSPITAL_COMMUNITY): Payer: Self-pay

## 2021-12-16 DIAGNOSIS — N739 Female pelvic inflammatory disease, unspecified: Secondary | ICD-10-CM

## 2021-12-17 ENCOUNTER — Ambulatory Visit (HOSPITAL_COMMUNITY)
Admission: RE | Admit: 2021-12-17 | Discharge: 2021-12-17 | Disposition: A | Discharge status: Home / Self Care | Payer: Medicare Other | Source: Ambulatory Visit | Attending: Surgery | Admitting: Surgery

## 2021-12-17 ENCOUNTER — Ambulatory Visit (HOSPITAL_COMMUNITY): Payer: Medicare Other

## 2021-12-17 ENCOUNTER — Encounter (HOSPITAL_COMMUNITY): Payer: Self-pay

## 2021-12-17 DIAGNOSIS — N739 Female pelvic inflammatory disease, unspecified: Secondary | ICD-10-CM | POA: Diagnosis present

## 2021-12-17 MED ORDER — IOHEXOL 300 MG/ML  SOLN
100.0000 mL | Freq: Once | INTRAMUSCULAR | Status: AC | PRN
  Administered 2021-12-17: 100 mL via INTRAVENOUS

## 2021-12-17 MED ORDER — IOHEXOL 9 MG/ML PO SOLN
ORAL | Status: AC
  Filled 2021-12-17: qty 1000

## 2021-12-17 MED ORDER — SODIUM CHLORIDE (PF) 0.9 % IJ SOLN
INTRAMUSCULAR | Status: AC
  Filled 2021-12-17: qty 50

## 2021-12-22 NOTE — Patient Instructions (Addendum)
DUE TO SPACE LIMITATIONS, ONLY TWO VISITORS  (aged 59 and older) ARE ALLOWED TO COME WITH YOU AND STAY IN THE WAITING ROOM DURING YOUR PRE OP AND PROCEDURE.   **NO VISITORS ARE ALLOWED IN THE SHORT STAY AREA OR RECOVERY ROOM!!**  IF YOU WILL BE ADMITTED INTO THE HOSPITAL YOU ARE ALLOWED ONLY FOUR SUPPORT PEOPLE DURING VISITATION HOURS (7 AM -8PM)   The support person(s) must pass our screening, and use Hand sanitizing gel. Visitors GUEST BADGE MUST BE WORN VISIBLY  One adult visitor may remain with you overnight and MUST be in the room by 8 P.M.   You are not required to quarantine at this time prior to your surgery. However, you must do this: Hand Hygiene often Do NOT share personal items Notify your provider if you are in close contact with someone who has COVID or you develop fever 100.4 or greater, new onset of sneezing, cough, sore throat, shortness of breath or body aches.       Your procedure is scheduled on:  January 05, 2022  Report to [facility] Main Entrance.  Report to admitting at:  09:45  AM  +++++Call this number if you have any questions or problems the morning of surgery [phone]  Follow a clear liquid diet day of prep to avoid dehydration  You may  have the following liquids until 09:00 AM DAY OF SURGERY  Clear Liquid Diet Water Black Coffee (sugar ok, NO MILK/CREAM OR CREAMERS)  Tea (sugar ok, NO MILK/CREAM OR CREAMERS) regular and decaf                             Plain Jell-O (NO RED)                                           Fruit ices (not with fruit pulp, NO RED)                                     Popsicles (NO RED)                                                                  Juice: apple, WHITE grape, WHITE cranberry Sports drinks like Gatorade (NO RED) Clear broth(vegetable,chicken,beef)              The night before surgery: Drink two (2) Pre-Surgery Clear Ensure before Midnight.        The day of surgery:  Drink ONE (1) Pre-Surgery  Clear Ensure at  09:00 AM the morning of surgery. Drink in one sitting. Do not sip.  This drink was given to you during your hospital  pre-op appointment visit. Nothing else to drink after completing the Pre-Surgery Clear Ensure  If you have questions, please contact your surgeon's office.   FOLLOW BOWEL PREP AND ANY ADDITIONAL PRE OP INSTRUCTIONS YOU RECEIVED FROM YOUR SURGEON'S OFFICE!!!  BOWEL PREP:   Dulcolax 20 mg - Take one (1) tablet with water the day prior to surgery.  Miralax 255 g -  Mix with 64 oz Gatorade/Powerade.  Drink gradually over the next few hours (8 oz glass every 15-30 minutes) until gone the day prior to surgery.    Neomycin 1000 mg - At 2 pm, 3 pm and 10 pm after Miralax  bowel prep the day prior to surgery.  Metronidazole 1000 mg - At 2 pm, 3 pm and 10 pm after Miralax bowel prep the day prior to surgery.     Oral Hygiene is also important to reduce your risk of infection.        Remember - BRUSH YOUR TEETH THE MORNING OF SURGERY WITH YOUR REGULAR TOOTHPASTE   Take ONLY these medicines the morning of surgery with A SIP OF WATER: Wellbutrin, Lexapro, Synthroid, and Zofran as needed.                 You may not have any metal on your body including hair pins, jewelry, and body piercing  Do not wear make-up, lotions, powders, perfumes or deodorant  Do not wear nail polish including gel and S&S, artificial / acrylic nails, or any other type of covering on natural nails including finger and toenails. If you have artificial nails, gel coating, etc., that needs to be removed by a nail salon, Please have this removed prior to surgery. Not doing so may mean that your surgery could be cancelled or delayed if the Surgeon or anesthesia staff feels like they are unable to monitor you safely.   Do not shave 48 hours prior to surgery to avoid nicks in your skin which may contribute to postoperative infections.   Contacts, Hearing Aids, dentures or bridgework may not be worn  into surgery.   You may bring a small overnight bag with you on the day of surgery, only pack items that are not valuable .[facility] IS NOT RESPONSIBLE   FOR VALUABLES THAT ARE LOST OR STOLEN.   DO NOT [place]. PHARMACY WILL DISPENSE MEDICATIONS LISTED ON YOUR MEDICATION LIST TO YOU DURING YOUR ADMISSION [facility]!   Special Instructions: Bring a copy of your healthcare power of attorney and living will documents the day of surgery, if you wish to have them scanned into your [facility] Records- EPIC  Please read over the following fact sheets you were given: IF YOU HAVE QUESTIONS ABOUT YOUR PRE-OP INSTRUCTIONS, PLEASE CALL [phone]  ([place])   [facility] - Preparing for Surgery Before surgery, you can play an important role.  Because skin is not sterile, your skin needs to be as free of germs as possible.  You can reduce the number of germs on your skin by washing with CHG (chlorahexidine gluconate) soap before surgery.  CHG is an antiseptic cleaner which kills germs and bonds with the skin to continue killing germs even after washing. Please DO NOT use if you have an allergy to CHG or antibacterial soaps.  If your skin becomes reddened/irritated stop using the CHG and inform your nurse when you arrive at Short Stay. Do not shave (including legs and underarms) for at least 48 hours prior to the first CHG shower.  You may shave your face/neck.  Please follow these instructions carefully:  1.  Shower with CHG Soap the night before surgery and the  morning of surgery.  2.  If you choose to wash your hair, wash your hair first as usual with your normal  shampoo.  3.  After you shampoo, rinse your hair and body thoroughly to  remove the shampoo.                             4.  Use CHG as you would any other liquid soap.  You can apply chg directly to the skin and wash.  Gently with a scrungie or clean washcloth.  5.  Apply the CHG Soap to your  body ONLY FROM THE NECK DOWN.   Do not use on face/ open                           Wound or open sores. Avoid contact with eyes, ears mouth and genitals (private parts).                       Wash face,  Genitals (private parts) with your normal soap.             6.  Wash thoroughly, paying special attention to the area where your  surgery  will be performed.  7.  Thoroughly rinse your body with warm water from the neck down.  8.  DO NOT shower/wash with your normal soap after using and rinsing off the CHG Soap.            9.  Pat yourself dry with a clean towel.            10.  Wear clean pajamas.            11.  Place clean sheets on your bed the night of your first shower and do not  sleep with pets.  ON THE DAY OF SURGERY : Do not apply any lotions/deodorants the morning of surgery.  Please wear clean clothes to the hospital/surgery center.    FAILURE TO FOLLOW THESE INSTRUCTIONS MAY RESULT IN THE CANCELLATION OF YOUR SURGERY  PATIENT SIGNATURE_________________________________  NURSE SIGNATURE__________________________________  ________________________________________________________________________     Deanna Fischer    An incentive spirometer is a tool that can help keep your lungs clear and active. This tool measures how well you are filling your lungs with each breath. Taking long deep breaths may help reverse or decrease the chance of developing breathing (pulmonary) problems (especially infection) following: A long period of time when you are unable to move or be active. BEFORE THE PROCEDURE  If the spirometer includes an indicator to show your best effort, your nurse or respiratory therapist will set it to a desired goal. If possible, sit up straight or lean slightly forward. Try not to slouch. Hold the incentive spirometer in an upright position. INSTRUCTIONS FOR USE  Sit on the edge of your bed if possible, or sit up as far as you can in bed or on a chair. Hold  the incentive spirometer in an upright position. Breathe out normally. Place the mouthpiece in your mouth and seal your lips tightly around it. Breathe in slowly and as deeply as possible, raising the piston or the ball toward the top of the column. Hold your breath for 3-5 seconds or for as long as possible. Allow the piston or ball to fall to the bottom of the column. Remove the mouthpiece from your mouth and breathe out normally. Rest for a few seconds and repeat Steps 1 through 7 at least 10 times every 1-2 hours when you are awake. Take your time and take a few normal breaths between deep breaths. The spirometer may include an  indicator to show your best effort. Use the indicator as a goal to work toward during each repetition. After each set of 10 deep breaths, practice coughing to be sure your lungs are clear. If you have an incision (the cut made at the time of surgery), support your incision when coughing by placing a pillow or rolled up towels firmly against it. Once you are able to get out of bed, walk around indoors and cough well. You may stop using the incentive spirometer when instructed by your caregiver.  RISKS AND COMPLICATIONS Take your time so you do not get dizzy or light-headed. If you are in pain, you may need to take or ask for pain medication before doing incentive spirometry. It is harder to take a deep breath if you are having pain. AFTER USE Rest and breathe slowly and easily. It can be helpful to keep track of a log of your progress. Your caregiver can provide you with a simple table to help with this. If you are using the spirometer at home, follow these instructions: [place] IF:  You are having difficultly using the spirometer. You have trouble using the spirometer as often as instructed. Your pain medication is not giving enough relief while using the spirometer. You develop fever of 100.5 F (38.1 C) or higher.                                                                                                     SEEK IMMEDIATE MEDICAL CARE IF:  You cough up bloody sputum that had not been present before. You develop fever of 102 F (38.9 C) or greater. You develop worsening pain at or near the incision site. MAKE SURE YOU:  Understand these instructions. Will watch your condition. Will get help right away if you are not doing well or get worse. Document Released: 10/31/2006 Document Revised: 09/12/2011 Document Reviewed: 01/01/2007 [facility] Patient Information 2014 [place], [place].    WHAT IS A BLOOD TRANSFUSION? Blood Transfusion Information  A transfusion is the replacement of blood or some of its parts. Blood is made up of multiple cells which provide different functions. Red blood cells carry oxygen and are used for blood loss replacement. White blood cells fight against infection. Platelets control bleeding. Plasma helps clot blood. Other blood products are available for specialized needs, such as hemophilia or other clotting disorders. BEFORE THE TRANSFUSION  Who gives blood for transfusions?  Healthy volunteers who are fully evaluated to make sure their blood is safe. This is blood bank blood. Transfusion therapy is the safest it has ever been in the practice of medicine. Before blood is taken from a donor, a complete history is taken to make sure that person has no history of diseases nor engages in risky social behavior (examples are intravenous drug use or sexual activity with multiple partners). The donor's travel history is screened to minimize risk of transmitting infections, such as malaria. The donated blood is tested for signs of infectious diseases, such as HIV and hepatitis. The blood is then tested to be sure it is compatible  with you in order to minimize the chance of a transfusion reaction. If you or a relative donates blood, this is often done in anticipation of surgery and is not appropriate for emergency situations. It  takes many days to process the donated blood. RISKS AND COMPLICATIONS Although transfusion therapy is very safe and saves many lives, the main dangers of transfusion include:  Getting an infectious disease. Developing a transfusion reaction. This is an allergic reaction to something in the blood you were given. Every precaution is taken to prevent this. The decision to have a blood transfusion has been considered carefully by your caregiver before blood is given. Blood is not given unless the benefits outweigh the risks. AFTER THE TRANSFUSION Right after receiving a blood transfusion, you will usually feel much better and more energetic. This is especially true if your red blood cells have gotten low (anemic). The transfusion raises the level of the red blood cells which carry oxygen, and this usually causes an energy increase. The nurse administering the transfusion will monitor you carefully for complications. HOME CARE INSTRUCTIONS  No special instructions are needed after a transfusion. You may find your energy is better. Speak with your caregiver about any limitations on activity for underlying diseases you may have. SEEK MEDICAL CARE IF:  Your condition is not improving after your transfusion. You develop redness or irritation at the intravenous (IV) site. SEEK IMMEDIATE MEDICAL CARE IF:  Any of the following symptoms occur over the next 12 hours: Shaking chills. You have a temperature by mouth above 102 F (38.9 C), not controlled by medicine. Chest, back, or muscle pain. People around you feel you are not acting correctly or are confused. Shortness of breath or difficulty breathing. Dizziness and fainting. You get a rash or develop hives. You have a decrease in urine output. Your urine turns a dark color or changes to pink, red, or brown. Any of the following symptoms occur over the next 10 days: You have a temperature by mouth above 102 F (38.9 C), not controlled by  medicine. Shortness of breath. Weakness after normal activity. The white part of the eye turns yellow (jaundice). You have a decrease in the amount of urine or are urinating less often. Your urine turns a dark color or changes to pink, red, or brown. Document Released: 06/17/2000 Document Revised: 09/12/2011 Document Reviewed: 02/04/2008 [facility] Patient Information 2014 [place], [place].  _______________________________________________________________________

## 2021-12-22 NOTE — Progress Notes (Addendum)
COVID Vaccine received:  []  No [x]  Yes  Date of any COVID positive Test in last 76 days:NO  PCP - Tawnya Crook, MD Cardiologist - no  Patient was educated on the possibility of a colostomy by Era Bumpers, RN at today's PST visit.   Chest x-ray - 1 view done 08-20-21 Epic EKG -  08-19-21 Epic Stress Test - N/A ECHO - N/A Cardiac Cath - N/A  Pacemaker/ICD device last checked: Date:       [x]  N/A Spinal Cord Stimulator:[x]  No []  Yes   Other Implants:   Bowel Prep - Patient to be on Clear liquid diet the day prior to surgery, Dulcolax, Miralax, Neomycin, Flagyl as per protocol. Patient will take 3 Ensures as per protocol.   History of Sleep Apnea? [x]  No []  Yes  []  unknown Sleep Study Date:   CPAP used?- [x]  No []  Yes  (Instruct to bring their mask & Tubing)  Does the patient monitor blood sugar? []  No []  Yes  [x]  N/A Does patient have a [employer] or Dexacom? []  No []  Yes   Fasting Blood Sugar Ranges-  Checks Blood Sugar _____ times a day  Blood Thinner Instructions: N/A Aspirin Instructions: Last Dose:  Activity level:  Can go up a flight of stairs and perform activities of daily living without stopping and without symptoms of chest pain or shortness of breath.[]  No [x]  Yes  []  N/A  Able to do exercises without symptoms []  No [x]  Yes  []  N/A  Patient unable to complete ADLs without assistance []  No [x]  Yes  []  N/A    Comments:   Anesthesia review:   Patient denies shortness of breath, fever, cough and chest pain at PAT appointment   Patient verbalized understanding and agreement to the Pre-Surgical Instructions that were given to them at this PAT appointment. Patient was also educated of the need to review these PAT instructions again prior to his/her surgery.I reviewed the appropriate phone numbers to call if they have any and questions or concerns.

## 2021-12-23 ENCOUNTER — Ambulatory Visit: Payer: Self-pay | Admitting: Surgery

## 2021-12-23 ENCOUNTER — Encounter (HOSPITAL_COMMUNITY)
Admission: RE | Admit: 2021-12-23 | Discharge: 2021-12-23 | Disposition: A | Discharge status: Home / Self Care | Payer: Medicare Other | Source: Ambulatory Visit | Attending: Surgery | Admitting: Surgery

## 2021-12-23 ENCOUNTER — Ambulatory Visit: Payer: Medicare Other

## 2021-12-23 ENCOUNTER — Encounter (HOSPITAL_COMMUNITY): Payer: Self-pay

## 2021-12-23 ENCOUNTER — Other Ambulatory Visit: Payer: Self-pay

## 2021-12-23 VITALS — BP 120/82 | HR 86 | Temp 99.0°F | Resp 14 | Ht 65.0 in | Wt 118.2 lb

## 2021-12-23 DIAGNOSIS — Z01812 Encounter for preprocedural laboratory examination: Secondary | ICD-10-CM | POA: Diagnosis present

## 2021-12-23 DIAGNOSIS — R1900 Intra-abdominal and pelvic swelling, mass and lump, unspecified site: Secondary | ICD-10-CM | POA: Insufficient documentation

## 2021-12-23 DIAGNOSIS — Z01818 Encounter for other preprocedural examination: Secondary | ICD-10-CM

## 2021-12-23 HISTORY — DX: Pneumonia, unspecified organism: J18.9

## 2021-12-23 LAB — TYPE AND SCREEN
ABO/RH(D): A POS
Antibody Screen: NEGATIVE

## 2021-12-23 LAB — HEMOGLOBIN A1C
Hgb A1c MFr Bld: 5.2 % (ref 4.8–5.6)
Mean Plasma Glucose: 102.54 mg/dL

## 2021-12-23 NOTE — Consult Note (Signed)
[facility] Nurse ostomy consult note  [facility] Nurse requested for preoperative stoma site marking by Dr. Johney [place]. Marking transpires during the patient's presurgical testing appointment.  Discussed surgical procedure and stoma creation with patient and patient's friend.  Explained role of the [facility] nurse team.   Answered patient and family member's questions.   Examined patient sitting and standing in order to place the marking in the patient's visual field, away from any creases or abdominal contour issues and within the rectus muscle. Sites for both an ileostomy and a colostomy are provided.  Marked for colostomy in the LLQ  __7__ cm to the left of the umbilicus and __6__OR below the umbilicus.  Marked for ileostomy in the RUQ  _5.5___cm to the right of the umbilicus and  __5.6__ cm above the umbilicus.   Patient's abdomen cleansed with CHG wipes at site markings, allowed to air dry prior to marking. Marks covered with thin film transparent dressing to preserve until date of surgery (01/05/22).    [facility] Nursing will assist with ostomy management and teaching should a stoma be created intraoperatively. Please reconsult if that is the case.   Thank you for inviting [place] to participate in this nice patient's Plan of Care.  Maudie Flakes, MSN, RN, CNS, [place], Serita Grammes, [employer], [employer] phone:  [phone]

## 2021-12-23 NOTE — Progress Notes (Deleted)
[facility] Nurse ostomy consult note  [facility] Nurse requested for preoperative stoma site marking by Dr. Johney [place]. Marking transpires during the patient's presurgical testing appointment.  Discussed surgical procedure and stoma creation with patient and patient's friend.  Explained role of the [facility] nurse team.   Answered patient and family member's questions.   Examined patient sitting and standing in order to place the marking in the patient's visual field, away from any creases or abdominal contour issues and within the rectus muscle. Sites for both an ileostomy and a colostomy are provided.  Marked for colostomy in the LLQ  __7__ cm to the left of the umbilicus and __1__OX below the umbilicus.  Marked for ileostomy in the RUQ  _5.5___cm to the right of the umbilicus and  __0.9__ cm above the umbilicus.   Patient's abdomen cleansed with CHG wipes at site markings, allowed to air dry prior to marking. Marks covered with thin film transparent dressing to preserve until date of surgery (01/05/22).    [facility] Nursing will assist with ostomy management and teaching should a stoma be created intraoperatively. Please reconsult if that is the case.   Thank you for inviting [place] to participate in this nice patient's Plan of Care.  Maudie Flakes, MSN, RN, CNS, [place], Serita Grammes, [employer], [employer] phone:  [phone]

## 2021-12-27 ENCOUNTER — Telehealth (HOSPITAL_COMMUNITY): Payer: Self-pay | Admitting: *Deleted

## 2022-01-05 ENCOUNTER — Inpatient Hospital Stay (HOSPITAL_BASED_OUTPATIENT_CLINIC_OR_DEPARTMENT_OTHER): Payer: Medicare Other | Admitting: Anesthesiology

## 2022-01-05 ENCOUNTER — Inpatient Hospital Stay (HOSPITAL_COMMUNITY): Payer: Medicare Other | Admitting: Anesthesiology

## 2022-01-05 ENCOUNTER — Other Ambulatory Visit: Payer: Self-pay

## 2022-01-05 ENCOUNTER — Ambulatory Visit (HOSPITAL_COMMUNITY)
Admission: RE | Admit: 2022-01-05 | Discharge: 2022-01-05 | Disposition: A | Discharge status: Home / Self Care | Payer: Medicare Other | Source: Ambulatory Visit | Attending: Surgery | Admitting: Surgery

## 2022-01-05 ENCOUNTER — Encounter (HOSPITAL_COMMUNITY)
Admission: RE | Disposition: A | Discharge status: Home / Self Care | Payer: Self-pay | Source: Ambulatory Visit | Attending: Surgery

## 2022-01-05 ENCOUNTER — Encounter (HOSPITAL_COMMUNITY): Payer: Self-pay | Admitting: Surgery

## 2022-01-05 DIAGNOSIS — K5900 Constipation, unspecified: Secondary | ICD-10-CM | POA: Insufficient documentation

## 2022-01-05 DIAGNOSIS — M329 Systemic lupus erythematosus, unspecified: Secondary | ICD-10-CM | POA: Diagnosis not present

## 2022-01-05 DIAGNOSIS — N739 Female pelvic inflammatory disease, unspecified: Secondary | ICD-10-CM

## 2022-01-05 DIAGNOSIS — E039 Hypothyroidism, unspecified: Secondary | ICD-10-CM | POA: Diagnosis not present

## 2022-01-05 DIAGNOSIS — Z01818 Encounter for other preprocedural examination: Secondary | ICD-10-CM

## 2022-01-05 DIAGNOSIS — K36 Other appendicitis: Secondary | ICD-10-CM

## 2022-01-05 DIAGNOSIS — Z79899 Other long term (current) drug therapy: Secondary | ICD-10-CM | POA: Insufficient documentation

## 2022-01-05 DIAGNOSIS — K35211 Acute appendicitis with generalized peritonitis, with perforation and abscess: Secondary | ICD-10-CM

## 2022-01-05 DIAGNOSIS — Z87891 Personal history of nicotine dependence: Secondary | ICD-10-CM | POA: Insufficient documentation

## 2022-01-05 DIAGNOSIS — K3521 Acute appendicitis with generalized peritonitis, with abscess: Secondary | ICD-10-CM

## 2022-01-05 DIAGNOSIS — F418 Other specified anxiety disorders: Secondary | ICD-10-CM | POA: Insufficient documentation

## 2022-01-05 DIAGNOSIS — K3533 Acute appendicitis with perforation and localized peritonitis, with abscess: Secondary | ICD-10-CM | POA: Diagnosis not present

## 2022-01-05 DIAGNOSIS — F4310 Post-traumatic stress disorder, unspecified: Secondary | ICD-10-CM | POA: Insufficient documentation

## 2022-01-05 HISTORY — PX: APPENDECTOMY: SHX54

## 2022-01-05 LAB — CBC
HCT: 36.2 % (ref 36.0–46.0)
Hemoglobin: 12.3 g/dL (ref 12.0–15.0)
MCH: 33.4 pg (ref 26.0–34.0)
MCHC: 34 g/dL (ref 30.0–36.0)
MCV: 98.4 fL (ref 80.0–100.0)
Platelets: 225 10*3/uL (ref 150–400)
RBC: 3.68 MIL/uL — ABNORMAL LOW (ref 3.87–5.11)
RDW: 13.1 % (ref 11.5–15.5)
WBC: 5.9 10*3/uL (ref 4.0–10.5)
nRBC: 0 % (ref 0.0–0.2)

## 2022-01-05 LAB — ABO/RH: ABO/RH(D): A POS

## 2022-01-05 SURGERY — COLECTOMY, RIGHT, ROBOT-ASSISTED
Anesthesia: General

## 2022-01-05 MED ORDER — LACTATED RINGERS IV SOLN: INTRAVENOUS | Status: DC

## 2022-01-05 MED ORDER — MIDAZOLAM HCL 2 MG/2ML IJ SOLN
INTRAMUSCULAR | Status: DC | PRN
  Administered 2022-01-05: 2 mg via INTRAVENOUS

## 2022-01-05 MED ORDER — LIDOCAINE HCL 2 % IJ SOLN
INTRAMUSCULAR | Status: AC
  Filled 2022-01-05: qty 20

## 2022-01-05 MED ORDER — FENTANYL CITRATE (PF) 250 MCG/5ML IJ SOLN
INTRAMUSCULAR | Status: AC
  Filled 2022-01-05: qty 5

## 2022-01-05 MED ORDER — PHENYLEPHRINE 80 MCG/ML (10ML) SYRINGE FOR IV PUSH (FOR BLOOD PRESSURE SUPPORT)
PREFILLED_SYRINGE | INTRAVENOUS | Status: DC | PRN
  Administered 2022-01-05: 80 ug via INTRAVENOUS
  Administered 2022-01-05: 160 ug via INTRAVENOUS

## 2022-01-05 MED ORDER — MIDAZOLAM HCL 2 MG/2ML IJ SOLN
INTRAMUSCULAR | Status: AC
  Filled 2022-01-05: qty 2

## 2022-01-05 MED ORDER — ONDANSETRON HCL 4 MG/2ML IJ SOLN
INTRAMUSCULAR | Status: AC
  Filled 2022-01-05: qty 2

## 2022-01-05 MED ORDER — ROCURONIUM BROMIDE 10 MG/ML (PF) SYRINGE
PREFILLED_SYRINGE | INTRAVENOUS | Status: AC
  Filled 2022-01-05: qty 10

## 2022-01-05 MED ORDER — KETAMINE HCL 10 MG/ML IJ SOLN
INTRAMUSCULAR | Status: DC | PRN
  Administered 2022-01-05: 40 mg via INTRAVENOUS

## 2022-01-05 MED ORDER — HEPARIN SODIUM (PORCINE) 5000 UNIT/ML IJ SOLN
INTRAMUSCULAR | Status: AC
  Filled 2022-01-05: qty 1

## 2022-01-05 MED ORDER — FENTANYL CITRATE PF 50 MCG/ML IJ SOSY
25.0000 ug | PREFILLED_SYRINGE | INTRAMUSCULAR | Status: DC | PRN
  Administered 2022-01-05: 50 ug via INTRAVENOUS

## 2022-01-05 MED ORDER — FENTANYL CITRATE PF 50 MCG/ML IJ SOSY
PREFILLED_SYRINGE | INTRAMUSCULAR | Status: AC
  Filled 2022-01-05: qty 1

## 2022-01-05 MED ORDER — LIDOCAINE 2% (20 MG/ML) 5 ML SYRINGE
INTRAMUSCULAR | Status: DC | PRN
  Administered 2022-01-05: 40 mg via INTRAVENOUS
  Administered 2022-01-05: 1.5 mg/kg/h via INTRAVENOUS

## 2022-01-05 MED ORDER — ALVIMOPAN 12 MG PO CAPS: 12.0000 mg | ORAL_CAPSULE | ORAL | Status: DC

## 2022-01-05 MED ORDER — PHENYLEPHRINE 80 MCG/ML (10ML) SYRINGE FOR IV PUSH (FOR BLOOD PRESSURE SUPPORT)
PREFILLED_SYRINGE | INTRAVENOUS | Status: AC
  Filled 2022-01-05: qty 10

## 2022-01-05 MED ORDER — BUPIVACAINE LIPOSOME 1.3 % IJ SUSP: 20.0000 mL | Freq: Once | INTRAMUSCULAR | Status: DC

## 2022-01-05 MED ORDER — ORAL CARE MOUTH RINSE: 15.0000 mL | Freq: Once | OROMUCOSAL | Status: AC

## 2022-01-05 MED ORDER — GABAPENTIN 300 MG PO CAPS: 300.0000 mg | ORAL_CAPSULE | ORAL | Status: DC

## 2022-01-05 MED ORDER — METHOCARBAMOL 500 MG PO TABS: 1000.0000 mg | ORAL_TABLET | Freq: Four times a day (QID) | ORAL | Status: DC | PRN

## 2022-01-05 MED ORDER — ACETAMINOPHEN 500 MG PO TABS: 1000.0000 mg | ORAL_TABLET | Freq: Once | ORAL | Status: DC

## 2022-01-05 MED ORDER — DEXAMETHASONE SODIUM PHOSPHATE 10 MG/ML IJ SOLN
INTRAMUSCULAR | Status: DC | PRN
  Administered 2022-01-05: 5 mg via INTRAVENOUS

## 2022-01-05 MED ORDER — KETAMINE HCL 10 MG/ML IJ SOLN: INTRAMUSCULAR | Status: DC | PRN

## 2022-01-05 MED ORDER — BUPIVACAINE-EPINEPHRINE (PF) 0.25% -1:200000 IJ SOLN
INTRAMUSCULAR | Status: DC | PRN
  Administered 2022-01-05: 50 mL via PERINEURAL

## 2022-01-05 MED ORDER — ENOXAPARIN SODIUM 40 MG/0.4ML IJ SOSY: 40.0000 mg | PREFILLED_SYRINGE | Freq: Once | INTRAMUSCULAR | Status: DC

## 2022-01-05 MED ORDER — ROCURONIUM BROMIDE 10 MG/ML (PF) SYRINGE
PREFILLED_SYRINGE | INTRAVENOUS | Status: DC | PRN
  Administered 2022-01-05: 60 mg via INTRAVENOUS
  Administered 2022-01-05: 10 mg via INTRAVENOUS

## 2022-01-05 MED ORDER — SODIUM CHLORIDE 0.9 % IV SOLN
1.0000 g | INTRAVENOUS | Status: AC
  Administered 2022-01-05: 1 g via INTRAVENOUS
  Filled 2022-01-05: qty 1

## 2022-01-05 MED ORDER — PROPOFOL 10 MG/ML IV BOLUS
INTRAVENOUS | Status: AC
  Filled 2022-01-05: qty 20

## 2022-01-05 MED ORDER — TRAMADOL HCL 50 MG PO TABS: 50.0000 mg | ORAL_TABLET | Freq: Four times a day (QID) | ORAL | 0 refills | Status: DC | PRN

## 2022-01-05 MED ORDER — FENTANYL CITRATE (PF) 250 MCG/5ML IJ SOLN
INTRAMUSCULAR | Status: DC | PRN
  Administered 2022-01-05 (×3): 50 ug via INTRAVENOUS
  Administered 2022-01-05: 100 ug via INTRAVENOUS

## 2022-01-05 MED ORDER — ORAL CARE MOUTH RINSE: 15.0000 mL | Freq: Once | OROMUCOSAL | Status: DC

## 2022-01-05 MED ORDER — TRAMADOL HCL 50 MG PO TABS: 50.0000 mg | ORAL_TABLET | Freq: Four times a day (QID) | ORAL | Status: DC | PRN

## 2022-01-05 MED ORDER — ONDANSETRON HCL 4 MG/2ML IJ SOLN
INTRAMUSCULAR | Status: DC | PRN
  Administered 2022-01-05: 4 mg via INTRAVENOUS

## 2022-01-05 MED ORDER — PROPOFOL 10 MG/ML IV BOLUS
INTRAVENOUS | Status: DC | PRN
  Administered 2022-01-05: 120 mg via INTRAVENOUS

## 2022-01-05 MED ORDER — CHLORHEXIDINE GLUCONATE 0.12 % MT SOLN
15.0000 mL | Freq: Once | OROMUCOSAL | Status: AC
  Administered 2022-01-05: 15 mL via OROMUCOSAL

## 2022-01-05 MED ORDER — CHLORHEXIDINE GLUCONATE 0.12 % MT SOLN: 15.0000 mL | Freq: Once | OROMUCOSAL | Status: DC

## 2022-01-05 MED ORDER — ACETAMINOPHEN 500 MG PO TABS
1000.0000 mg | ORAL_TABLET | ORAL | Status: AC
  Administered 2022-01-05: 1000 mg via ORAL
  Filled 2022-01-05: qty 2

## 2022-01-05 MED ORDER — SODIUM CHLORIDE 0.9 % IR SOLN
Status: DC | PRN
  Administered 2022-01-05: 1000 mL

## 2022-01-05 MED ORDER — LACTATED RINGERS IR SOLN
Status: DC | PRN
  Administered 2022-01-05: 1000 mL

## 2022-01-05 MED ORDER — CELECOXIB 200 MG PO CAPS: 200.0000 mg | ORAL_CAPSULE | ORAL | Status: DC

## 2022-01-05 MED ORDER — LACTATED RINGERS IV SOLN: INTRAVENOUS | Status: DC | PRN

## 2022-01-05 MED ORDER — LIDOCAINE HCL (PF) 2 % IJ SOLN
INTRAMUSCULAR | Status: AC
  Filled 2022-01-05: qty 5

## 2022-01-05 MED ORDER — BUPIVACAINE LIPOSOME 1.3 % IJ SUSP
INTRAMUSCULAR | Status: AC
  Filled 2022-01-05: qty 20

## 2022-01-05 MED ORDER — SUGAMMADEX SODIUM 200 MG/2ML IV SOLN
INTRAVENOUS | Status: DC | PRN
  Administered 2022-01-05: 130 mg via INTRAVENOUS

## 2022-01-05 MED ORDER — DEXAMETHASONE SODIUM PHOSPHATE 10 MG/ML IJ SOLN
INTRAMUSCULAR | Status: AC
  Filled 2022-01-05: qty 1

## 2022-01-05 MED ORDER — BUPIVACAINE-EPINEPHRINE 0.25% -1:200000 IJ SOLN
INTRAMUSCULAR | Status: AC
  Filled 2022-01-05: qty 1

## 2022-01-05 MED ORDER — BUPIVACAINE LIPOSOME 1.3 % IJ SUSP
INTRAMUSCULAR | Status: DC | PRN
  Administered 2022-01-05: 20 mL

## 2022-01-05 SURGICAL SUPPLY — 116 items
APPLIER CLIP 5 13 M/L LIGAMAX5 (MISCELLANEOUS)
APPLIER CLIP ROT 10 11.4 M/L (STAPLE)
BAG COUNTER SPONGE SURGICOUNT (BAG) ×1 IMPLANT
BLADE EXTENDED COATED 6.5IN (ELECTRODE) IMPLANT
CANNULA REDUC XI 12-8 STAPL (CANNULA) ×1
CANNULA REDUCER 12-8 DVNC XI (CANNULA) IMPLANT
CELLS DAT CNTRL 66122 CELL SVR (MISCELLANEOUS) ×1 IMPLANT
CHLORAPREP W/TINT 26 (MISCELLANEOUS) IMPLANT
CLIP APPLIE 5 13 M/L LIGAMAX5 (MISCELLANEOUS) IMPLANT
CLIP APPLIE ROT 10 11.4 M/L (STAPLE) IMPLANT
COVER SURGICAL LIGHT HANDLE (MISCELLANEOUS) ×4 IMPLANT
COVER TIP SHEARS 8 DVNC (MISCELLANEOUS) ×1 IMPLANT
COVER TIP SHEARS 8MM DA VINCI (MISCELLANEOUS) ×1
DEVICE TROCAR PUNCTURE CLOSURE (ENDOMECHANICALS) IMPLANT
DRAIN CHANNEL 19F RND (DRAIN) IMPLANT
DRAPE ARM DVNC X/XI (DISPOSABLE) ×4 IMPLANT
DRAPE COLUMN DVNC XI (DISPOSABLE) ×1 IMPLANT
DRAPE DA VINCI XI ARM (DISPOSABLE) ×4
DRAPE DA VINCI XI COLUMN (DISPOSABLE) ×1
DRAPE SURG IRRIG POUCH 19X23 (DRAPES) ×2 IMPLANT
DRSG OPSITE POSTOP 4X10 (GAUZE/BANDAGES/DRESSINGS) IMPLANT
DRSG OPSITE POSTOP 4X6 (GAUZE/BANDAGES/DRESSINGS) IMPLANT
DRSG OPSITE POSTOP 4X8 (GAUZE/BANDAGES/DRESSINGS) IMPLANT
DRSG TEGADERM 2-3/8X2-3/4 SM (GAUZE/BANDAGES/DRESSINGS) ×10 IMPLANT
DRSG TEGADERM 4X4.75 (GAUZE/BANDAGES/DRESSINGS) IMPLANT
ELECT PENCIL ROCKER SW 15FT (MISCELLANEOUS) ×2 IMPLANT
ELECT REM PT RETURN 15FT ADLT (MISCELLANEOUS) ×2 IMPLANT
ENDOLOOP SUT PDS II  0 18 (SUTURE)
ENDOLOOP SUT PDS II 0 18 (SUTURE) IMPLANT
EVACUATOR SILICONE 100CC (DRAIN) IMPLANT
GAUZE SPONGE 2X2 8PLY STRL LF (GAUZE/BANDAGES/DRESSINGS) ×1 IMPLANT
GLOVE ECLIPSE 8.0 STRL XLNG CF (GLOVE) ×6 IMPLANT
GLOVE INDICATOR 8.0 STRL GRN (GLOVE) ×6 IMPLANT
GOWN SRG XL LVL 4 BRTHBL STRL (GOWNS) ×1 IMPLANT
GOWN STRL NON-REIN XL LVL4 (GOWNS) ×1
GOWN STRL REUS W/ TWL XL LVL3 (GOWN DISPOSABLE) ×4 IMPLANT
GOWN STRL REUS W/TWL XL LVL3 (GOWN DISPOSABLE) ×4
GRASPER SUT TROCAR 14GX15 (MISCELLANEOUS) IMPLANT
HOLDER FOLEY CATH W/STRAP (MISCELLANEOUS) ×2 IMPLANT
IRRIG SUCT STRYKERFLOW 2 WTIP (MISCELLANEOUS) ×2
IRRIGATION SUCT STRKRFLW 2 WTP (MISCELLANEOUS) ×1 IMPLANT
KIT PROCEDURE DA VINCI SI (MISCELLANEOUS)
KIT PROCEDURE DVNC SI (MISCELLANEOUS) IMPLANT
KIT SIGMOIDOSCOPE (SET/KITS/TRAYS/PACK) IMPLANT
KIT TURNOVER KIT A (KITS) ×1 IMPLANT
NDL INSUFFLATION 14GA 120MM (NEEDLE) ×1 IMPLANT
NEEDLE INSUFFLATION 14GA 120MM (NEEDLE) ×2 IMPLANT
PACK CARDIOVASCULAR III (CUSTOM PROCEDURE TRAY) ×2 IMPLANT
PACK COLON (CUSTOM PROCEDURE TRAY) ×2 IMPLANT
PAD POSITIONING PINK XL (MISCELLANEOUS) ×2 IMPLANT
POUCH RETRIEVAL ECOSAC 10 (ENDOMECHANICALS) IMPLANT
POUCH RETRIEVAL ECOSAC 10MM (ENDOMECHANICALS) ×1
PROTECTOR NERVE ULNAR (MISCELLANEOUS) ×4 IMPLANT
RELOAD STAPLE 45 3.5 BLU DVNC (STAPLE) IMPLANT
RELOAD STAPLE 45 4.3 GRN DVNC (STAPLE) IMPLANT
RELOAD STAPLE 60 3.5 BLU DVNC (STAPLE) IMPLANT
RELOAD STAPLE 60 4.3 GRN DVNC (STAPLE) IMPLANT
RELOAD STAPLER 3.5X45 BLU DVNC (STAPLE) IMPLANT
RELOAD STAPLER 3.5X60 BLU DVNC (STAPLE) ×2 IMPLANT
RELOAD STAPLER 4.3X45 GRN DVNC (STAPLE) IMPLANT
RELOAD STAPLER 4.3X60 GRN DVNC (STAPLE) IMPLANT
RETRACTOR WND ALEXIS 18 MED (MISCELLANEOUS) IMPLANT
RTRCTR WOUND ALEXIS 18CM MED (MISCELLANEOUS) ×2
SCISSORS LAP 5X35 DISP (ENDOMECHANICALS) ×2 IMPLANT
SEAL CANN UNIV 5-8 DVNC XI (MISCELLANEOUS) ×3 IMPLANT
SEAL XI 5MM-8MM UNIVERSAL (MISCELLANEOUS) ×3
SEALER VESSEL DA VINCI XI (MISCELLANEOUS) ×1
SEALER VESSEL EXT DVNC XI (MISCELLANEOUS) ×1 IMPLANT
SOLUTION ELECTROLUBE (MISCELLANEOUS) ×2 IMPLANT
SPIKE FLUID TRANSFER (MISCELLANEOUS) ×1 IMPLANT
SPONGE GAUZE 2X2 STER 10/PKG (GAUZE/BANDAGES/DRESSINGS) ×1
STAPLER 45 DA VINCI SURE FORM (STAPLE)
STAPLER 45 SUREFORM DVNC (STAPLE) IMPLANT
STAPLER 60 DA VINCI SURE FORM (STAPLE) ×1
STAPLER 60 SUREFORM DVNC (STAPLE) IMPLANT
STAPLER CANNULA SEAL DVNC XI (STAPLE) ×1 IMPLANT
STAPLER CANNULA SEAL XI (STAPLE) ×1
STAPLER ECHELON POWER CIR 29 (STAPLE) IMPLANT
STAPLER ECHELON POWER CIR 31 (STAPLE) IMPLANT
STAPLER RELOAD 3.5X45 BLU DVNC (STAPLE)
STAPLER RELOAD 3.5X45 BLUE (STAPLE)
STAPLER RELOAD 3.5X60 BLU DVNC (STAPLE) ×2
STAPLER RELOAD 3.5X60 BLUE (STAPLE) ×2
STAPLER RELOAD 4.3X45 GREEN (STAPLE)
STAPLER RELOAD 4.3X45 GRN DVNC (STAPLE)
STAPLER RELOAD 4.3X60 GREEN (STAPLE)
STAPLER RELOAD 4.3X60 GRN DVNC (STAPLE)
STOPCOCK 4 WAY LG BORE MALE ST (IV SETS) ×4 IMPLANT
SURGILUBE 2OZ TUBE FLIPTOP (MISCELLANEOUS) IMPLANT
SUT MNCRL AB 4-0 PS2 18 (SUTURE) ×2 IMPLANT
SUT PDS AB 1 CT1 27 (SUTURE) ×3 IMPLANT
SUT PROLENE 0 CT 2 (SUTURE) IMPLANT
SUT PROLENE 2 0 KS (SUTURE) IMPLANT
SUT PROLENE 2 0 SH DA (SUTURE) IMPLANT
SUT SILK 2 0 (SUTURE)
SUT SILK 2 0 SH CR/8 (SUTURE) IMPLANT
SUT SILK 2-0 18XBRD TIE 12 (SUTURE) IMPLANT
SUT SILK 3 0 (SUTURE)
SUT SILK 3 0 SH CR/8 (SUTURE) ×1 IMPLANT
SUT SILK 3-0 18XBRD TIE 12 (SUTURE) IMPLANT
SUT V-LOC BARB 180 2/0GR6 GS22 (SUTURE)
SUT VIC AB 3-0 SH 18 (SUTURE) IMPLANT
SUT VIC AB 3-0 SH 27 (SUTURE)
SUT VIC AB 3-0 SH 27XBRD (SUTURE) IMPLANT
SUT VICRYL 0 UR6 27IN ABS (SUTURE) ×2 IMPLANT
SUTURE V-LC BRB 180 2/0GR6GS22 (SUTURE) IMPLANT
SYR 10ML ECCENTRIC (SYRINGE) ×2 IMPLANT
SYS LAPSCP GELPORT 120MM (MISCELLANEOUS)
SYS WOUND ALEXIS 18CM MED (MISCELLANEOUS)
SYSTEM LAPSCP GELPORT 120MM (MISCELLANEOUS) IMPLANT
SYSTEM WOUND ALEXIS 18CM MED (MISCELLANEOUS) ×1 IMPLANT
TOWEL OR NON WOVEN STRL DISP B (DISPOSABLE) ×2 IMPLANT
TRAY FOLEY MTR SLVR 16FR STAT (SET/KITS/TRAYS/PACK) ×2 IMPLANT
TROCAR ADV FIXATION 5X100MM (TROCAR) ×2 IMPLANT
TUBING CONNECTING 10 (TUBING) ×3 IMPLANT
TUBING INSUFFLATION 10FT LAP (TUBING) ×2 IMPLANT

## 2022-01-05 NOTE — Transfer of Care (Signed)
Immediate Anesthesia Transfer of Care Note  Patient: Deanna Fischer  Procedure(s) Performed: XI ROBOT ASSISTED APPENDECTOMY, LYSIS OF ADHESIONS, BILATERAL TAP BLOCK  Patient Location: PACU  Anesthesia Type:General  Level of Consciousness: awake and alert   Airway & Oxygen Therapy: Patient Spontanous Breathing and Patient connected to face mask oxygen  Post-op Assessment: Report given to RN and Post -op Vital signs reviewed and stable  Post vital signs: Reviewed and stable  Last Vitals:  Vitals Value Taken Time  BP 137/78 01/05/22 1346  Temp    Pulse 74 01/05/22 1349  Resp 12 01/05/22 1349  SpO2 100 % 01/05/22 1349  Vitals shown include unvalidated device data.  Last Pain:  Vitals:   01/05/22 1051  TempSrc:   PainSc: 5          Complications: No notable events documented.

## 2022-01-05 NOTE — Anesthesia Preprocedure Evaluation (Addendum)
Anesthesia Evaluation  Patient identified by MRN, date of birth, ID band Patient awake    Reviewed: Allergy & Precautions, NPO status , Patient's Chart, lab work & pertinent test results  Airway Mallampati: II  TM Distance: >3 FB Neck ROM: Full    Dental no notable dental hx. (+) Teeth Intact, Dental Advisory Given   Pulmonary neg pulmonary ROS, Current Smoker and Patient abstained from smoking.,    Pulmonary exam normal breath sounds clear to auscultation       Cardiovascular negative cardio ROS Normal cardiovascular exam Rhythm:Regular Rate:Normal     Neuro/Psych PSYCHIATRIC DISORDERS Anxiety Depression negative neurological ROS     GI/Hepatic negative GI ROS, Neg liver ROS,   Endo/Other  Hypothyroidism SLE on plaquenil  Renal/GU negative Renal ROS  negative genitourinary   Musculoskeletal negative musculoskeletal ROS (+)   Abdominal   Peds  Hematology negative hematology ROS (+)   Anesthesia Other Findings   Reproductive/Obstetrics                            Anesthesia Physical Anesthesia Plan  ASA: 2  Anesthesia Plan: General   Post-op Pain Management: Tylenol PO (pre-op)*, Lidocaine infusion* and Ketamine IV*   Induction: Intravenous  PONV Risk Score and Plan: 2 and Midazolam, Dexamethasone and Ondansetron  Airway Management Planned: Oral ETT  Additional Equipment:   Intra-op Plan:   Post-operative Plan: Extubation in OR  Informed Consent: I have reviewed the patients History and Physical, chart, labs and discussed the procedure including the risks, benefits and alternatives for the proposed anesthesia with the patient or authorized representative who has indicated his/her understanding and acceptance.     Dental advisory given  Plan Discussed with: CRNA  Anesthesia Plan Comments:        Anesthesia Quick Evaluation

## 2022-01-05 NOTE — Interval H&P Note (Signed)
History and Physical Interval Note:  01/05/2022 10:52 AM  Deanna Fischer  has presented today for surgery, with the diagnosis of PELVIC ABSCESS. PROBABLE APPENDICITIS VERSUS SIGMOID DIVERTICULITIS.  The various methods of treatment have been discussed with the patient and family. After consideration of risks, benefits and other options for treatment, the patient has consented to  Procedure(s): XI ROBOT ASSISTED PROXIMAL COLECTOMY, POSSIBLE APPENDECTOMY,POSSIBLE XI ROBOT ASSISTED SIGMOID COLECTOMY (N/A) as a surgical intervention.  The patient's history has been reviewed, patient examined, no change in status, stable for surgery.  I have reviewed the patient's chart and labs.  Questions were answered to the patient's satisfaction.    I have re-reviewed the the patient's records, history, medications, and allergies.  I have re-examined the patient.  I again discussed intraoperative plans and goals of post-operative recovery.  The patient agrees to proceed.  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[place]) 08/24/2021   Electrolyte imbalance 08/24/2021   Hypothyroidism 08/17/2021   Hyponatremia 08/17/2021   Hypokalemia 08/17/2021   Normocytic anemia 08/17/2021   Depression with anxiety 08/17/2021   Claustrophobia 08/17/2021   Dyslipidemia 08/17/2021   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   Systemic lupus erythematosus ([place]) 08/17/2021   Hypophosphatemia 08/17/2021   Enteritis 08/16/2021   PTSD (post-traumatic stress disorder) 04/24/2020    Past Medical History:  Diagnosis Date   Allergy    Anemia    Claustrophobia  08/17/2021   Depression with anxiety 08/17/2021   Dyslipidemia 08/17/2021   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   Hyperlipidemia    Hypothyroidism 08/17/2021   Lupus ([place])    Pneumonia    Last 15 years ago   PTSD (post-traumatic stress disorder) 04/24/2020   Systemic lupus erythematosus ([place]) 08/17/2021    Past Surgical History:  Procedure Laterality Date   TONSILLECTOMY     WISDOM TOOTH EXTRACTION      Social History   Socioeconomic History   Marital status: Single    Spouse name: Not on file   Number of children: Not on file   Years of education: Not on file   Highest education level: Not on file  Occupational History   Not on file  Tobacco Use   Smoking status: Some Days    Packs/day: 0.25    Years: 25.00    Total pack years: 6.25    Types: Cigarettes   Smokeless tobacco: Never  Vaping Use   Vaping Use: Never used  Substance and Sexual Activity   Alcohol use: Yes    Alcohol/week: 9.0 standard drinks of alcohol    Types: 9 Glasses of wine per week   Drug use: Never   Sexual activity: Not Currently    Birth control/protection: Post-menopausal  Other Topics Concern   Not on file  Social History Narrative   Not on file   Social Determinants of Health   Financial Resource Strain: Low Risk  (11/16/2021)   Overall Financial Resource Strain (CARDIA)    Difficulty of Paying Living Expenses: Not hard at all  Food Insecurity: No Food Insecurity (11/16/2021)   Hunger Vital Sign    Worried About Running Out of Food in the Last Year: Never true  Ran Out of Food in the Last Year: Never true  Transportation Needs: No Transportation Needs (11/16/2021)   PRAPARE - Hydrologist (Medical): No    Lack of Transportation (Non-Medical): No  Physical Activity: Inactive (11/16/2021)   Exercise Vital Sign    Days of Exercise per Week: 0 days    Minutes of Exercise per Session: 0 min  Stress: Stress Concern Present (11/16/2021)   Onward    Feeling of Stress : To some extent  Social Connections: Moderately Integrated (11/16/2021)   Social Connection and Isolation Panel [NHANES]    Frequency of Communication with Friends and Family: More than three times a week    Frequency of Social Gatherings with Friends and Family: Once a week    Attends Religious Services: More than 4 times per year    Active Member of [employer] or Organizations: Yes    Attends Archivist Meetings: 1 to 4 times per year    Marital Status: Never married  Intimate Partner Violence: Not At Risk (11/16/2021)   Humiliation, Afraid, Rape, and Kick questionnaire    Fear of Current or Ex-Partner: No    Emotionally Abused: No    Physically Abused: No    Sexually Abused: No    Family History  Problem Relation Age of Onset   Depression Mother    Cancer Mother    Arthritis Mother    Breast cancer Mother    COPD Mother    Heart disease Father    Hyperlipidemia Father    Alcohol abuse Father    Depression Father    Coronary artery disease Father    Arrhythmia Father    Heart attack Father    Breast cancer Sister    Drug abuse Sister    Colon cancer Neg Hx    Esophageal cancer Neg Hx    Stomach cancer Neg Hx    Pancreatic cancer Neg Hx     Medications Prior to Admission  Medication Sig Dispense Refill Last Dose   ALPRAZolam (XANAX) 0.5 MG tablet Take 0.5 mg by mouth at bedtime as needed for anxiety (for anxiety). Patient took 01/05/22 at 5am   01/05/2022   Ascorbic Acid (VITAMIN C) 1000 MG tablet Take 1,000 mg by mouth every morning.   Past Week   BIOTIN PO Take 1 tablet by mouth every morning.   Past Week   buPROPion (WELLBUTRIN XL) 300 MG 24 hr tablet Take 150 mg by mouth every morning.   01/04/2022   Calcium Carbonate-Vitamin D (CALCIUM-D PO) Take 2 tablets by mouth every morning.   01/04/2022   ciprofloxacin (CIPRO) 500 MG tablet Take 500 mg by mouth 2 (two) times daily.   completed course    escitalopram (LEXAPRO) 10 MG tablet TAKE 2 TABLETS(20 MG) BY MOUTH EVERY MORNING (Patient taking differently: Take 20 mg by mouth every morning.) 60 tablet 0 01/04/2022   hydroxychloroquine (PLAQUENIL) 200 MG tablet TAKE 1 AND 1/2 TABLETS BY MOUTH EVERY DAY (Patient taking differently: Take 200 mg by mouth every morning.) 135 tablet 0 01/04/2022   ibuprofen (ADVIL) 200 MG tablet Take 400-800 mg by mouth every 6 (six) hours as needed (pain).   01/04/2022   levothyroxine (SYNTHROID) 50 MCG tablet Take 50 mcg by mouth daily before breakfast.   01/04/2022   metroNIDAZOLE (FLAGYL) 500 MG tablet Take 500 mg by mouth 3 (three) times daily.   completed course   Multiple  Vitamin (MULTIVITAMIN WITH MINERALS) TABS tablet Take 2 tablets by mouth every morning. Plant based multi vitamin      ondansetron (ZOFRAN ODT) 4 MG disintegrating tablet Take 1 tablet (4 mg total) by mouth every 8 (eight) hours as needed for nausea or vomiting. 20 tablet 0 01/05/2022   Propylene Glycol, PF, (SYSTANE COMPLETE PF) 0.6 % SOLN Place 1 drop into both eyes 5 (five) times daily as needed (dry eyes).   01/05/2022   Saccharomyces boulardii (FLORASTOR PO) Take 2 capsules by mouth every morning.   Past Month   traZODone (DESYREL) 50 MG tablet Take 50 mg by mouth at bedtime as needed for sleep.   Past Week    Current Facility-Administered Medications  Medication Dose Route Frequency Provider Last Rate Last Admin   chlorhexidine (PERIDEX) 0.12 % solution 15 mL  15 mL Mouth/Throat Once Ellender, Karyl Kinnier, MD       Or   Oral care mouth rinse  15 mL Mouth Rinse Once Ellender, Karyl Kinnier, MD       lactated ringers infusion   Intravenous Continuous Ellender, Karyl Kinnier, MD         Allergies  Allergen Reactions   Penicillins Nausea And Vomiting   Amoxicillin Nausea And Vomiting    Caused nausea, vomiting and diarrhea    BP 122/84   Pulse 74   Temp 99.5 F (37.5 C) (Oral)   Resp 15   Ht 5' 5"  (1.651 m)   Wt 53 kg   SpO2 98%   BMI 19.44 kg/m    Labs: No results found for this or any previous visit (from the past 48 hour(s)).  Imaging / Studies: CT ABDOMEN PELVIS W CONTRAST  Result Date: 12/17/2021 CLINICAL DATA:  Pelvis, female abscess N73.9 (ICD-10-CM) EXAM: CT ABDOMEN AND PELVIS WITH CONTRAST TECHNIQUE: Multidetector CT imaging of the abdomen and pelvis was performed using the standard protocol following bolus administration of intravenous contrast. RADIATION DOSE REDUCTION: This exam was performed according to the departmental dose-optimization program which includes automated exposure control, adjustment of the mA and/or kV according to patient size and/or use of iterative reconstruction technique. CONTRAST:  165m OMNIPAQUE IOHEXOL 300 MG/ML  SOLN COMPARISON:  09/07/2021 FINDINGS: Lower chest: No acute abnormality. Hepatobiliary: Liver remains mildly enlarged. Parenchymal attenuation within normal limits. There are a few tiny subcentimeter low-density lesions within the right hepatic lobe, too small to characterize. No intrahepatic biliary dilatation. Recanalized umbilical vein. Gallbladder within normal limits. No hyperdense gallstone. Pancreas: Unremarkable. No pancreatic ductal dilatation or surrounding inflammatory changes. Spleen: Normal in size without focal abnormality. Adrenals/Urinary Tract: Adrenal glands are unremarkable. Kidneys are normal, without renal calculi, focal lesion, or hydronephrosis. Bladder is unremarkable. Stomach/Bowel: Bowel is well opacified with oral contrast. Circumferentially thickened loop of bowel at the base of the cecum is favored to represent distal ileum, although this could potentially represent a thick walled appendix (series 2, images 52-58). New adjacent walled-off fluid collection in the right lower quadrant with thin rim enhancement measuring 2.6 x 1.7 x 2.1 cm (series 2, image 56). Relative lack of adjacent fat stranding adjacent to this collection. No adjacent free fluid. No abnormally dilated  loops of bowel to suggest obstruction. No additional site of focal bowel wall thickening is seen. Vascular/Lymphatic: Scattered aortoiliac atherosclerotic calcifications without aneurysm. No abdominopelvic lymphadenopathy. Reproductive: Uterus and bilateral adnexa are unremarkable. Other: No ascites.  No pneumoperitoneum.  No abdominal wall hernia. Musculoskeletal: Chronic mild superior endplate compression deformity of T12. No new or acute  bony findings. IMPRESSION: 1. New 2.6 x 1.7 x 2.1 cm thin-walled peripherally enhancing fluid collection in the right lower quadrant. Relative lack of adjacent fat stranding or free fluid. This could represent a small seroma or abscess. 2. Circumferentially thickened loop of bowel at the base of the cecum is favored to represent distal ileum, although this could potentially represent a thick-walled appendix. Correlate for clinical signs of appendicitis. Aortic Atherosclerosis (ICD10-I70.0). Electronically Signed   By: Davina Poke D.O.   On: 12/17/2021 11:57     .Adin Hector, M.D., F.A.C.S. Gastrointestinal and Minimally Invasive Surgery [facility] [address], [place], [place] [address] [phone] Main / Paging  01/05/2022 10:52 AM    Adin Hector

## 2022-01-05 NOTE — Anesthesia Postprocedure Evaluation (Signed)
Anesthesia Post Note  Patient: Deanna Fischer  Procedure(s) Performed: XI ROBOT ASSISTED APPENDECTOMY, LYSIS OF ADHESIONS, BILATERAL TAP BLOCK     Patient location during evaluation: PACU Anesthesia Type: General Level of consciousness: awake and alert Pain management: pain level controlled Vital Signs Assessment: post-procedure vital signs reviewed and stable Respiratory status: spontaneous breathing, nonlabored ventilation, respiratory function stable and patient connected to nasal cannula oxygen Cardiovascular status: blood pressure returned to baseline and stable Postop Assessment: no apparent nausea or vomiting Anesthetic complications: no   No notable events documented.  Last Vitals:  Vitals:   01/05/22 1400 01/05/22 1415  BP: 118/65 112/68  Pulse: 80 75  Resp: 12 12  Temp:  36.5 C  SpO2: 100% 95%    Last Pain:  Vitals:   01/05/22 1415  TempSrc:   PainSc: 5                  Aceyn Kathol L Denisia Harpole

## 2022-01-05 NOTE — Op Note (Signed)
PATIENT:  Deanna Fischer  61 y.o. female  Patient Care Team: Tawnya Crook, MD as PCP - General (Family Medicine) Michael Boston, MD as Consulting Physician (General Surgery) Daryel November, MD as Consulting Physician (Gastroenterology)  PRE-OPERATIVE DIAGNOSIS:  PELVIC ABSCESS. PROBABLE APPENDICITIS VERSUS SIGMOID DIVERTICULITIS  POST-OPERATIVE DIAGNOSIS:   CHRONIC APPENDICITIS WITH PRIOR RUPTURE HISTORY OF PELVIC ABSCESS  PROCEDURE:   ROBOTIC LYSIS OF ADHESIONS ROBOTIC APPENDECTOMY TRANSVERSUS ABDOMINIS PLANE (TAP) BLOCK - BILATERAL  SURGEON:  Adin Hector, MD  ASSISTANT: Leighton Ruff, MD, FACS, FASCRS An experienced assistant was required given the standard of surgical care given the complexity of the case.  This assistant was needed for exposure, dissection, suction, tissue approximation, retraction, perception, etc.  ANESTHESIA:   local and general  EBL:  Total I/O In: -  Out: 100 [Urine:75; Blood:25]  Delay start of Pharmacological VTE agent (>24hrs) due to surgical blood loss or risk of bleeding:  no  DRAINS: none   SPECIMEN:  APPENDIX  DISPOSITION OF SPECIMEN:  PATHOLOGY  COUNTS:  YES  PLAN OF CARE: Discharge to home after PACU  PATIENT DISPOSITION:  PACU - hemodynamically stable.   INDICATIONS: Patient worsening pain and found to have large abscess in pelvis.  Admitted with IV antibiotics and drainage placed.  Differential diagnosis include perforated appendicitis, Crohn's ileitis with rupture, sigmoid diverticulitis.  Eventually she stabilized and improved.  Underwent endoscopy.  No evidence of any sigmoid diverticulosis nor any ileitis.  Follow-up scans seem to show epicenter more around the appendix.  I recommended minimally invasive lysed adhesions with appendectomy, possible ileocolonic resection, possible need for sigmoid colectomy.    The anatomy & physiology of the digestive tract was discussed.  The pathophysiology of appendicitis was  discussed.  Natural history risks without surgery was discussed.   I feel the risks of no intervention will lead to serious problems that outweigh the operative risks; therefore, I recommended diagnostic laparoscopy with removal of appendix to remove the pathology.  Possible ileocolonic resection.  Possible en bloc sigmoid resection.  Robotic, laparoscopic & open techniques were discussed.   I noted a good likelihood this will help address the problem.    Risks such as bleeding, infection, abscess, leak, reoperation, possible ostomy, hernia, heart attack, death, and other risks were discussed.  Goals of post-operative recovery were discussed as well.  We will work to minimize complications.  Questions were answered.  The patient expresses understanding & wishes to proceed with surgery.  OR FINDINGS: Moderate volume of adhesions specially on liver dome and down the pelvis.  Worst in right lower quadrant and dome of the liver.  Left side clear.  Omentum in upper abdomen numerous interloop ileal small bowel adhesions.  No active abscess.  Appendix thickened and inflamed with dense interloop small bowel adhesions.  Middle part of the appendix very thinned out and disrupted consistent with hypothesis of mid appendiceal rupture / perforation.  No omentum nearby which would explain her significant chronic peritonitis and pelvic disease.  Terminal ileum and small intestine without any phlegmon creeping fat or ileitis.  No Meckel's diverticulum.  Sigmoid colon with some secondary inflammation and adhesions but no diverticula.  Not consistent with diverticulitis.  No mass or stricture there.  Pelvic adhesions soft especially to left adnexa.  Gallbladder, colon, stomach, liver and other viscera normal.    CASE DATA:  Type of patient?: Elective [facility] Private Case  Status of Case? Elective Scheduled  Infection Present At Time Of Surgery (PATOS)?  PHLEGMON  DESCRIPTION:   Informed consent was confirmed.  The  patient underwent general anaesthesia without difficulty.  The patient was positioned appropriately.  VTE prevention in place.  The patient's abdomen was clipped, prepped, & draped in a sterile fashion.  Surgical timeout confirmed our plan.  Peritoneal entry with a laparoscopic port was obtained using Varess spring needle entry technique in the left upper abdomen as the patient was positioned in reverse Trendelenburg.  I induced carbon dioxide insufflation.  No change in end tidal CO2 measurements.  Full symmetrical abdominal distention.  Initial port was carefully placed.  Camera inspection revealed no injury.  Extra ports were carefully placed under direct laparoscopic visualization.   Upon entering the abdomen (organ space), I encountered numerous adhesions mostly cotton candy soft mesh over the dome liver and right side the abdomen and pelvis.  Freed the adhesions off the liver anteriorly and some the right side and folded more into the right lower quadrant with her moderate adhesions.  Freed numerous small bowel loops out of adhesions down to the pelvis to help roll the small intestine out of the pelvis.  Saw sigmoid colon with soft adhesions but no active perforation abscess.  I saw no diverticula.  Recent adhesions from the left adnexa to the sigmoid colon and confirm no deep abscess or any other abnormalities there.  I focused on the distal small bowel.  I chose an area in the mid small intestine that was optically inflamed and followed more distally.  Freed off numerous dense interloop adhesions finding a blind end tubular structure that was thickened with phlegmon.  Seem consistent with the appendix.  Eventually freed off interloop adhesions to and identify the ileocecal region.  Terminal ileum looked normal on the serosa without any inflammation.  Without creeping fat arguing against any ileitis.  Eventually worked to confirm that it was the appendix.  It was thickened at the tip but rather  obliterated centrally.  Freed the mesial pectus off the retroperitoneum.  We mobilized the distal ileum to proximal ascending colon in a lateral to medial fashion.  I took care to avoid injuring any retroperitoneal structures.   We freed the appendix off its attachments to the ascending colon and cecal mesentery.  I elevated the appendix. I freed the mesoappendix off the ileocecal region and transected through it.  While the mesoappendix was rather obliterated I could identify the base of the appendix which was still viable.  I skeletonized the base the appendix and cecum circumferentially.  I stapled the appendix off the cecum using a laparoscopic stapler. I took a healthy cuff of viable cecum.  Had to do a second firing given some of the thickening and redundancy of the cecum.  We placed the appendix inside an EcoSac bag and removed out the 21m stapler port.  We did copious irrigation.  I then did lyse lesions of the small intestine from the ileocecal valve proximally to the ligament of Treitz.  Most of the omentum been freed off.  Did meticulous inspection to prove no serosal injury or other abnormality.  Upper two thirds of the small intestine was noninflamed.  Labor transit normal.  Duodenal sweep stomach and gallbladder look normal as well.  FFreddrick Marchalso remaining adhesions tween the dome of the liver to the diaphragm and anterior abdominal wall did copious irrigation.  Was like a FRochele Raringsituation up there.  Irrigated and inspected the pelvis.  Confirmed the sigmoid colon serosa looked soft.  Only some secondary inflammation on  the sigmoid mesentery where the adhesions had been to the appendix.  Seems secondarily inflamed.  Again no diverticula.  No stricturing perforation or other concerns.  Adnexa underwhelming.  No undrained abscess.  Did copious irrigation.  Mobilizing greater omentum and positioned it to be down in the pelvis.    Because the area cleaned up well after irrigation, I did not  place a drain.  We undocked the robot and aspirated carbon oxide after positioning the omentum down towards the pelvis.  I closed the 12 mm stapler port site fascia with #1 PDS suture primarily.   I closed skin using 4-0 monocryl stitch.  Sterile dressings applied.  Patient was extubated and sent to the recovery room.   Because there was no active abscess nor severe peritonitis and she had been doing well prior to surgery, I felt was reasonable for be discharged home later today if she met goals.  Dr. Marcello Moores agreed.   I discussed operative findings, updated the patient's status, discussed probable steps to recovery, and gave postoperative recommendations to the patient's friend , Tyler Deis.  Recommendations were made.  Questions were answered.  She expressed understanding & appreciation.  Questions answered.  She expressed understanding and appreciation.  Adin Hector, M.D., F.A.C.S. Gastrointestinal and Minimally Invasive Surgery [facility] [address] [place], [place] [address] [phone] Main / Paging  01/05/2022 1:41 PM

## 2022-01-05 NOTE — Anesthesia Procedure Notes (Signed)
Procedure Name: Intubation Date/Time: 01/05/2022 12:04 PM  Performed by: Sharlette Dense, CRNAPre-anesthesia Checklist: Patient identified, Emergency Drugs available, Suction available and Patient being monitored Patient Re-evaluated:Patient Re-evaluated prior to induction Oxygen Delivery Method: Circle system utilized Preoxygenation: Pre-oxygenation with 100% oxygen Induction Type: IV induction Ventilation: Mask ventilation without difficulty and Oral airway inserted - appropriate to patient size Laryngoscope Size: Sabra Heck and 2 Grade View: Grade I Tube type: Oral Tube size: 7.0 mm Number of attempts: 1 Airway Equipment and Method: Stylet Placement Confirmation: ETT inserted through vocal cords under direct vision, positive ETCO2 and breath sounds checked- equal and bilateral Secured at: 19 cm Tube secured with: Tape Dental Injury: Teeth and Oropharynx as per pre-operative assessment

## 2022-01-05 NOTE — H&P (Signed)
01/05/2022   REFERRING PHYSICIAN: Self  Patient Care Team: None as PCP - General Deanna Fischer, Deanna Saran, MD as Consulting Provider (General Surgery) Deanna Paradise, MD (Gastroenterology)  PROVIDER: Hollace Kinnier, MD  [facility] MRN: [id number] DOB: 08/03/1960 DATE OF ENCOUNTER: 09/14/2021  SUBJECTIVE   Chief Complaint: Pelvic Abscess   History of Present Illness: Deanna Fischer is a 61 y.o. female who is seen today  as an office consultation at the request of Dr. Pasty Fischer  for evaluation of Pelvic Abscess .   Pleasant woman admitted with pelvic abscess with inflamed appendix small bowel and colon. Uncertain etiology. Possible appendicitis versus Crohn's ileitis. Sigmoid diverticulitis seems less likely. History of lupus on Plaquenil and other health issues. Has some depression anxiety and PTSD. Medical admission with surgical consultation. Underwent drainage and antibiotics. Grew E. coli. Resistant to ampicillin but sensitive to Augmentin and Cipro. Drain fell out but eventually improved. Discharged 08/29/2021 with close follow-up.  Patient comes today by herself. She is feeling better overall but does dip at some point and is not back to normal. She still struggles with some constipation and is needed to take MiraLAX to get her bowels moving. No rectal bleeding. Her appetites not normal but she is eating some. She is sneaking some boost shakes to help out. She is walking more. She definitely feels stronger. She is due to see gastroenterology next week, Deanna Fischer, to see if colonoscopy can done to sort things out. She is not a diabetic. She has never had a colonoscopy. Her mother did have diverticulitis. She used to smoke but has been abstinent from tobacco for a while  Medical History:  Past Medical History:  Diagnosis Date   Anemia   Patient Active Problem List  Diagnosis   Chronic fatigue   B12 deficiency   MDD (major depressive disorder), recurrent episode, moderate  (CMS-HCC)   Joint pain   Moderate mixed hyperlipidemia not requiring statin therapy   Hypothyroidism   Pelvic abscess in female   PTSD (post-traumatic stress disorder)   GAD (generalized anxiety disorder)   SLE (systemic lupus erythematosus related syndrome) (CMS-HCC)   History reviewed. No pertinent surgical history.   Allergies  Allergen Reactions   Augmentin [Amoxicillin-Pot Clavulanate] Diarrhea and Vomiting   Current Outpatient Medications on File Prior to Visit  Medication Sig Dispense Refill   escitalopram oxalate (LEXAPRO) 10 MG tablet escitalopram 10 mg tablet   hydrOXYchloroQUINE (PLAQUENIL) 200 mg tablet Take 1.5 tablets by mouth once daily   levothyroxine (SYNTHROID) 50 MCG tablet   traMADoL (ULTRAM) 50 mg tablet 1 tablet as needed   traZODone (DESYREL) 50 MG tablet Take 1 tablet by mouth at bedtime as needed   acetaminophen (TYLENOL) 650 MG ER tablet 2 tablets as needed   ALPRAZolam (XANAX) 0.5 MG tablet alprazolam 0.5 mg tablet   aspirin 81 MG EC tablet daily   buPROPion (WELLBUTRIN XL) 150 MG XL tablet bupropion HCl XL 150 mg 24 hr tablet, extended release   simvastatin (ZOCOR) 10 MG tablet Take 1 tablet by mouth every evening   No current facility-administered medications on file prior to visit.   Family History  Problem Relation Age of Onset   Hyperlipidemia (Elevated cholesterol) Mother   Diabetes Mother   Breast cancer Mother   Skin cancer Father   Coronary Artery Disease (Blocked arteries around heart) Father   Hyperlipidemia (Elevated cholesterol) Father   Breast cancer Sister    Social History   Tobacco Use  Smoking Status Former  Types: Cigarettes   Quit date: 2020   Years since quitting: 3.2  Smokeless Tobacco Never    Social History   Socioeconomic History   Marital status: Single  Tobacco Use   Smoking status: Former  Types: Cigarettes  Quit date: 2020  Years since quitting: 3.2   Smokeless tobacco: Never  Substance and Sexual  Activity   Alcohol use: Never   Drug use: Never   ############################################################  Review of Systems: A complete review of systems (ROS) was obtained from the patient. I have reviewed this information and discussed as appropriate with the patient. See HPI as well for other pertinent ROS.  Constitutional: No fevers, chills, sweats. Weight stable Eyes: No vision changes, No discharge HENT: No sore throats, nasal drainage Lymph: No neck swelling, No bruising easily Pulmonary: No cough, productive sputum CV: No orthopnea, PND Patient walks 30 minutes for about 2 miles without difficulty. No exertional chest/neck/shoulder/arm pain.  GI: No personal nor family history of GI/colon cancer, inflammatory bowel disease, irritable bowel syndrome, allergy such as Celiac Sprue, dietary/dairy problems, colitis, ulcers nor gastritis. No recent sick contacts/gastroenteritis. No travel outside the country. No changes in diet.  Renal: No UTIs, No hematuria Genital: No drainage, bleeding, masses Musculoskeletal: No severe joint pain. Good ROM major joints Skin: No sores or lesions Heme/Lymph: No easy bleeding. No swollen lymph nodes  OBJECTIVE   Vitals:  09/14/21 1137  BP: 114/70  Pulse: 83  Temp: 36.8 C (98.3 F)  SpO2: 97%  Weight: 55 kg (121 lb 3.2 oz)  Height: 165.1 cm (5' 5" )    Body mass index is 20.17 kg/m.  PHYSICAL EXAM:  Constitutional: Not cachectic. Hygeine adequate. Vitals signs as above.  Eyes: Pupils reactive, normal extraocular movements. Sclera nonicteric Neuro: CN II-XII intact. No major focal sensory defects. No major motor deficits. Lymph: No head/neck/groin lymphadenopathy Psych: No severe agitation. No severe anxiety. Judgment & insight Adequate, Oriented x4, HENT: Normocephalic, Mucus membranes moist. No thrush. Hearing: adequate Neck: Supple, No tracheal deviation. No obvious thyromegaly Chest: No pain to chest wall compression. Good  respiratory excursion. No audible wheezing CV: Pulses intact. regular. No major extremity edema Ext: No obvious deformity or contracture. Edema: Not present. No cyanosis Skin: No major subcutaneous nodules. Warm and dry Musculoskeletal: Severe joint rigidity not present. No obvious clubbing. No digital petechiae. Mobility: no assist device moving easily without restrictions  Abdomen: Flat Soft. Nondistended. Mild vague abdominal discomfort but no peritonitis nor guarding. Seems to be more focal in the right lower side. Less so on the left.[name] Hernia: Not present. Diastasis recti: Not present. No hepatomegaly. No splenomegaly.  Genital/Pelvic: Inguinal hernia: Not present. Inguinal lymph nodes: without lymphadenopathy nor hidradenitis.   Rectal: (Deferred)    ###################################################################  Labs, Imaging and Diagnostic Testing:  Located in 'Care Everywhere' section of Epic EMR chart  PRIOR [facility] NOTES:  Located in [place]' section of Epic EMR chart  SURGERY NOTES:  Not applicable  PATHOLOGY:  Not applicable  Assessment and Plan:  DIAGNOSES:  Diagnoses and all orders for this visit:  Pelvic abscess in female  SLE (systemic lupus erythematosus related syndrome) (CMS-HCC)  Other orders - ondansetron (ZOFRAN-ODT) 4 MG disintegrating tablet; Take 1 tablet (4 mg total) by mouth every 8 (eight) hours as needed for Nausea - ciprofloxacin HCl (CIPRO) 500 MG tablet; Take 1 tablet (500 mg total) by mouth 2 (two) times daily for 7 days - metroNIDAZOLE (FLAGYL) 500 MG tablet; Take 1 tablet (500 mg total) by  mouth 2 (two) times daily for 7 days - neomycin 500 mg tablet; Take 2 tablets (1,000 mg total) by mouth 3 (three) times daily for 3 doses SEE BOWEL PREP INSTRUCTIONS: Take 2 tablets at 2pm, 3pm, and 10pm the day prior to your colon operation. - metroNIDAZOLE (FLAGYL) 500 MG tablet; Take 2 tablets (1,000 mg total) by mouth 3 (three)  times daily for 3 doses SEE BOWEL PREP INSTRUCTIONS: Take 2 tablets at 2pm, 3pm, and 10pm the day prior to your colon operation.    ASSESSMENT/PLAN   My instinct is to plan an interval robotic proximal colectomy versus appendectomy. Consider sigmoid colectomy with appendectomy if this looks like sigmoid involved. - seems less likely by recent colonoscopy showing no diverticula  Pt delayed surgery & got recurrent inflammation - better after another round of PO ABx.  The anatomy & physiology of the digestive tract was discussed. The pathophysiology of the colon was discussed. Natural history risks without surgery was discussed. I feel the risks of no intervention will lead to serious problems that outweigh the operative risks; therefore, I recommended a partial colectomy to remove the pathology. Minimally invasive (Robotic/Laparoscopic) & open techniques were discussed.   Risks such as bleeding, infection, abscess, leak, reoperation, injury to other organs, need for repair of tissues / organs, possible ostomy, hernia, heart attack, stroke, death, and other risks were discussed. I noted a good likelihood this will help address the problem. Goals of post-operative recovery were discussed as well. Need for adequate nutrition, daily bowel regimen and healthy physical activity, to optimize recovery was noted as well. We will work to minimize complications. Educational materials were available as well. Questions were answered. Called & d/w pt many times.  The patient expresses understanding & wishes to proceed with surgery.   Adin Hector, MD, FACS, MASCRS Esophageal, Gastrointestinal & Colorectal Surgery Robotic and Minimally Invasive Surgery  [facility], [place]  [place]. [address] [place], [place] [address] [phone] Fax [phone] Main  CONTACT INFORMATION:  Weekday (9AM-5PM): Call [facility] main office at  [phone]  Weeknight (5PM-9AM) or Weekend/Holiday: Check [email or web address] (password " [email or web address]") for General Surgery [facility] coverage  (Please, do not use SecureChat as it is not reliable communication to operating surgeons for immediate patient care)    01/05/2022

## 2022-01-05 NOTE — Progress Notes (Signed)
Note: Portions of this report may have been transcribed using voice recognition software. A sincere effort was made to ensure accuracy; however, inadvertent computerized transcription errors may be present.   Any transcriptional errors that result from this process are unintentional.        Deanna Fischer  1961/03/15 [id number]  Patient Care Team: Tawnya Crook, MD as PCP - General (Family Medicine) Michael Boston, MD as Consulting Physician (General Surgery) Daryel November, MD as Consulting Physician (Gastroenterology)  Patient awake resting in PACU.  Denies much pain.  Nursing at bedside.  Explained operative findings.  Hopefully if she is feeling comfortable can leave later today.  She liked that idea.  She was very appreciative of care.    So far so good.  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   Systemic lupus erythematosus ([place]) 08/17/2021   Hypophosphatemia 08/17/2021   Acute appendicitis with perforation, generalized peritonitis, and abscess 08/17/2021   Enteritis 08/16/2021   PTSD (post-traumatic stress disorder) 04/24/2020    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   Hyperlipidemia    Hypothyroidism 08/17/2021   Lupus ([place])    Pneumonia    Last 15 years ago   PTSD (post-traumatic stress disorder) 04/24/2020    Systemic lupus erythematosus ([place]) 08/17/2021    Past Surgical History:  Procedure Laterality Date   TONSILLECTOMY     WISDOM TOOTH EXTRACTION      Social History   Socioeconomic History   Marital status: Single    Spouse name: Not on file   Number of children: Not on file   Years of education: Not on file   Highest education level: Not on file  Occupational History   Not on file  Tobacco Use   Smoking status: Some Days    Packs/day: 0.25    Years: 25.00    Total pack years: 6.25    Types: Cigarettes   Smokeless tobacco: Never  Vaping Use   Vaping Use: Never used  Substance and Sexual Activity   Alcohol use: Yes    Alcohol/week: 9.0 standard drinks of alcohol    Types: 9 Glasses of wine per week   Drug use: Never   Sexual activity: Not Currently    Birth control/protection: Post-menopausal  Other Topics Concern   Not on file  Social History Narrative   Not on file   Social Determinants of Health   Financial Resource Strain: Low Risk  (11/16/2021)   Overall Financial Resource Strain (CARDIA)    Difficulty of Paying Living Expenses: Not hard at all  Food Insecurity: No Food Insecurity (11/16/2021)   Hunger Vital Sign    Worried About Running Out of Food in the Last Year: Never true    Ran Out of Food in the Last Year: Never true  Transportation Needs: No Transportation Needs (11/16/2021)   PRAPARE - Transportation    Lack of  Transportation (Medical): No    Lack of Transportation (Non-Medical): No  Physical Activity: Inactive (11/16/2021)   Exercise Vital Sign    Days of Exercise per Week: 0 days    Minutes of Exercise per Session: 0 min  Stress: Stress Concern Present (11/16/2021)   Britt    Feeling of Stress : To some extent  Social Connections: Moderately Integrated (11/16/2021)   Social Connection and Isolation Panel [NHANES]    Frequency of Communication with Friends and Family: More than  three times a week    Frequency of Social Gatherings with Friends and Family: Once a week    Attends Religious Services: More than 4 times per year    Active Member of [employer] or Organizations: Yes    Attends Archivist Meetings: 1 to 4 times per year    Marital Status: Never married  Intimate Partner Violence: Not At Risk (11/16/2021)   Humiliation, Afraid, Rape, and Kick questionnaire    Fear of Current or Ex-Partner: No    Emotionally Abused: No    Physically Abused: No    Sexually Abused: No    Family History  Problem Relation Age of Onset   Depression Mother    Cancer Mother    Arthritis Mother    Breast cancer Mother    COPD Mother    Heart disease Father    Hyperlipidemia Father    Alcohol abuse Father    Depression Father    Coronary artery disease Father    Arrhythmia Father    Heart attack Father    Breast cancer Sister    Drug abuse Sister    Colon cancer Neg Hx    Esophageal cancer Neg Hx    Stomach cancer Neg Hx    Pancreatic cancer Neg Hx     Current Facility-Administered Medications  Medication Dose Route Frequency Provider Last Rate Last Admin   alvimopan (ENTEREG) capsule 12 mg  12 mg Oral On Call to OR Michael Boston, MD       bupivacaine liposome (EXPAREL) 1.3 % injection 266 mg  20 mL Infiltration Once Michael Boston, MD       bupivacaine liposome (EXPAREL) 1.3 % injection    PRN Michael Boston, MD   20 mL at 01/05/22 1323   bupivacaine-epinephrine (PF) (MARCAINE W/ EPI) 0.25% -1:200000 injection    PRN Michael Boston, MD   50 mL at 01/05/22 1323   celecoxib (CELEBREX) capsule 200 mg  200 mg Oral On Call to OR Michael Boston, MD       enoxaparin (LOVENOX) injection 40 mg  40 mg Subcutaneous Once Michael Boston, MD       fentaNYL (SUBLIMAZE) 50 MCG/ML injection            fentaNYL (SUBLIMAZE) injection 25-50 mcg  25-50 mcg Intravenous Q5 min PRN Woodrum, Chelsey L, MD   50 mcg at 01/05/22 1402   gabapentin (NEURONTIN) capsule 300 mg  300 mg Oral On  Call to OR Michael Boston, MD       lactated ringers infusion   Intravenous Continuous Ellender, Karyl Kinnier, MD 10 mL/hr at 01/05/22 1111 Restarted at 01/05/22 1151   lactated ringers irrigation solution    PRN Samuella Rasool, Remo Lipps, MD   1,000 mL at 01/05/22 1223   methocarbamol (ROBAXIN) tablet 1,000 mg  1,000 mg Oral Q6H PRN Michael Boston, MD       sodium chloride irrigation 0.9 %    PRN  Michael Boston, MD   1,000 mL at 01/05/22 1223   traMADol (ULTRAM) tablet 50-100 mg  50-100 mg Oral Q6H PRN Michael Boston, MD         Allergies  Allergen Reactions   Amoxicillin Nausea And Vomiting    Caused nausea, vomiting and diarrhea   Penicillins Nausea And Vomiting    BP 118/65 (BP Location: Left Arm)   Pulse 80   Temp 97.9 F (36.6 C)   Resp 12   Ht 5' 5"  (1.651 m)   Wt 53 kg   SpO2 100%   BMI 19.44 kg/m   CT ABDOMEN PELVIS W CONTRAST  Result Date: 12/17/2021 CLINICAL DATA:  Pelvis, female abscess N73.9 (ICD-10-CM) EXAM: CT ABDOMEN AND PELVIS WITH CONTRAST TECHNIQUE: Multidetector CT imaging of the abdomen and pelvis was performed using the standard protocol following bolus administration of intravenous contrast. RADIATION DOSE REDUCTION: This exam was performed according to the departmental dose-optimization program which includes automated exposure control, adjustment of the mA and/or kV according to patient size and/or use of iterative reconstruction technique. CONTRAST:  151m OMNIPAQUE IOHEXOL 300 MG/ML  SOLN COMPARISON:  09/07/2021 FINDINGS: Lower chest: No acute abnormality. Hepatobiliary: Liver remains mildly enlarged. Parenchymal attenuation within normal limits. There are a few tiny subcentimeter low-density lesions within the right hepatic lobe, too small to characterize. No intrahepatic biliary dilatation. Recanalized umbilical vein. Gallbladder within normal limits. No hyperdense gallstone. Pancreas: Unremarkable. No pancreatic ductal dilatation or surrounding inflammatory changes. Spleen: Normal  in size without focal abnormality. Adrenals/Urinary Tract: Adrenal glands are unremarkable. Kidneys are normal, without renal calculi, focal lesion, or hydronephrosis. Bladder is unremarkable. Stomach/Bowel: Bowel is well opacified with oral contrast. Circumferentially thickened loop of bowel at the base of the cecum is favored to represent distal ileum, although this could potentially represent a thick walled appendix (series 2, images 52-58). New adjacent walled-off fluid collection in the right lower quadrant with thin rim enhancement measuring 2.6 x 1.7 x 2.1 cm (series 2, image 56). Relative lack of adjacent fat stranding adjacent to this collection. No adjacent free fluid. No abnormally dilated loops of bowel to suggest obstruction. No additional site of focal bowel wall thickening is seen. Vascular/Lymphatic: Scattered aortoiliac atherosclerotic calcifications without aneurysm. No abdominopelvic lymphadenopathy. Reproductive: Uterus and bilateral adnexa are unremarkable. Other: No ascites.  No pneumoperitoneum.  No abdominal wall hernia. Musculoskeletal: Chronic mild superior endplate compression deformity of T12. No new or acute bony findings. IMPRESSION: 1. New 2.6 x 1.7 x 2.1 cm thin-walled peripherally enhancing fluid collection in the right lower quadrant. Relative lack of adjacent fat stranding or free fluid. This could represent a small seroma or abscess. 2. Circumferentially thickened loop of bowel at the base of the cecum is favored to represent distal ileum, although this could potentially represent a thick-walled appendix. Correlate for clinical signs of appendicitis. Aortic Atherosclerosis (ICD10-I70.0). Electronically Signed   By: NDavina PokeD.O.   On: 12/17/2021 11:57    SAdin Hector MD, FACS, MASCRS Esophageal, Gastrointestinal & Colorectal Surgery Robotic and Minimally Invasive Surgery  [id number] [place] [phone] [phone]  CONTACT INFORMATION:  Weekday (9AM-5PM): Call [facility] main office at [phone] Weeknight (5PM-9AM) or Weekend/Holiday: Check [email or web address] (password " [email or web address]") for General Surgery [facility] coverage  (Please, do not use SecureChat as it is not reliable communication to operating surgeons for immediate patient care)

## 2022-01-05 NOTE — Discharge Instructions (Signed)
SURGERY: POST OP INSTRUCTIONS (Surgery for small bowel obstruction, colon resection, etc)   ######################################################################  EAT Gradually transition to a high fiber diet with a fiber supplement over the next few days after discharge  WALK Walk an hour a day.  Control your pain to do that.    CONTROL PAIN Control pain so that you can walk, sleep, tolerate sneezing/coughing, go up/down stairs.  HAVE A BOWEL MOVEMENT DAILY Keep your bowels regular to avoid problems.  OK to try a laxative to override constipation.  OK to use an antidairrheal to slow down diarrhea.  Call if not better after 2 tries  CALL IF YOU HAVE PROBLEMS/CONCERNS Call if you are still struggling despite following these instructions. Call if you have concerns not answered by these instructions  ######################################################################   DIET Follow a light diet the first few days at home.  Start with a bland diet such as soups, liquids, starchy foods, low fat foods, etc.  If you feel full, bloated, or constipated, stay on a ful liquid or pureed/blenderized diet for a few days until you feel better and no longer constipated. Be sure to drink plenty of fluids every day to avoid getting dehydrated (feeling dizzy, not urinating, etc.). Gradually add a fiber supplement to your diet over the next week.  Gradually get back to a regular solid diet.  Avoid fast food or heavy meals the first week as you are more likely to get nauseated. It is expected for your digestive tract to need a few months to get back to normal.  It is common for your bowel movements and stools to be irregular.  You will have occasional bloating and cramping that should eventually fade away.  Until you are eating solid food normally, off all pain medications, and back to regular activities; your bowels will not be normal. Focus on eating a low-fat, high fiber diet the rest of your life  (See Getting to [place], below).  CARE of your INCISION or WOUND  It is good for closed incisions and even open wounds to be washed every day.  Shower every day.  Short baths are fine.  Wash the incisions and wounds clean with soap & water.    You may leave closed incisions open to air if it is dry.   You may cover the incision with clean gauze & replace it after your daily shower for comfort.  TEGADERM:  You have clear gauze band-aid dressings over your closed incision(s).  Remove the dressings 3 days after surgery.    If you have an open wound with a wound vac, see wound vac care instructions.    ACTIVITIES as tolerated Start light daily activities --- self-care, walking, climbing stairs-- beginning the day after surgery.  Gradually increase activities as tolerated.  Control your pain to be active.  Stop when you are tired.  Ideally, walk several times a day, eventually an hour a day.   Most people are back to most day-to-day activities in a few weeks.  It takes 4-8 weeks to get back to unrestricted, intense activity. If you can walk 30 minutes without difficulty, it is safe to try more intense activity such as jogging, treadmill, bicycling, low-impact aerobics, swimming, etc. Save the most intensive and strenuous activity for last (Usually 4-8 weeks after surgery) such as sit-ups, heavy lifting, contact sports, etc.  Refrain from any intense heavy lifting or straining until you are off narcotics for pain control.  You will have off days, but  things should improve week-by-week. DO NOT PUSH THROUGH PAIN.  Let pain be your guide: If it hurts to do something, don't do it.  Pain is your body warning you to avoid that activity for another week until the pain goes down. You may drive when you are no longer taking narcotic prescription pain medication, you can comfortably wear a seatbelt, and you can safely make sudden turns/stops to protect yourself without hesitating due to pain. You may  have sexual intercourse when it is comfortable. If it hurts to do something, stop.  MEDICATIONS Take your usually prescribed home medications unless otherwise directed.   Blood thinners:  Usually you can restart any strong blood thinners after the second postoperative day.  It is OK to take aspirin right away.     If you are on strong blood thinners (warfarin/Coumadin, Plavix, Xerelto, Eliquis, Pradaxa, etc), discuss with your surgeon, medicine PCP, and/or cardiologist for instructions on when to restart the blood thinner & if blood monitoring is needed (PT/INR blood check, etc).     PAIN CONTROL Pain after surgery or related to activity is often due to strain/injury to muscle, tendon, nerves and/or incisions.  This pain is usually short-term and will improve in a few months.  To help speed the process of healing and to get back to regular activity more quickly, DO THE FOLLOWING THINGS TOGETHER: Increase activity gradually.  DO NOT PUSH THROUGH PAIN Use Ice and/or Heat Try Gentle Massage and/or Stretching Take over the counter pain medication Take Narcotic prescription pain medication for more severe pain  Good pain control = faster recovery.  It is better to take more medicine to be more active than to stay in bed all day to avoid medications.  Increase activity gradually Avoid heavy lifting at first, then increase to lifting as tolerated over the next 6 weeks. Do not "push through" the pain.  Listen to your body and avoid positions and maneuvers than reproduce the pain.  Wait a few days before trying something more intense Walking an hour a day is encouraged to help your body recover faster and more safely.  Start slowly and stop when getting sore.  If you can walk 30 minutes without stopping or pain, you can try more intense activity (running, jogging, aerobics, cycling, swimming, treadmill, sex, sports, weightlifting, etc.) Remember: If it hurts to do it, then don't do it! Use Ice and/or  Heat You will have swelling and bruising around the incisions.  This will take several weeks to resolve. Ice packs or heating pads (6-8 times a day, 30-60 minutes at a time) will help sooth soreness & bruising. Some people prefer to use ice alone, heat alone, or alternate between ice & heat.  Experiment and see what works best for you.  Consider trying ice for the first few days to help decrease swelling and bruising; then, switch to heat to help relax sore spots and speed recovery. Shower every day.  Short baths are fine.  It feels good!  Keep the incisions and wounds clean with soap & water.   Try Gentle Massage and/or Stretching Massage at the area of pain many times a day Stop if you feel pain - do not overdo it Take over the counter pain medication This helps the muscle and nerve tissues become less irritable and calm down faster Choose ONE of the following over-the-counter anti-inflammatory medications: Acetaminophen 582m tabs (Tylenol) 1-2 pills with every meal and just before bedtime (avoid if you have liver problems or  if you have acetaminophen in you narcotic prescription) Naproxen 276m tabs (ex. Aleve, Naprosyn) 1-2 pills twice a day (avoid if you have kidney, stomach, IBD, or bleeding problems) Ibuprofen 2055mtabs (ex. Advil, Motrin) 3-4 pills with every meal and just before bedtime (avoid if you have kidney, stomach, IBD, or bleeding problems) Take with food/snack several times a day as directed for at least 2 weeks to help keep pain / soreness down & more manageable. Take Narcotic prescription pain medication for more severe pain A prescription for strong pain control is often given to you upon discharge (for example: oxycodone/Percocet, hydrocodone/Norco/Vicodin, or tramadol/Ultram) Take your pain medication as prescribed. Be mindful that most narcotic prescriptions contain Tylenol (acetaminophen) as well - avoid taking too much Tylenol. If you are having problems/concerns with  the prescription medicine (does not control pain, nausea, vomiting, rash, itching, etc.), please call [phone] see if we need to switch you to a different pain medicine that will work better for you and/or control your side effects better. If you need a refill on your pain medication, you must call the office before 4 pm and on weekdays only.  By federal law, prescriptions for narcotics cannot be called into a pharmacy.  They must be filled out on paper & picked up from our office by the patient or authorized caretaker.  Prescriptions cannot be filled after 4 pm nor on weekends.    WHEN TO CALL [phone] uncontrolled or worsening pain  Fever over 101 F (38.5 C) Concerns with the incision: Worsening pain, redness, rash/hives, swelling, bleeding, or drainage Reactions / problems with new medications (itching, rash, hives, nausea, etc.) Nausea and/or vomiting Difficulty urinating Difficulty breathing Worsening fatigue, dizziness, lightheadedness, blurred vision Other concerns If you are not getting better after two weeks or are noticing you are getting worse, contact our office [phone] for further advice.  We may need to adjust your medications, re-evaluate you in the office, send you to the emergency room, or see what other things we can do to help. The clinic staff is available to answer your questions during regular business hours (8:30am-5pm).  Please don't hesitate to call and ask to speak to one of our nurses for clinical concerns.    A surgeon from [facility] is always on call at the hospitals 24 hours/day If you have a medical emergency, go to the nearest emergency room or call 911.  FOLLOW UP in our office One the day of your discharge from the hospital (or the next business weekday), please call [facility] to set up or confirm an appointment to see your surgeon in the office for a follow-up appointment.  Usually it is 2-3 weeks  after your surgery.   If you have skin staples at your incision(s), let the office know so we can set up a time in the office for the nurse to remove them (usually around 10 days after surgery). Make sure that you call for appointments the day of discharge (or the next business weekday) from the hospital to ensure a convenient appointment time. IF YOU HAVE DISABILITY OR FAMILY LEAVE FORMS, BRING THEM TO THE OFFICE FOR PROCESSING.  DO NOT GIVE THEM TO YOUR DOCTOR.  [facility] [address]  [id number] ([phone] Main [phone] [place] ([phone] Fax [email or web address]    GETTING TO GOOD BOWEL HEALTH. It is expected for your  digestive tract to need a few months to get back to normal.  It is common for your bowel movements and stools to be irregular.  You will have occasional bloating and cramping that should eventually fade away.  Until you are eating solid food normally, off all pain medications, and back to regular activities; your bowels will not be normal.   Avoiding constipation The goal: ONE SOFT BOWEL MOVEMENT A DAY!    Drink plenty of fluids.  Choose water first. TAKE A FIBER SUPPLEMENT EVERY DAY THE REST OF YOUR LIFE During your first week back home, gradually add back a fiber supplement every day Experiment which form you can tolerate.   There are many forms such as powders, tablets, wafers, gummies, etc Psyllium bran (Metamucil), methylcellulose (Citrucel), Miralax or Glycolax, Benefiber, Flax Seed.  Adjust the dose week-by-week (1/2 dose/day to 6 doses a day) until you are moving your bowels 1-2 times a day.  Cut back the dose or try a different fiber product if it is giving you problems such as diarrhea or bloating. Sometimes a laxative is needed to help jump-start bowels if constipated until the fiber supplement can help regulate your bowels.  If you are tolerating eating & you are farting, it  is okay to try a gentle laxative such as double dose MiraLax, prune juice, or Milk of Magnesia.  Avoid using laxatives too often. Stool softeners can sometimes help counteract the constipating effects of narcotic pain medicines.  It can also cause diarrhea, so avoid using for too long. If you are still constipated despite taking fiber daily, eating solids, and a few doses of laxatives, call our office. Controlling diarrhea Try drinking liquids and eating bland foods for a few days to avoid stressing your intestines further. Avoid dairy products (especially milk & ice cream) for a short time.  The intestines often can lose the ability to digest lactose when stressed. Avoid foods that cause gassiness or bloating.  Typical foods include beans and other legumes, cabbage, broccoli, and dairy foods.  Avoid greasy, spicy, fast foods.  Every person has some sensitivity to other foods, so listen to your body and avoid those foods that trigger problems for you. Probiotics (such as active yogurt, Align, etc) may help repopulate the intestines and colon with normal bacteria and calm down a sensitive digestive tract Adding a fiber supplement gradually can help thicken stools by absorbing excess fluid and retrain the intestines to act more normally.  Slowly increase the dose over a few weeks.  Too much fiber too soon can backfire and cause cramping & bloating. It is okay to try and slow down diarrhea with a few doses of antidiarrheal medicines.   Bismuth subsalicylate (ex. Kayopectate, Pepto Bismol) for a few doses can help control diarrhea.  Avoid if pregnant.   Loperamide (Imodium) can slow down diarrhea.  Start with one tablet (59m) first.  Avoid if you are having fevers or severe pain.  ILEOSTOMY PATIENTS WILL HAVE CHRONIC DIARRHEA since their colon is not in use.    Drink plenty of liquids.  You will need to drink even more glasses of water/liquid a day to avoid getting dehydrated. Record output from your  ileostomy.  Expect to empty the bag every 3-4 hours at first.  Most people with a permanent ileostomy empty their bag 4-6 times at the least.   Use antidiarrheal medicine (especially Imodium) several times a day to avoid getting dehydrated.  Start with a dose at bedtime & breakfast.  Adjust up or down as needed.  Increase antidiarrheal medications as directed to avoid emptying the bag more than 8 times a day (every 3 hours). Work with your wound ostomy nurse to learn care for your ostomy.  See ostomy care instructions. TROUBLESHOOTING IRREGULAR BOWELS 1) Start with a soft & bland diet. No spicy, greasy, or fried foods.  2) Avoid gluten/wheat or dairy products from diet to see if symptoms improve. 3) Miralax 17gm or flax seed mixed in [place]. water or juice-daily. May use 2-4 times a day as needed. 4) Gas-X, Phazyme, etc. as needed for gas & bloating.  5) Prilosec (omeprazole) over-the-counter as needed 6)  Consider probiotics (Align, Activa, etc) to help calm the bowels down  Call your doctor if you are getting worse or not getting better.  Sometimes further testing (cultures, endoscopy, X-ray studies, CT scans, bloodwork, etc.) may be needed to help diagnose and treat the cause of the diarrhea. [facility], [place], [place], [place], [place]  [address] [phone] - Main.    [phone]  - Toll Free.   [phone] - Fax [email or web address]

## 2022-01-07 LAB — SURGICAL PATHOLOGY

## 2022-01-07 NOTE — Progress Notes (Signed)
GI Tumor Board patient referral:   Deanna Fischer  1960-07-05 [id number]  CARE TEAM: Patient Care Team: Tawnya Crook, MD as PCP - General (Family Medicine) Michael Boston, MD as Consulting Physician (General Surgery) Daryel November, MD as Consulting Physician (Gastroenterology)  Diagnosis: Low-grade appendiceal mucinous neoplasm.  History of perforated appendicitis.  MD Care Team:   Michael Boston, MD - surgery Dustin Flock, MD, gastroenterology  Focus of discussion: Radiology & Pathology reviews - comprehensive   Please send to GI Tumor Coordinator Ihor Gully)  in [place] message and attach the medical record to it

## 2022-01-09 ENCOUNTER — Encounter: Payer: Self-pay | Admitting: Plastic Surgery

## 2022-01-09 NOTE — Telephone Encounter (Signed)
Left message for patient to call the office for a change in the appointment.

## 2022-01-12 ENCOUNTER — Other Ambulatory Visit: Payer: Self-pay

## 2022-01-12 NOTE — Progress Notes (Signed)
The proposed treatment discussed in conference is for discussion purpose only and is not a binding recommendation.  The patients have not been physically examined, or presented with their treatment options.  Therefore, final treatment plans cannot be decided.  

## 2022-01-24 ENCOUNTER — Other Ambulatory Visit: Payer: Self-pay | Admitting: Family Medicine

## 2022-01-26 ENCOUNTER — Institutional Professional Consult (permissible substitution): Payer: Medicare Other | Admitting: Plastic Surgery

## 2022-01-31 ENCOUNTER — Ambulatory Visit: Payer: Medicare Other | Admitting: Family Medicine

## 2022-02-07 ENCOUNTER — Encounter: Payer: Self-pay | Admitting: Family Medicine

## 2022-02-07 ENCOUNTER — Telehealth (INDEPENDENT_AMBULATORY_CARE_PROVIDER_SITE_OTHER): Payer: Medicare Other | Admitting: Family Medicine

## 2022-02-07 ENCOUNTER — Telehealth: Payer: Self-pay | Admitting: Family Medicine

## 2022-02-07 DIAGNOSIS — E559 Vitamin D deficiency, unspecified: Secondary | ICD-10-CM

## 2022-02-07 DIAGNOSIS — Z8781 Personal history of (healed) traumatic fracture: Secondary | ICD-10-CM | POA: Diagnosis not present

## 2022-02-07 DIAGNOSIS — F418 Other specified anxiety disorders: Secondary | ICD-10-CM

## 2022-02-07 DIAGNOSIS — L989 Disorder of the skin and subcutaneous tissue, unspecified: Secondary | ICD-10-CM | POA: Diagnosis not present

## 2022-02-07 DIAGNOSIS — E7800 Pure hypercholesterolemia, unspecified: Secondary | ICD-10-CM

## 2022-02-07 DIAGNOSIS — E538 Deficiency of other specified B group vitamins: Secondary | ICD-10-CM | POA: Diagnosis not present

## 2022-02-07 MED ORDER — ESCITALOPRAM OXALATE 20 MG PO TABS
20.0000 mg | ORAL_TABLET | Freq: Every day | ORAL | 3 refills | Status: DC
Start: 2022-02-07 — End: 2023-06-23

## 2022-02-07 MED ORDER — BUPROPION HCL ER (XL) 150 MG PO TB24: 150.0000 mg | ORAL_TABLET | Freq: Every day | ORAL | 3 refills | Status: DC

## 2022-02-07 NOTE — Telephone Encounter (Signed)
Patient states:  - She was instructed to schedule a bone density following VV with PCP - She called the number provided and informed them she needed to have a DG DXA Body composition completed as written on her AVS.  - States she was told they do not offer this type of test at this location  Patient requests:  - Confirmation on if the right test was ordered or not  - A callback with additional information if test needs to be completed elsewhere

## 2022-02-07 NOTE — Addendum Note (Signed)
Addended by: Wellington Hampshire on: 02/07/2022 04:35 PM   Modules accepted: Orders

## 2022-02-07 NOTE — Patient Instructions (Signed)
Great talking to you Schedule your annual  Call [phone] to schedule the bone density

## 2022-02-07 NOTE — Progress Notes (Signed)
MyChart Video Visit    Virtual Visit via Video Note   This visit type was conducted due to national recommendations for restrictions regarding the COVID-19 Pandemic (e.g. social distancing) in an effort to limit this patient's exposure and mitigate transmission in our community. This patient is at least at moderate risk for complications without adequate follow up. This format is felt to be most appropriate for this patient at this time. Physical exam was limited by quality of the video and audio technology used for the visit. CMA was able to get the patient set up on a video visit.  Patient location: Home. Patient and provider in visit Provider location: Office  I discussed the limitations of evaluation and management by telemedicine and the availability of in person appointments. The patient expressed understanding and agreed to proceed.  Visit Date: 02/07/2022  Today's healthcare provider: Wellington Hampshire, MD     Subjective:    Patient ID: Deanna Fischer, female    DOB: 1961-01-01, 61 y.o.   MRN: [id number]  Chief Complaint  Patient presents with   Referral    Would like referral to dermatology for skin and referral to get bone density      HPI  Fx shoulder R many months ago when fell, fx L foot in [place] stood up.  Saw pod.  H/o Lupus and on and off steroids.  Former smoker.  Wt <130.  Caucasian female and former ETOH.   H/o anorexia in HS. Taking calcium/D.   Flakey patch on l cheek-intermitt.  Wants to see derm.  Keloid scars.  B12 and d low-taking supps.  Was on B12 shots, but then busy and not do HLD-was on simvastatin but didn't like and working more on diet.  Depression/anxiety-well controlled on wellbutrin 150 and lexapro 20.  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     BIOTIN PO Take 1 tablet by mouth every morning.     Calcium Carbonate-Vitamin D (CALCIUM-D PO) Take 2 tablets by mouth every morning.     hydroxychloroquine (PLAQUENIL) 200 MG tablet TAKE 1 AND 1/2 TABLETS BY MOUTH EVERY DAY (Patient taking differently: Take 200 mg by mouth every morning.) 135 tablet 0   ibuprofen (ADVIL) 200 MG tablet Take 400-800 mg by mouth every 6 (six) hours as needed (pain).     levothyroxine (SYNTHROID) 50 MCG tablet Take 50 mcg by mouth daily before breakfast.     Multiple Vitamin (MULTIVITAMIN WITH MINERALS) TABS tablet Take 2 tablets by mouth every morning. Plant based multi vitamin     ondansetron (ZOFRAN ODT) 4 MG disintegrating tablet Take 1 tablet (4 mg total) by mouth every 8 (eight) hours as needed for nausea or vomiting. 20 tablet 0   Propylene Glycol, PF, (SYSTANE COMPLETE PF) 0.6 % SOLN Place 1 drop into both  eyes 5 (five) times daily as needed (dry eyes).     Saccharomyces boulardii (FLORASTOR PO) Take 2 capsules by mouth every morning.     traZODone (DESYREL) 50 MG tablet Take 50 mg by mouth at bedtime as needed for sleep.     buPROPion (WELLBUTRIN XL) 300 MG 24 hr tablet Take 150 mg by mouth every morning.     ALPRAZolam (XANAX) 0.5 MG tablet Take 0.5 mg by mouth at bedtime as needed for anxiety (for anxiety). Patient took 01/05/22 at 5am     ciprofloxacin (CIPRO) 500 MG tablet Take 500 mg by mouth 2 (two) times daily. (Patient not taking: Reported on 02/07/2022)     escitalopram (LEXAPRO) 10 MG tablet TAKE 2 TABLETS(20 MG) BY MOUTH EVERY MORNING 60 tablet 0    escitalopram (LEXAPRO) 20 MG tablet  (Patient not taking: Reported on 02/07/2022)     metroNIDAZOLE (FLAGYL) 500 MG tablet Take 500 mg by mouth 3 (three) times daily.     traMADol (ULTRAM) 50 MG tablet Take 1-2 tablets (50-100 mg total) by mouth every 6 (six) hours as needed for moderate pain or severe pain. 20 tablet 0   No facility-administered medications prior to visit.    Allergies  Allergen Reactions   Amoxicillin Nausea And Vomiting    Caused nausea, vomiting and diarrhea   Penicillins Nausea And Vomiting        Objective:     Physical Exam  Vitals and nursing note reviewed.  Constitutional:      General:  is not in acute distress.    Appearance: Normal appearance.  HENT:     Head: Normocephalic.  Pulmonary:     Effort: No respiratory distress.  Skin:    General: Skin is dry.     Coloration: Skin is not pale.  Neurological:     Mental Status: Pt is alert and oriented to person, place, and time.  Psychiatric:        Mood and Affect: Mood normal.   There were no vitals taken for this visit.  Wt Readings from Last 3 Encounters:  01/05/22 116 lb 13.5 oz (53 kg)  12/23/21 118 lb 3.2 oz (53.6 kg)  10/18/21 115 lb (52.2 kg)       Assessment & Plan:   Problem List Items Addressed This Visit       Other   Depression with anxiety - Primary   Relevant Medications   buPROPion (WELLBUTRIN XL) 150 MG 24 hr tablet   escitalopram (LEXAPRO) 20 MG tablet   B12 deficiency   Vitamin D deficiency   Other Visit Diagnoses     Skin lesion       History of fracture of right shoulder       Pure hypercholesterolemia          Anxiety/depression-chronic.  Well controlled.  Cont wellbutrin xl 150 and lexapro 36m. B12 deficiency-chronic. on injections in past but time issue.  Sch annual and will do labs, etc.  Also, recent partial colectomy do may contribute in future as well.  Vitamin D deficiency-chronic. Cont supps.  Will check levels at annual Skin lesion-pt will  call derm HLD-chronic.  Was on meds in past, but not now.  Working on diet/exercise.  Will check at annual H/o fx R shoulder after a fall and then L foot-no injury.  Check DXA.  Pt willing to do meds.   Meds ordered this encounter  Medications   buPROPion (WELLBUTRIN XL) 150 MG 24 hr tablet  Sig: Take 1 tablet (150 mg total) by mouth daily.    Dispense:  90 tablet    Refill:  3   escitalopram (LEXAPRO) 20 MG tablet    Sig: Take 1 tablet (20 mg total) by mouth daily.    Dispense:  90 tablet    Refill:  3    I discussed the assessment and treatment plan with the patient. The patient was provided an opportunity to ask questions and all were answered. The patient agreed with the plan and demonstrated an understanding of the instructions.   The patient was advised to call back or seek an in-person evaluation if the symptoms worsen or if the condition fails to improve as anticipated.  I provided 25 minutes of face-to-face time during this encounter.   Wellington Hampshire, MD [place] [phone] (phone) [phone] (fax)  [place]

## 2022-02-07 NOTE — Telephone Encounter (Signed)
Please advise 

## 2022-02-08 ENCOUNTER — Other Ambulatory Visit: Payer: Self-pay | Admitting: *Deleted

## 2022-02-08 NOTE — Telephone Encounter (Signed)
Noted  

## 2022-02-09 ENCOUNTER — Other Ambulatory Visit (HOSPITAL_BASED_OUTPATIENT_CLINIC_OR_DEPARTMENT_OTHER): Payer: Medicare Other

## 2022-02-10 ENCOUNTER — Ambulatory Visit (HOSPITAL_BASED_OUTPATIENT_CLINIC_OR_DEPARTMENT_OTHER)
Admission: RE | Admit: 2022-02-10 | Discharge: 2022-02-10 | Disposition: A | Discharge status: Home / Self Care | Payer: Medicare Other | Source: Ambulatory Visit | Attending: Family Medicine | Admitting: Family Medicine

## 2022-02-10 DIAGNOSIS — M8589 Other specified disorders of bone density and structure, multiple sites: Secondary | ICD-10-CM | POA: Diagnosis not present

## 2022-02-10 DIAGNOSIS — Z8781 Personal history of (healed) traumatic fracture: Secondary | ICD-10-CM | POA: Insufficient documentation

## 2022-02-10 DIAGNOSIS — Z780 Asymptomatic menopausal state: Secondary | ICD-10-CM | POA: Insufficient documentation

## 2022-02-10 DIAGNOSIS — E559 Vitamin D deficiency, unspecified: Secondary | ICD-10-CM | POA: Insufficient documentation

## 2022-02-10 DIAGNOSIS — Z13820 Encounter for screening for osteoporosis: Secondary | ICD-10-CM | POA: Insufficient documentation

## 2022-02-28 ENCOUNTER — Telehealth: Payer: Self-pay | Admitting: Family Medicine

## 2022-02-28 NOTE — Telephone Encounter (Signed)
FYI  Patient states: - She has been experiencing cough,fever, and body aches.  - Believes she has COVID and will be getting tested on 08/29 - She wanted to let PCP know she is sorry she has to cancel her physical until she know whether or not she has COVID  I offered patient to speak with triage nurse but she declined. States she will callback with results of COVID test.

## 2022-03-01 ENCOUNTER — Encounter: Payer: Medicare Other | Admitting: Family Medicine

## 2022-03-02 ENCOUNTER — Other Ambulatory Visit: Payer: Self-pay

## 2022-03-02 ENCOUNTER — Emergency Department (HOSPITAL_COMMUNITY): Payer: Medicare Other

## 2022-03-02 ENCOUNTER — Emergency Department (HOSPITAL_COMMUNITY)
Admission: EM | Admit: 2022-03-02 | Discharge: 2022-03-02 | Disposition: A | Discharge status: Home / Self Care | Payer: Medicare Other | Attending: Emergency Medicine | Admitting: Emergency Medicine

## 2022-03-02 ENCOUNTER — Encounter (HOSPITAL_COMMUNITY): Payer: Self-pay

## 2022-03-02 DIAGNOSIS — E039 Hypothyroidism, unspecified: Secondary | ICD-10-CM | POA: Insufficient documentation

## 2022-03-02 DIAGNOSIS — R9431 Abnormal electrocardiogram [ECG] [EKG]: Secondary | ICD-10-CM | POA: Diagnosis not present

## 2022-03-02 DIAGNOSIS — Z743 Need for continuous supervision: Secondary | ICD-10-CM | POA: Diagnosis not present

## 2022-03-02 DIAGNOSIS — E860 Dehydration: Secondary | ICD-10-CM | POA: Insufficient documentation

## 2022-03-02 DIAGNOSIS — R109 Unspecified abdominal pain: Secondary | ICD-10-CM | POA: Diagnosis not present

## 2022-03-02 DIAGNOSIS — R1011 Right upper quadrant pain: Secondary | ICD-10-CM | POA: Insufficient documentation

## 2022-03-02 LAB — CBC WITH DIFFERENTIAL/PLATELET
Abs Immature Granulocytes: 0.02 10*3/uL (ref 0.00–0.07)
Basophils Absolute: 0.1 10*3/uL (ref 0.0–0.1)
Basophils Relative: 1 %
Eosinophils Absolute: 0.1 10*3/uL (ref 0.0–0.5)
Eosinophils Relative: 2 %
HCT: 35.3 % — ABNORMAL LOW (ref 36.0–46.0)
Hemoglobin: 12.1 g/dL (ref 12.0–15.0)
Immature Granulocytes: 0 %
Lymphocytes Relative: 26 %
Lymphs Abs: 1.6 10*3/uL (ref 0.7–4.0)
MCH: 35.6 pg — ABNORMAL HIGH (ref 26.0–34.0)
MCHC: 34.3 g/dL (ref 30.0–36.0)
MCV: 103.8 fL — ABNORMAL HIGH (ref 80.0–100.0)
Monocytes Absolute: 0.7 10*3/uL (ref 0.1–1.0)
Monocytes Relative: 11 %
Neutro Abs: 3.7 10*3/uL (ref 1.7–7.7)
Neutrophils Relative %: 60 %
Platelets: 199 10*3/uL (ref 150–400)
RBC: 3.4 MIL/uL — ABNORMAL LOW (ref 3.87–5.11)
RDW: 12.2 % (ref 11.5–15.5)
WBC: 6.3 10*3/uL (ref 4.0–10.5)
nRBC: 0 % (ref 0.0–0.2)

## 2022-03-02 LAB — COMPREHENSIVE METABOLIC PANEL
ALT: 18 U/L (ref 0–44)
AST: 29 U/L (ref 15–41)
Albumin: 4 g/dL (ref 3.5–5.0)
Alkaline Phosphatase: 42 U/L (ref 38–126)
Anion gap: 17 — ABNORMAL HIGH (ref 5–15)
BUN: 10 mg/dL (ref 6–20)
CO2: 22 mmol/L (ref 22–32)
Calcium: 9.7 mg/dL (ref 8.9–10.3)
Chloride: 102 mmol/L (ref 98–111)
Creatinine, Ser: 0.61 mg/dL (ref 0.44–1.00)
GFR, Estimated: >60 mL/min (ref >60)
Glucose, Bld: 94 mg/dL (ref 70–99)
Potassium: 4.2 mmol/L (ref 3.5–5.1)
Sodium: 141 mmol/L (ref 135–145)
Total Bilirubin: 0.5 mg/dL (ref 0.3–1.2)
Total Protein: 6.7 g/dL (ref 6.5–8.1)

## 2022-03-02 LAB — I-STAT BETA HCG BLOOD, ED (MC, WL, AP ONLY): I-stat hCG, quantitative: <5 m[IU]/mL (ref <5)

## 2022-03-02 LAB — URINALYSIS, ROUTINE W REFLEX MICROSCOPIC
Bilirubin Urine: NEGATIVE
Glucose, UA: NEGATIVE mg/dL
Hgb urine dipstick: NEGATIVE
Ketones, ur: NEGATIVE mg/dL
Leukocytes,Ua: NEGATIVE
Nitrite: NEGATIVE
Protein, ur: NEGATIVE mg/dL
Specific Gravity, Urine: >1.046 — ABNORMAL HIGH (ref 1.005–1.030)
pH: 5 (ref 5.0–8.0)

## 2022-03-02 LAB — LIPASE, BLOOD: Lipase: 45 U/L (ref 11–51)

## 2022-03-02 MED ORDER — KETOROLAC TROMETHAMINE 15 MG/ML IJ SOLN
15.0000 mg | Freq: Once | INTRAMUSCULAR | Status: AC
  Administered 2022-03-02: 15 mg via INTRAVENOUS
  Filled 2022-03-02: qty 1

## 2022-03-02 MED ORDER — ONDANSETRON HCL 4 MG/2ML IJ SOLN
4.0000 mg | Freq: Once | INTRAMUSCULAR | Status: AC
Start: 2022-03-02 — End: 2022-03-02
  Administered 2022-03-02: 4 mg via INTRAVENOUS
  Filled 2022-03-02: qty 2

## 2022-03-02 MED ORDER — LIDOCAINE 5 % EX PTCH: 1.0000 | MEDICATED_PATCH | CUTANEOUS | 0 refills | Status: DC

## 2022-03-02 MED ORDER — FENTANYL CITRATE PF 50 MCG/ML IJ SOSY
50.0000 ug | PREFILLED_SYRINGE | Freq: Once | INTRAMUSCULAR | Status: AC
  Administered 2022-03-02: 50 ug via INTRAVENOUS
  Filled 2022-03-02: qty 1

## 2022-03-02 MED ORDER — LIDOCAINE 5 % EX PTCH
1.0000 | MEDICATED_PATCH | CUTANEOUS | Status: DC
  Administered 2022-03-02: 1 via TRANSDERMAL
  Filled 2022-03-02: qty 1

## 2022-03-02 MED ORDER — NAPROXEN 375 MG PO TABS: 375.0000 mg | ORAL_TABLET | Freq: Two times a day (BID) | ORAL | 0 refills | Status: DC

## 2022-03-02 MED ORDER — HYDROMORPHONE HCL 1 MG/ML IJ SOLN
1.0000 mg | Freq: Once | INTRAMUSCULAR | Status: AC
  Administered 2022-03-02: 1 mg via INTRAVENOUS
  Filled 2022-03-02: qty 1

## 2022-03-02 MED ORDER — SODIUM CHLORIDE 0.9 % IV BOLUS (SEPSIS)
1000.0000 mL | Freq: Once | INTRAVENOUS | Status: AC
  Administered 2022-03-02: 1000 mL via INTRAVENOUS

## 2022-03-02 MED ORDER — IOHEXOL 350 MG/ML SOLN
100.0000 mL | Freq: Once | INTRAVENOUS | Status: AC | PRN
Start: 2022-03-02 — End: 2022-03-02
  Administered 2022-03-02: 100 mL via INTRAVENOUS

## 2022-03-02 NOTE — ED Triage Notes (Signed)
Pt bib [facility] from home c/o of abdominal pain since yesterday that worsened overnight. Rebound tenderness noted to RUQ  BP 106/64 HR 78 NSR  SPO2 99% RA

## 2022-03-02 NOTE — ED Provider Notes (Signed)
[facility] EMERGENCY DEPARTMENT Provider Note   CSN: [id number] Arrival date & time: 03/02/22  6433     History  Chief Complaint  Patient presents with   Abdominal Pain    Deanna Fischer is a 61 y.o. female.  The history is provided by the patient.  Abdominal Pain Pain location:  RUQ Pain quality: sharp   Pain severity:  Severe Onset quality:  Sudden Timing:  Constant Progression:  Worsening Chronicity:  New Associated symptoms: no chest pain and no fever   Patient presents with abdominal pain.  She reports that she woke up with significant right upper quadrant abdominal pain.  At times it moves into her back.  Nausea but no vomiting.  No active chest pain, but it does hurt to breathe.  She reports recent normal bowel movements.   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 Authorizing Provider  Ascorbic Acid (VITAMIN C) 1000 MG tablet Take 1,000 mg by mouth every morning.    [provider]  BIOTIN PO Take 1 tablet by mouth every morning.    [provider]  buPROPion (WELLBUTRIN XL) 150 MG 24 hr tablet Take 1 tablet (150 mg total) by mouth daily. 02/07/22   Tawnya Crook, MD  Calcium Carbonate-Vitamin D (CALCIUM-D PO) Take 2 tablets by mouth every morning.    [provider]  escitalopram (LEXAPRO) 20 MG tablet Take 1 tablet (20 mg total) by mouth daily. 02/07/22   Tawnya Crook, MD   hydroxychloroquine (PLAQUENIL) 200 MG tablet TAKE 1 AND 1/2 TABLETS BY MOUTH EVERY DAY Patient taking differently: Take 200 mg by mouth every morning. 12/08/21   Tawnya Crook, MD  ibuprofen (ADVIL) 200 MG tablet Take 400-800 mg by mouth every 6 (six) hours as needed (pain).    [provider]  levothyroxine (SYNTHROID) 50 MCG tablet Take 50 mcg by mouth daily before breakfast.    [provider]  Multiple Vitamin (MULTIVITAMIN WITH MINERALS) TABS tablet Take 2 tablets by mouth every morning. Plant based multi vitamin    [provider]  ondansetron (ZOFRAN ODT) 4 MG disintegrating tablet Take 1 tablet (4 mg total) by mouth every 8 (eight) hours as needed for nausea or vomiting. 02/15/21   Claud Kelp, MD  Propylene Glycol, PF, (SYSTANE COMPLETE PF) 0.6 % SOLN Place 1 drop into both eyes 5 (five) times daily as needed (dry eyes).    [provider]  Saccharomyces boulardii (FLORASTOR PO) Take 2 capsules by mouth every morning.    [provider]  traZODone (DESYREL) 50 MG tablet Take 50 mg by mouth at bedtime as needed for sleep. 04/12/21   [provider]      Allergies    Amoxicillin and Penicillins    Review of Systems   Review of Systems  Constitutional:  Negative for fever.  Cardiovascular:  Negative for chest pain.  Gastrointestinal:  Positive for abdominal pain.    Physical Exam Updated Vital Signs BP 114/85 (BP Location: Right Arm)   Pulse 88   Temp 98.2 F (36.8 C) (Oral)   Resp 20   Ht 1.651 m (5' 5" )   Wt 55.3 kg   SpO2 95%   BMI 20.30 kg/m  Physical Exam CONSTITUTIONAL: Well developed/well nourished, uncomfortable appearing HEAD: Normocephalic/atraumatic EYES: EOMI/PERRL ENMT: Mucous membranes moist NECK: supple no meningeal signs CV: S1/S2 noted, no murmurs/rubs/gallops noted LUNGS: Lungs are clear to auscultation bilaterally, no apparent distress Chest-no tenderness noted to right lower costal margin, no  bruising or crepitus ABDOMEN: soft, moderate RUQ tenderness Well-healed scars noted lower abdomen NEURO: Pt is awake/alert/appropriate, moves all extremitiesx4.  No facial droop.   EXTREMITIES:full ROM SKIN: warm, color normal PSYCH: Anxious  ED Results / Procedures / Treatments   Labs (all labs ordered are listed, but only abnormal results are displayed) Labs Reviewed  CBC WITH DIFFERENTIAL/PLATELET - Abnormal; Notable for the following components:      Result Value   RBC 3.40 (*)    HCT 35.3 (*)    MCV 103.8 (*)    MCH 35.6 (*)    All other components within normal limits  COMPREHENSIVE METABOLIC PANEL - Abnormal; Notable for the following components:   Anion gap 17 (*)    All other components within normal limits  LIPASE, BLOOD  URINALYSIS, ROUTINE W REFLEX MICROSCOPIC  I-STAT BETA HCG BLOOD, ED (MC, [facility], AP ONLY)    EKG None  Radiology No results found.  Procedures Procedures    Medications Ordered in ED Medications  ondansetron (ZOFRAN) injection 4 mg (4 mg Intravenous Given 03/02/22 0703)  fentaNYL (SUBLIMAZE) injection 50 mcg (50 mcg Intravenous Given 03/02/22 0703)  sodium chloride 0.9 % bolus 1,000 mL (1,000 mLs Intravenous New Bag/Given 03/02/22 0708)    ED Course/ Medical Decision Making/ A&P Clinical Course as of 03/02/22 0739  Wed Mar 02, 2022  0654 Patient underwent appendectomy and lysis of adhesions back in July with Dr. Johney Maine.  She now has right upper quadrant abdominal pain which is new for her.  Plan to obtain imaging and labs [DW]  0734 RUQ, pending [place] [MK]  [phone] Signed out to Dr. Matilde Sprang with EKG, labs and ultrasound pending.  If ultrasound is negative, she may need CT abdomen pelvis [DW]    Clinical Course User Index [DW] Ripley Fraise, MD [MK] Kommor, Debe Coder, MD                           Medical Decision Making Amount and/or Complexity of Data Reviewed Radiology: ordered. ECG/medicine tests: ordered.  Risk Prescription drug  management.   This patient presents to the ED for concern of abdominal pain, this involves an extensive number of treatment options, and is a complaint that carries with it a high risk of complications and morbidity.  The differential diagnosis includes but is not limited to cholecystitis, cholelithiasis, pancreatitis, gastritis, peptic ulcer disease, , bowel obstruction, bowel perforation, diverticulitis, AAA, ischemic bowel    Comorbidities that complicate the patient evaluation: Patient's presentation is complicated by their history of recent surgical history   Additional history obtained: Records reviewed previous admission documents  Lab Tests: I Ordered, and personally interpreted labs.  The pertinent results include:  dehydration  Imaging Studies ordered: I ordered imaging studies including Ultrasound right upper quadrant       Medicines ordered and prescription drug management: I ordered medication  including fentanyl for pain    Complexity of problems addressed: Patient's presentation is most consistent with  acute complicated illness/injury requiring diagnostic workup          Final Clinical Impression(s) / ED Diagnoses Final diagnoses:  None    Rx / DC Orders ED Discharge Orders     None         Ripley Fraise, MD 03/02/22 [phone]

## 2022-03-02 NOTE — ED Provider Notes (Signed)
  Physical Exam  BP 114/85 (BP Location: Right Arm)   Pulse 88   Temp 98.2 F (36.8 C) (Oral)   Resp 20   Ht 5' 5"  (1.651 m)   Wt 55.3 kg   SpO2 95%   BMI 20.30 kg/m   Physical Exam Vitals and nursing note reviewed.  Constitutional:      General: She is not in acute distress.    Appearance: She is well-developed.  HENT:     Head: Normocephalic and atraumatic.  Eyes:     Conjunctiva/sclera: Conjunctivae normal.  Cardiovascular:     Rate and Rhythm: Normal rate and regular rhythm.     Heart sounds: No murmur heard. Pulmonary:     Effort: Pulmonary effort is normal. No respiratory distress.     Breath sounds: Normal breath sounds.  Abdominal:     Palpations: Abdomen is soft.     Tenderness: There is no abdominal tenderness. There is right CVA tenderness.  Musculoskeletal:        General: No swelling.     Cervical back: Neck supple.  Skin:    General: Skin is warm and dry.     Capillary Refill: Capillary refill takes less than 2 seconds.  Neurological:     Mental Status: She is alert.  Psychiatric:        Mood and Affect: Mood normal.     Procedures  Procedures  ED Course / MDM   Clinical Course as of 03/02/22 0813  Wed Mar 02, 2022  0654 Patient underwent appendectomy and lysis of adhesions back in July with Dr. Johney Maine.  She now has right upper quadrant abdominal pain which is new for her.  Plan to obtain imaging and labs [DW]  0734 RUQ, pending [place] [MK]  [phone] Signed out to Dr. Matilde Sprang with EKG, labs and ultrasound pending.  If ultrasound is negative, she may need CT abdomen pelvis [DW]    Clinical Course User Index [DW] Ripley Fraise, MD [MK] Caleigha Zale, Debe Coder, MD   Medical Decision Making Amount and/or Complexity of Data Reviewed Radiology: ordered. ECG/medicine tests: ordered.  Risk Prescription drug management.   Patient received in handoff.  Right upper quadrant pain and right flank pain pending ultrasound.  Plan is for CT abdomen pelvis if  ultrasound is unremarkable.  Ultrasound is unremarkable and a CT abdomen pelvis was obtained that is also reassuring with no acute pathology or explanation for the patient's pain.  Patient was given pain control with NSAIDs and a Lidoderm patch and pain did improve.  With negative imaging and work-up, suspect either musculoskeletal origin of this pain or early shingles prior to development of a rash.  Patient will be discharged with outpatient follow-up and a course of NSAIDs with Lidoderm with instructions to call her doctor if a rash develops in the dermatomal distribution.  Patient then discharged.       Teressa Lower, MD 03/02/22 1447

## 2022-03-03 ENCOUNTER — Telehealth: Payer: Self-pay | Admitting: Family Medicine

## 2022-03-03 ENCOUNTER — Other Ambulatory Visit: Payer: Self-pay | Admitting: Family Medicine

## 2022-03-03 MED ORDER — TRAZODONE HCL 50 MG PO TABS: 50.0000 mg | ORAL_TABLET | Freq: Every evening | ORAL | 2 refills | Status: DC | PRN

## 2022-03-03 NOTE — Telephone Encounter (Signed)
Patient called in pain, stated she wants traZODone (DESYREL) 50 MG tablet Patient was sent home from ED.-   Patient did not want to be triaged- patient would like pain meds to get though the night. Patient would like a call from Dr Cherlynn Kaiser

## 2022-03-04 ENCOUNTER — Encounter: Payer: Self-pay | Admitting: Family Medicine

## 2022-03-04 ENCOUNTER — Ambulatory Visit (INDEPENDENT_AMBULATORY_CARE_PROVIDER_SITE_OTHER): Payer: Medicare Other | Admitting: Family Medicine

## 2022-03-04 VITALS — BP 100/70 | HR 83 | Temp 98.4°F | Ht 65.0 in

## 2022-03-04 DIAGNOSIS — R0781 Pleurodynia: Secondary | ICD-10-CM | POA: Diagnosis not present

## 2022-03-04 MED ORDER — LIDOCAINE 5 % EX PTCH: 1.0000 | MEDICATED_PATCH | CUTANEOUS | 0 refills | Status: DC

## 2022-03-04 MED ORDER — OXYCODONE HCL 10 MG PO TABS: 10.0000 mg | ORAL_TABLET | Freq: Four times a day (QID) | ORAL | 0 refills | Status: DC | PRN

## 2022-03-04 NOTE — Progress Notes (Signed)
Subjective:     Patient ID: Deanna Fischer, female    DOB: [birth year], 61 y.o.   MRN: [id number]  Chief Complaint  Patient presents with   Follow-up    ED follow-up from 03/03/2022  Still having right flank pain     HPI RUQ abd pain. For few days.  Went to ED on 03/02/22-labs unremarkable, U/S gallbladder neg.  CT abd normal.  Got dilauded in ED so declined narcs, but "in agony".  Was told costochondritis.  Has had in past but "not this extreme".   Ice and compression helps.    No injury.       Has a large dog.  Thinks may be from walking dog.  Occ f/c last weekend.   No n/v/d/c.    Drank this am.   No SI.  Just tired of health problems.  Upset almost 60. "Exessentil crisis".   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     BIOTIN PO Take 1 tablet by mouth every morning.     buPROPion (WELLBUTRIN XL) 150 MG 24 hr tablet Take 1 tablet (150 mg total) by mouth daily. 90 tablet 3   Calcium Carbonate-Vitamin D (CALCIUM-D PO) Take 2 tablets by mouth every morning.     escitalopram (LEXAPRO) 20 MG tablet Take 1 tablet (20 mg total) by mouth daily. 90 tablet 3   hydroxychloroquine (PLAQUENIL) 200 MG tablet TAKE 1 AND 1/2 TABLETS BY MOUTH EVERY DAY (Patient taking differently:  Take 200 mg by mouth every morning.) 135 tablet 0   ibuprofen (ADVIL) 200 MG tablet Take 400-800 mg by mouth every 6 (six) hours as needed (pain).     levothyroxine (SYNTHROID) 50 MCG tablet Take 50 mcg by mouth daily before breakfast.     lidocaine (LIDODERM) 5 % Place 1 patch onto the skin daily. Remove & Discard patch within 12 hours or as directed by MD 30 patch 0   Multiple Vitamin (MULTIVITAMIN WITH MINERALS) TABS tablet Take 2 tablets by mouth every morning. Plant based multi vitamin     naproxen (NAPROSYN) 375 MG tablet Take 1 tablet (375 mg total) by mouth 2 (two) times daily. 20 tablet 0   ondansetron (ZOFRAN ODT) 4 MG disintegrating tablet Take 1 tablet (4 mg total) by mouth every 8 (eight) hours as needed for nausea or vomiting. 20 tablet 0   Propylene Glycol, PF, (SYSTANE COMPLETE PF) 0.6 % SOLN Place 1 drop into both eyes 5 (five) times daily as needed (dry eyes).     Saccharomyces boulardii (FLORASTOR PO) Take 2 capsules by mouth every morning.     traZODone (DESYREL) 50 MG tablet Take 1 tablet (50 mg total)  by mouth at bedtime as needed for sleep. 30 tablet 2   No facility-administered medications prior to visit.    Allergies  Allergen Reactions   Amoxicillin Nausea And Vomiting    Caused nausea, vomiting and diarrhea   Penicillins Nausea And Vomiting   ROS neg/noncontributory except as noted HPI/below      Objective:     BP 100/70   Pulse 83   Temp 98.4 F (36.9 C) (Temporal)   Ht 5' 5"  (1.651 m)   SpO2 99%   BMI 20.30 kg/m  Wt Readings from Last 3 Encounters:  03/02/22 122 lb (55.3 kg)  01/05/22 116 lb 13.5 oz (53 kg)  12/23/21 118 lb 3.2 oz (53.6 kg)    Physical Exam   Gen: WDWN NAD HEENT: NCAT, conjunctiva not injected, sclera nonicteric NECK:  supple, no thyromegaly, no nodes, no carotid bruits CARDIAC: RRR, S1S2+, no murmur. DP 2+B LUNGS: CTAB. No wheezes ABDOMEN:  BS+, soft, NTND, No HSM, no masses EXT:  no edema MSK: no gross abnormalities.    Very tender lower ribs R.  A bruise over lateral lower ribs.  NEURO: A&O x3.  CN II-XII intact.  PSYCH: crying. Good eye contact  speech sl loud/slurred  Reviewed ER records PDMP reviewed     Assessment & Plan:   Problem List Items Addressed This Visit   None Visit Diagnoses     Rib pain on right side    -  Primary      Rib pain on R-?costochondritis-ibuprofen,tylenol, oxycodone 38m q6-8 prn lidoderm patches.  Tylenol and ibuprofen as well  No orders of the defined types were placed in this encounter.   AWellington Hampshire MD

## 2022-03-04 NOTE — Patient Instructions (Signed)

## 2022-03-10 ENCOUNTER — Telehealth: Payer: Self-pay | Admitting: Family Medicine

## 2022-03-10 MED ORDER — HYDROCODONE-ACETAMINOPHEN 5-325 MG PO TABS: 1.0000 | ORAL_TABLET | Freq: Four times a day (QID) | ORAL | 0 refills | Status: DC | PRN

## 2022-03-10 NOTE — Telephone Encounter (Signed)
Pt states: -Experiencing pain in ribs still  -She is sleeping up right because ribs are too painful for reclining -Took 4 doses of the Oxycodone and it made her itch, she stopped using immediately "and tossed them" -She cannot get the pharmacies to find/fill the lidocaine patches -Naproxen worked a "teeny bit" -Ice is dulling the pain, but not eliminating -She thinks Dr. Johney Maine prescribed something that worked well after her surgery in July and did not make her itch, but cannot remember the name of medication.   Pt requests: -pain medication prescription to last one week at most so she can get rest and recover.    LOV: 03/04/22   Is patient out of medication: yes   Preferred [place], [place] - [address] AT [address] [facility]  [place], [place] [address]  Phone:  [phone]  Fax:  [phone]  DEA #:  [id number]

## 2022-03-10 NOTE — Addendum Note (Signed)
Addended by: Wellington Hampshire on: 03/10/2022 03:54 PM   Modules accepted: Orders

## 2022-03-11 NOTE — Telephone Encounter (Signed)
Please disregard last message

## 2022-03-11 NOTE — Telephone Encounter (Signed)
Please see pt msg awaiting triage note, called pt this morning regarding previous message about medication and lvm

## 2022-03-11 NOTE — Telephone Encounter (Signed)
Called pt to advise and lvm to return call to office at her convenience

## 2022-03-14 ENCOUNTER — Telehealth: Payer: Self-pay | Admitting: Family Medicine

## 2022-03-14 NOTE — Telephone Encounter (Signed)
Spoke with patient and she stated that ER had prescribed lidocaine patches, but her insurance denied it because she could get it over the counter. Patient stated she bought two boxes otc, not worried about doing the appeal. Nothing further needed at this time.

## 2022-03-14 NOTE — Telephone Encounter (Signed)
Medical appeal questions -   Therapist, music at [place] Client [facility] [place] [facility] [employer] Type Call Who Is Calling Patient / Member / Family / Caregiver Caller Name Nesha A. Caller Phone Number [phone] Patient Name Deanna Fischer Patient DOB 1960/07/10 Call Type Message Only Information Provided Reason for Call Request for General Office Information Initial Comment Caller states she is trying to questions answered on a medication appeal. Additional Comment Office hours provided

## 2022-03-15 ENCOUNTER — Institutional Professional Consult (permissible substitution): Payer: Medicare Other | Admitting: Plastic Surgery

## 2022-03-22 ENCOUNTER — Telehealth: Payer: Self-pay | Admitting: Gastroenterology

## 2022-03-22 NOTE — Telephone Encounter (Signed)
Hello Dr. Candis Schatz,   Patient is requesting a transfer of care over to Dr. Tarri Glenn (prefers female provider). Please advise            Thank you

## 2022-03-28 DIAGNOSIS — D373 Neoplasm of uncertain behavior of appendix: Secondary | ICD-10-CM | POA: Diagnosis not present

## 2022-03-29 NOTE — Telephone Encounter (Signed)
Called patient to schedule, dint answer left voice mail.

## 2022-03-29 NOTE — Telephone Encounter (Signed)
Hi Dr. Tarri Glenn,  Would accept the transfer of care request below? Please advise. Thanks

## 2022-03-30 DIAGNOSIS — Z880 Allergy status to penicillin: Secondary | ICD-10-CM | POA: Diagnosis not present

## 2022-03-30 DIAGNOSIS — D121 Benign neoplasm of appendix: Secondary | ICD-10-CM | POA: Diagnosis not present

## 2022-05-18 ENCOUNTER — Ambulatory Visit: Payer: Medicare Other | Admitting: Gastroenterology

## 2022-05-31 ENCOUNTER — Encounter: Payer: Medicare Other | Admitting: Surgical

## 2022-06-15 ENCOUNTER — Telehealth: Payer: Self-pay | Admitting: Family Medicine

## 2022-06-15 ENCOUNTER — Ambulatory Visit (HOSPITAL_COMMUNITY): Admission: AD | Admit: 2022-06-15 | Payer: Medicare Other | Source: Home / Self Care

## 2022-06-15 NOTE — Telephone Encounter (Signed)
Patient states: - Following medical struggles, she has had a huge blow to her mental health  - States she has been experiencing depression and needs referral to behavioral health - She is currently on antidepressants but believes they are the wrong ones - She is not having suicidal or homicidal ideations at all; Just wants to finally reach out/ admit she needs help   Patient requests: - A phone call  with PCP prior to vv on 06/17/22 to discuss getting a referral to behavioral health    I offered pt to speak to triage nurse but she declined. States she would really like to speak with PCP. Pt has been scheduled for 06/17/22 @ 4pm for video visit.

## 2022-06-15 NOTE — Telephone Encounter (Signed)
Please see message below

## 2022-06-15 NOTE — Telephone Encounter (Signed)
Patient notified and verbalized understanding. Patient stated that she had just talked to behavioral health and they stated that she needed to come as well.

## 2022-06-16 ENCOUNTER — Encounter: Payer: Self-pay | Admitting: *Deleted

## 2022-06-17 ENCOUNTER — Telehealth: Payer: Medicare Other | Admitting: Family Medicine

## 2022-06-30 ENCOUNTER — Other Ambulatory Visit: Payer: Self-pay | Admitting: Family Medicine

## 2022-06-30 DIAGNOSIS — Z1231 Encounter for screening mammogram for malignant neoplasm of breast: Secondary | ICD-10-CM

## 2022-06-30 DIAGNOSIS — Z803 Family history of malignant neoplasm of breast: Secondary | ICD-10-CM

## 2022-07-25 ENCOUNTER — Encounter: Payer: Medicare Other | Admitting: Family Medicine

## 2022-07-28 ENCOUNTER — Inpatient Hospital Stay: Admission: RE | Admit: 2022-07-28 | Payer: Medicare Other | Source: Ambulatory Visit

## 2022-08-01 DIAGNOSIS — K388 Other specified diseases of appendix: Secondary | ICD-10-CM | POA: Diagnosis not present

## 2022-08-01 DIAGNOSIS — C181 Malignant neoplasm of appendix: Secondary | ICD-10-CM | POA: Diagnosis not present

## 2022-08-03 DIAGNOSIS — M67912 Unspecified disorder of synovium and tendon, left shoulder: Secondary | ICD-10-CM | POA: Diagnosis not present

## 2022-08-03 DIAGNOSIS — M67911 Unspecified disorder of synovium and tendon, right shoulder: Secondary | ICD-10-CM | POA: Diagnosis not present

## 2022-08-03 DIAGNOSIS — M25512 Pain in left shoulder: Secondary | ICD-10-CM | POA: Diagnosis not present

## 2022-08-03 DIAGNOSIS — M25532 Pain in left wrist: Secondary | ICD-10-CM | POA: Diagnosis not present

## 2022-08-08 ENCOUNTER — Telehealth: Payer: Self-pay | Admitting: Family Medicine

## 2022-08-08 DIAGNOSIS — Z803 Family history of malignant neoplasm of breast: Secondary | ICD-10-CM

## 2022-08-08 DIAGNOSIS — N644 Mastodynia: Secondary | ICD-10-CM

## 2022-08-08 NOTE — Telephone Encounter (Signed)
Diagnostic office called and states pt needs new orders for her mammogram, the order should be for both Bilateral Diagnostic and Double Ultrasound because pt is high risk for breast cancer and she is having pain in both breasts. Please advise.

## 2022-08-09 ENCOUNTER — Inpatient Hospital Stay: Admission: RE | Admit: 2022-08-09 | Payer: Medicare Other | Source: Ambulatory Visit

## 2022-08-10 NOTE — Telephone Encounter (Signed)
Noted  

## 2022-08-11 ENCOUNTER — Ambulatory Visit: Payer: Medicare Other

## 2022-08-23 ENCOUNTER — Ambulatory Visit: Payer: Medicare Other

## 2022-09-01 ENCOUNTER — Other Ambulatory Visit: Payer: Medicare Other

## 2022-09-05 ENCOUNTER — Ambulatory Visit: Payer: Medicare Other

## 2022-09-09 ENCOUNTER — Ambulatory Visit: Payer: Medicare Other

## 2022-09-20 DIAGNOSIS — H5203 Hypermetropia, bilateral: Secondary | ICD-10-CM | POA: Diagnosis not present

## 2022-09-20 DIAGNOSIS — H353111 Nonexudative age-related macular degeneration, right eye, early dry stage: Secondary | ICD-10-CM | POA: Diagnosis not present

## 2022-10-09 ENCOUNTER — Other Ambulatory Visit: Payer: Self-pay | Admitting: Family Medicine

## 2022-10-09 NOTE — Telephone Encounter (Signed)
Needs appt

## 2022-10-10 ENCOUNTER — Telehealth: Payer: Self-pay | Admitting: Family Medicine

## 2022-10-10 NOTE — Telephone Encounter (Signed)
Last OV: 03/04/22 Next OV: 10/28/22 Medication: Plaquenil 200 mg  Pharmacy: [facility] at [address]   Patient states she was pushing OV back due to meeting with an oncologist at [facility] on 4/10 and wanting to be able to discuss this with PCP. She plans on coming to 4/29 CPE but would like this refilled due to being out soon.

## 2022-10-10 NOTE — Telephone Encounter (Signed)
Patient stated that Dr. Ruthine Dose has refilled before.

## 2022-10-11 NOTE — Telephone Encounter (Signed)
Patient returned call, patient informed of message below.

## 2022-10-11 NOTE — Telephone Encounter (Signed)
Left detailed message informing patient of message below.

## 2022-10-12 ENCOUNTER — Other Ambulatory Visit: Payer: Self-pay | Admitting: Family Medicine

## 2022-10-12 ENCOUNTER — Encounter: Payer: Self-pay | Admitting: *Deleted

## 2022-10-12 DIAGNOSIS — D373 Neoplasm of uncertain behavior of appendix: Secondary | ICD-10-CM | POA: Diagnosis not present

## 2022-10-12 MED ORDER — HYDROXYCHLOROQUINE SULFATE 200 MG PO TABS
200.0000 mg | ORAL_TABLET | Freq: Every morning | ORAL | 0 refills | Status: DC
Start: 2022-10-12 — End: 2022-11-22

## 2022-10-12 NOTE — Telephone Encounter (Signed)
Patient notified of messages below.

## 2022-10-12 NOTE — Telephone Encounter (Signed)
Patient has requested that pcp call her personally.

## 2022-10-19 ENCOUNTER — Ambulatory Visit: Admission: RE | Admit: 2022-10-19 | Payer: Medicare Other | Source: Ambulatory Visit

## 2022-10-19 ENCOUNTER — Ambulatory Visit
Admission: RE | Admit: 2022-10-19 | Discharge: 2022-10-19 | Disposition: A | Discharge status: Home / Self Care | Payer: Medicare Other | Source: Ambulatory Visit | Attending: Family Medicine | Admitting: Family Medicine

## 2022-10-19 ENCOUNTER — Ambulatory Visit: Payer: Medicare Other

## 2022-10-19 DIAGNOSIS — N644 Mastodynia: Secondary | ICD-10-CM | POA: Diagnosis not present

## 2022-10-19 DIAGNOSIS — Z803 Family history of malignant neoplasm of breast: Secondary | ICD-10-CM

## 2022-10-28 ENCOUNTER — Encounter: Payer: Medicare Other | Admitting: Family Medicine

## 2022-11-08 ENCOUNTER — Encounter: Payer: Medicare Other | Admitting: Family Medicine

## 2022-11-22 ENCOUNTER — Ambulatory Visit (INDEPENDENT_AMBULATORY_CARE_PROVIDER_SITE_OTHER): Payer: Medicare Other | Admitting: Family Medicine

## 2022-11-22 ENCOUNTER — Encounter: Payer: Self-pay | Admitting: Family Medicine

## 2022-11-22 VITALS — BP 122/78 | HR 72 | Temp 98.5°F | Resp 16 | Ht 65.0 in | Wt 137.0 lb

## 2022-11-22 DIAGNOSIS — Z13220 Encounter for screening for lipoid disorders: Secondary | ICD-10-CM | POA: Diagnosis not present

## 2022-11-22 DIAGNOSIS — F4310 Post-traumatic stress disorder, unspecified: Secondary | ICD-10-CM

## 2022-11-22 DIAGNOSIS — F418 Other specified anxiety disorders: Secondary | ICD-10-CM

## 2022-11-22 DIAGNOSIS — Z803 Family history of malignant neoplasm of breast: Secondary | ICD-10-CM

## 2022-11-22 DIAGNOSIS — Z0001 Encounter for general adult medical examination with abnormal findings: Secondary | ICD-10-CM

## 2022-11-22 DIAGNOSIS — M329 Systemic lupus erythematosus, unspecified: Secondary | ICD-10-CM

## 2022-11-22 DIAGNOSIS — Z136 Encounter for screening for cardiovascular disorders: Secondary | ICD-10-CM

## 2022-11-22 DIAGNOSIS — D373 Neoplasm of uncertain behavior of appendix: Secondary | ICD-10-CM

## 2022-11-22 DIAGNOSIS — E538 Deficiency of other specified B group vitamins: Secondary | ICD-10-CM

## 2022-11-22 DIAGNOSIS — Z79899 Other long term (current) drug therapy: Secondary | ICD-10-CM | POA: Diagnosis not present

## 2022-11-22 DIAGNOSIS — E038 Other specified hypothyroidism: Secondary | ICD-10-CM | POA: Diagnosis not present

## 2022-11-22 DIAGNOSIS — R9230 Dense breasts, unspecified: Secondary | ICD-10-CM | POA: Diagnosis not present

## 2022-11-22 DIAGNOSIS — Z0000 Encounter for general adult medical examination without abnormal findings: Secondary | ICD-10-CM

## 2022-11-22 MED ORDER — TRAZODONE HCL 50 MG PO TABS: ORAL_TABLET | ORAL | 0 refills | Status: DC

## 2022-11-22 MED ORDER — HYDROXYCHLOROQUINE SULFATE 200 MG PO TABS: 200.0000 mg | ORAL_TABLET | Freq: Every morning | ORAL | 2 refills | Status: DC

## 2022-11-22 MED ORDER — CYANOCOBALAMIN 1000 MCG/ML IJ SOLN
1000.0000 ug | Freq: Once | INTRAMUSCULAR | Status: AC
Start: 2022-11-22 — End: 2022-11-22
  Administered 2022-11-22: 1000 ug via INTRAMUSCULAR

## 2022-11-22 NOTE — Progress Notes (Unsigned)
Phone [phone]   Subjective:   Patient is a 62 y.o. female presenting for annual physical.    Chief Complaint  Patient presents with   Annual Exam    CPE Not fasting   Annual- has lived in several states.   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  Dr. Lorelee Cover to be done.  Complex PTSD-stopped ETOH.  Doing ok on Lexapro 20 mg.  No SI.  Wellbutrin-more anxious.  Thyroid-doing well Lupus-[place] rheum-Dr. Erven Colla till August.  Patient was off plaquinel when all the health stuff started, but needs to get back on. Saw eye doc few weeks ago.  Some mac degeneration.   See problem oriented charting- ROS- ROS: Gen: no fever, chills  Skin: no rash, itching ENT: no ear pain, ear drainage, nasal congestion, rhinorrhea, sinus pressure, sore throat Eyes: no blurry vision, double vision Resp: no wheeze,SHORTNESS OF BREATH.  Chronic, dry cough intermitt for several months.  Worse past few weeks.   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   Hyperlipidemia    Hypothyroidism 08/17/2021   Lupus (HCC)    Pneumonia    Last 15 years ago   PTSD (post-traumatic stress disorder) 04/24/2020   Systemic lupus erythematosus (HCC) 08/17/2021   Patient Active Problem List   Diagnosis Date Noted   Vitamin D deficiency 02/07/2022   Chronic appendicitis s/p robotic LOA & appendectomy 01/05/2022 01/05/2022   B12 deficiency 09/02/2021   Family history of breast cancer 09/02/2021   Pelvic abscess in female 08/24/2021   Chronic peritonitis  (HCC) 08/24/2021   Electrolyte imbalance 08/24/2021   Hypothyroidism 08/17/2021   Hyponatremia 08/17/2021   Hypokalemia 08/17/2021   Normocytic anemia 08/17/2021   Depression with anxiety 08/17/2021   Claustrophobia 08/17/2021   Dyslipidemia 08/17/2021   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   Systemic lupus erythematosus (HCC) 08/17/2021   Hypophosphatemia 08/17/2021   Acute appendicitis with perforation, generalized peritonitis, and abscess 08/17/2021   Enteritis 08/16/2021   PTSD (post-traumatic stress disorder) 04/24/2020   Past Surgical History:  Procedure Laterality Date   TONSILLECTOMY     WISDOM TOOTH EXTRACTION      Family History  Problem Relation Age of Onset   Depression Mother    Cancer Mother    Arthritis Mother    Breast cancer Mother    COPD Mother    Heart disease Father    Hyperlipidemia Father    Alcohol abuse Father    Depression Father    Coronary artery disease Father    Arrhythmia Father    Heart attack Father    Breast cancer Sister    Drug abuse Sister    Colon cancer Neg Hx    Esophageal cancer Neg Hx    Stomach cancer Neg Hx    Pancreatic cancer Neg Hx     Medications- reviewed and updated Current Outpatient Medications  Medication Sig Dispense Refill   acetaminophen (TYLENOL) 325 MG tablet Take by mouth.     BIOTIN PO Take 1 tablet by mouth every morning.     Calcium Carbonate-Vitamin D (CALCIUM-D PO) Take 2  tablets by mouth every morning.     escitalopram (LEXAPRO) 20 MG tablet Take 1 tablet (20 mg total) by mouth daily. 90 tablet 3   hydroxychloroquine (PLAQUENIL) 200 MG tablet Take 1 tablet (200 mg total) by mouth every morning. 15 tablet 0   ibuprofen (ADVIL) 200 MG tablet Take 400-800 mg by mouth every 6 (six) hours as needed (pain).     levothyroxine (SYNTHROID) 50 MCG tablet Take 50 mcg by mouth daily before breakfast.     Multiple Vitamin (MULTIVITAMIN WITH MINERALS) TABS tablet Take 2 tablets by mouth every morning. Plant based multi  vitamin     Propylene Glycol, PF, (SYSTANE COMPLETE PF) 0.6 % SOLN Place 1 drop into both eyes 5 (five) times daily as needed (dry eyes).     traZODone (DESYREL) 50 MG tablet TAKE 1 TABLET(50 MG) BY MOUTH AT BEDTIME AS NEEDED FOR SLEEP 30 tablet 0   Saccharomyces boulardii (FLORASTOR PO) Take 2 capsules by mouth every morning. (Patient not taking: Reported on 11/22/2022)     traMADol (ULTRAM) 50 MG tablet Take by mouth. (Patient not taking: Reported on 11/22/2022)     No current facility-administered medications for this visit.    Allergies-reviewed and updated Allergies  Allergen Reactions   Amoxicillin-Pot Clavulanate     Other Reaction(s): Diarrhea, GI Intolerance   Amoxicillin Nausea And Vomiting    Caused nausea, vomiting and diarrhea   Oxycodone Rash   Penicillins Nausea And Vomiting    Other Reaction(s): GI Intolerance  Caused nausea, vomiting and diarrhea    Social History   Social History Narrative   Not on file   Objective  Objective:  BP 122/78   Pulse 72   Temp 98.5 F (36.9 C) (Temporal)   Resp 16   Ht 5\' 5"  (1.651 m)   Wt 137 lb (62.1 kg)   SpO2 99%   BMI 22.80 kg/m  Physical Exam  Gen: WDWN NAD HEENT: NCAT, conjunctiva not injected, sclera nonicteric TM WNL B, OP moist, no exudates  NECK:  supple, no thyromegaly, no nodes, no carotid bruits CARDIAC: RRR, S1S2+, no murmur. DP 2+B LUNGS: CTAB. No wheezes ABDOMEN:  BS+, soft, NTND, No HSM, no masses EXT:  no edema MSK: no gross abnormalities. MS 5/5 all 4 NEURO: A&O x3.  CN II-XII intact.  PSYCH: normal mood. Good eye contact     Assessment and Plan   Health Maintenance counseling: 1. Anticipatory guidance: Patient counseled regarding regular dental exams q6 months, eye exams,  avoiding smoking and second hand smoke, limiting alcohol to 1 beverage per day, no illicit drugs.   2. Risk factor reduction:  Advised patient of need for regular exercise and diet rich and fruits and vegetables to reduce  risk of heart attack and stroke. Exercise- encourage.  Wt Readings from Last 3 Encounters:  11/22/22 137 lb (62.1 kg)  03/02/22 122 lb (55.3 kg)  01/05/22 116 lb 13.5 oz (53 kg)   3. Immunizations/screenings/ancillary studies Immunization History  Administered Date(s) Administered   Influenza-Unspecified 06/10/2021   Moderna Covid-19 Vaccine Bivalent Booster 29yrs & up 07/05/2021   Moderna Sars-Covid-2 Vaccination 09/02/2019, 10/01/2019, 08/08/2020   Tdap 02/15/2021   Health Maintenance Due  Topic Date Due   Hepatitis C Screening  Never done   Medicare Annual Wellness (AWV)  11/17/2022    4. Cervical cancer screening- *** 5. Breast cancer screening-  mammogram *** 6. Colon cancer screening - *** 7. Skin cancer screening- advised regular sunscreen use. Denies worrisome, changing,  or new skin lesions.  8. Birth control/STD check- *** 9. Osteoporosis screening- *** 10. Smoking associated screening - *** smoker  Wellness examination  B12 deficiency -     Cyanocobalamin    Recommended follow up: No follow-ups on file.  Lab/Order associations:*** fasting  [name], MD

## 2022-11-22 NOTE — Patient Instructions (Signed)
It was very nice to see you today!  Call [facility] ordered Call oncologist for CT chest?   PLEASE NOTE:  If you had any lab tests please let us know if you have not heard back within a few days. You may see your results on MyChart before we have a chance to review them but we will give you a call once they are reviewed by [place]. If we ordered any referrals today, please let us know if you have not heard from their office within the next week.   Please try these tips to maintain a healthy lifestyle:  Eat most of your calories during the day when you are active. Eliminate processed foods including packaged sweets (pies, cakes, cookies), reduce intake of potatoes, white bread, white pasta, and white rice. Look for whole grain options, oat flour or almond flour.  Each meal should contain half fruits/vegetables, one quarter protein, and one quarter carbs (no bigger than a computer mouse).  Cut down on sweet beverages. This includes juice, soda, and sweet tea. Also watch fruit intake, though this is a healthier sweet option, it still contains natural sugar! Limit to 3 servings daily.  Drink at least 1 glass of water with each meal and aim for at least 8 glasses per day  Exercise at least 150 minutes every week.

## 2022-11-23 ENCOUNTER — Encounter: Payer: Self-pay | Admitting: Family Medicine

## 2022-11-23 DIAGNOSIS — R9230 Dense breasts, unspecified: Secondary | ICD-10-CM | POA: Insufficient documentation

## 2022-11-23 LAB — COMPREHENSIVE METABOLIC PANEL
ALT: 18 U/L (ref 0–35)
AST: 30 U/L (ref 0–37)
Albumin: 4.5 g/dL (ref 3.5–5.2)
Alkaline Phosphatase: 58 U/L (ref 39–117)
BUN: 14 mg/dL (ref 6–23)
CO2: 27 mEq/L (ref 19–32)
Calcium: 10.4 mg/dL (ref 8.4–10.5)
Chloride: 97 mEq/L (ref 96–112)
Creatinine, Ser: 0.6 mg/dL (ref 0.40–1.20)
GFR: 97.81 mL/min (ref >60.00)
Glucose, Bld: 89 mg/dL (ref 70–99)
Potassium: 4 mEq/L (ref 3.5–5.1)
Sodium: 135 mEq/L (ref 135–145)
Total Bilirubin: 0.6 mg/dL (ref 0.2–1.2)
Total Protein: 7.1 g/dL (ref 6.0–8.3)

## 2022-11-23 LAB — CBC WITH DIFFERENTIAL/PLATELET
Basophils Absolute: 0.1 10*3/uL (ref 0.0–0.1)
Basophils Relative: 0.7 % (ref 0.0–3.0)
Eosinophils Absolute: 0.1 10*3/uL (ref 0.0–0.7)
Eosinophils Relative: 1.2 % (ref 0.0–5.0)
HCT: 37.4 % (ref 36.0–46.0)
Hemoglobin: 12.6 g/dL (ref 12.0–15.0)
Lymphocytes Relative: 24.5 % (ref 12.0–46.0)
Lymphs Abs: 1.9 10*3/uL (ref 0.7–4.0)
MCHC: 33.7 g/dL (ref 30.0–36.0)
MCV: 102.8 fl — ABNORMAL HIGH (ref 78.0–100.0)
Monocytes Absolute: 0.9 10*3/uL (ref 0.1–1.0)
Monocytes Relative: 11.5 % (ref 3.0–12.0)
Neutro Abs: 4.7 10*3/uL (ref 1.4–7.7)
Neutrophils Relative %: 62.1 % (ref 43.0–77.0)
Platelets: 242 10*3/uL (ref 150.0–400.0)
RBC: 3.64 Mil/uL — ABNORMAL LOW (ref 3.87–5.11)
RDW: 12.4 % (ref 11.5–15.5)
WBC: 7.6 10*3/uL (ref 4.0–10.5)

## 2022-11-23 LAB — LIPID PANEL
Cholesterol: 213 mg/dL — ABNORMAL HIGH (ref 0–200)
HDL: 84.8 mg/dL (ref >39.00)
LDL Cholesterol: 115 mg/dL — ABNORMAL HIGH (ref 0–99)
NonHDL: 128.46
Total CHOL/HDL Ratio: 3
Triglycerides: 68 mg/dL (ref 0.0–149.0)
VLDL: 13.6 mg/dL (ref 0.0–40.0)

## 2022-11-23 LAB — HEMOGLOBIN A1C: Hgb A1c MFr Bld: 5.3 % (ref 4.6–6.5)

## 2022-11-23 LAB — TSH: TSH: 1.82 u[IU]/mL (ref 0.35–5.50)

## 2022-11-24 ENCOUNTER — Telehealth: Payer: Self-pay | Admitting: Family Medicine

## 2022-11-24 NOTE — Telephone Encounter (Signed)
Patient returned call. Requests to be called. 

## 2022-11-25 NOTE — Telephone Encounter (Signed)
Left message to return call to office.

## 2022-11-30 DIAGNOSIS — J9811 Atelectasis: Secondary | ICD-10-CM | POA: Diagnosis not present

## 2022-11-30 DIAGNOSIS — Z9049 Acquired absence of other specified parts of digestive tract: Secondary | ICD-10-CM | POA: Diagnosis not present

## 2022-11-30 DIAGNOSIS — Z09 Encounter for follow-up examination after completed treatment for conditions other than malignant neoplasm: Secondary | ICD-10-CM | POA: Diagnosis not present

## 2022-11-30 DIAGNOSIS — Z803 Family history of malignant neoplasm of breast: Secondary | ICD-10-CM | POA: Diagnosis not present

## 2022-11-30 DIAGNOSIS — D121 Benign neoplasm of appendix: Secondary | ICD-10-CM | POA: Diagnosis not present

## 2022-11-30 DIAGNOSIS — Z86018 Personal history of other benign neoplasm: Secondary | ICD-10-CM | POA: Diagnosis not present

## 2022-12-08 ENCOUNTER — Ambulatory Visit (INDEPENDENT_AMBULATORY_CARE_PROVIDER_SITE_OTHER): Payer: Medicare Other

## 2022-12-08 VITALS — Wt 137.0 lb

## 2022-12-08 DIAGNOSIS — Z0000 Encounter for general adult medical examination without abnormal findings: Secondary | ICD-10-CM | POA: Diagnosis not present

## 2022-12-08 NOTE — Patient Instructions (Signed)
Deanna Fischer , Thank you for taking time to come for your Medicare Wellness Visit. I appreciate your ongoing commitment to your health goals. Please review the following plan we discussed and let me know if I can assist you in the future.   These are the goals we discussed:  Goals      Patient Stated     Start walking 5 days a week after surgery         This is a list of the screening recommended for you and due dates:  Health Maintenance  Topic Date Due   Hepatitis C Screening  Never done   COVID-19 Vaccine (5 - 2023-24 season) 02/13/2023*   Zoster (Shingles) Vaccine (1 of 2) 02/22/2023*   Flu Shot  02/02/2023   Medicare Annual Wellness Visit  12/08/2023   Pap Smear  08/11/2024   Mammogram  10/18/2024   DTaP/Tdap/Td vaccine (2 - Td or Tdap) 02/16/2031   Colon Cancer Screening  10/19/2031   HIV Screening  Completed   HPV Vaccine  Aged Out  *Topic was postponed. The date shown is not the original due date.    Advanced directives: Advance directive discussed with you today. Even though you declined this today please call our office should you change your mind and we can give you the proper paperwork for you to fill out.  Conditions/risks identified: to quit smoking and exercise  Next appointment: Follow up in one year for your annual wellness visit.   Preventive Care 40-64 Years, Female Preventive care refers to lifestyle choices and visits with your health care provider that can promote health and wellness. What does preventive care include? A yearly physical exam. This is also called an annual well check. Dental exams once or twice a year. Routine eye exams. Ask your health care provider how often you should have your eyes checked. Personal lifestyle choices, including: Daily care of your teeth and gums. Regular physical activity. Eating a healthy diet. Avoiding tobacco and drug use. Limiting alcohol use. Practicing safe sex. Taking low-dose aspirin daily starting at  age 53. Taking vitamin and mineral supplements as recommended by your health care provider. What happens during an annual well check? The services and screenings done by your health care provider during your annual well check will depend on your age, overall health, lifestyle risk factors, and family history of disease. Counseling  Your health care provider may ask you questions about your: Alcohol use. Tobacco use. Drug use. Emotional well-being. Home and relationship well-being. Sexual activity. Eating habits. Work and work Astronomer. Method of birth control. Menstrual cycle. Pregnancy history. Screening  You may have the following tests or measurements: Height, weight, and BMI. Blood pressure. Lipid and cholesterol levels. These may be checked every 5 years, or more frequently if you are over 103 years old. Skin check. Lung cancer screening. You may have this screening every year starting at age 57 if you have a 30-pack-year history of smoking and currently smoke or have quit within the past 15 years. Fecal occult blood test (FOBT) of the stool. You may have this test every year starting at age 18. Flexible sigmoidoscopy or colonoscopy. You may have a sigmoidoscopy every 5 years or a colonoscopy every 10 years starting at age 71. Hepatitis C blood test. Hepatitis B blood test. Sexually transmitted disease (STD) testing. Diabetes screening. This is done by checking your blood sugar (glucose) after you have not eaten for a while (fasting). You may have this done every 1-3  years. Mammogram. This may be done every 1-2 years. Talk to your health care provider about when you should start having regular mammograms. This may depend on whether you have a family history of breast cancer. BRCA-related cancer screening. This may be done if you have a family history of breast, ovarian, tubal, or peritoneal cancers. Pelvic exam and Pap test. This may be done every 3 years starting at age 8.  Starting at age 80, this may be done every 5 years if you have a Pap test in combination with an HPV test. Bone density scan. This is done to screen for osteoporosis. You may have this scan if you are at high risk for osteoporosis. Discuss your test results, treatment options, and if necessary, the need for more tests with your health care provider. Vaccines  Your health care provider may recommend certain vaccines, such as: Influenza vaccine. This is recommended every year. Tetanus, diphtheria, and acellular pertussis (Tdap, Td) vaccine. You may need a Td booster every 10 years. Zoster vaccine. You may need this after age 52. Pneumococcal 13-valent conjugate (PCV13) vaccine. You may need this if you have certain conditions and were not previously vaccinated. Pneumococcal polysaccharide (PPSV23) vaccine. You may need one or two doses if you smoke cigarettes or if you have certain conditions. Talk to your health care provider about which screenings and vaccines you need and how often you need them. This information is not intended to replace advice given to you by your health care provider. Make sure you discuss any questions you have with your health care provider. Document Released: 07/17/2015 Document Revised: 03/09/2016 Document Reviewed: 04/21/2015 Elsevier Interactive Patient Education  2017 [employer].    Fall Prevention in the Home Falls can cause injuries. They can happen to people of all ages. There are many things you can do to make your home safe and to help prevent falls. What can I do on the outside of my home? Regularly fix the edges of walkways and driveways and fix any cracks. Remove anything that might make you trip as you walk through a door, such as a raised step or threshold. Trim any bushes or trees on the path to your home. Use bright outdoor lighting. Clear any walking paths of anything that might make someone trip, such as rocks or tools. Regularly check to see if  handrails are loose or broken. Make sure that both sides of any steps have handrails. Any raised decks and porches should have guardrails on the edges. Have any leaves, snow, or ice cleared regularly. Use sand or salt on walking paths during winter. Clean up any spills in your garage right away. This includes oil or grease spills. What can I do in the bathroom? Use night lights. Install grab bars by the toilet and in the tub and shower. Do not use towel bars as grab bars. Use non-skid mats or decals in the tub or shower. If you need to sit down in the shower, use a plastic, non-slip stool. Keep the floor dry. Clean up any water that spills on the floor as soon as it happens. Remove soap buildup in the tub or shower regularly. Attach bath mats securely with double-sided non-slip rug tape. Do not have throw rugs and other things on the floor that can make you trip. What can I do in the bedroom? Use night lights. Make sure that you have a light by your bed that is easy to reach. Do not use any sheets or blankets  that are too big for your bed. They should not hang down onto the floor. Have a firm chair that has side arms. You can use this for support while you get dressed. Do not have throw rugs and other things on the floor that can make you trip. What can I do in the kitchen? Clean up any spills right away. Avoid walking on wet floors. Keep items that you use a lot in easy-to-reach places. If you need to reach something above you, use a strong step stool that has a grab bar. Keep electrical cords out of the way. Do not use floor polish or wax that makes floors slippery. If you must use wax, use non-skid floor wax. Do not have throw rugs and other things on the floor that can make you trip. What can I do with my stairs? Do not leave any items on the stairs. Make sure that there are handrails on both sides of the stairs and use them. Fix handrails that are broken or loose. Make sure that  handrails are as long as the stairways. Check any carpeting to make sure that it is firmly attached to the stairs. Fix any carpet that is loose or worn. Avoid having throw rugs at the top or bottom of the stairs. If you do have throw rugs, attach them to the floor with carpet tape. Make sure that you have a light switch at the top of the stairs and the bottom of the stairs. If you do not have them, ask someone to add them for you. What else can I do to help prevent falls? Wear shoes that: Do not have high heels. Have rubber bottoms. Are comfortable and fit you well. Are closed at the toe. Do not wear sandals. If you use a stepladder: Make sure that it is fully opened. Do not climb a closed stepladder. Make sure that both sides of the stepladder are locked into place. Ask someone to hold it for you, if possible. Clearly mark and make sure that you can see: Any grab bars or handrails. First and last steps. Where the edge of each step is. Use tools that help you move around (mobility aids) if they are needed. These include: Canes. Walkers. Scooters. Crutches. Turn on the lights when you go into a dark area. Replace any light bulbs as soon as they burn out. Set up your furniture so you have a clear path. Avoid moving your furniture around. If any of your floors are uneven, fix them. If there are any pets around you, be aware of where they are. Review your medicines with your doctor. Some medicines can make you feel dizzy. This can increase your chance of falling. Ask your doctor what other things that you can do to help prevent falls. This information is not intended to replace advice given to you by your health care provider. Make sure you discuss any questions you have with your health care provider. Document Released: 04/16/2009 Document Revised: 11/26/2015 Document Reviewed: 07/25/2014 Elsevier Interactive Patient Education  2017 [employer].

## 2022-12-08 NOTE — Progress Notes (Signed)
I connected with  Deanna Fischer on 12/08/22 by a audio enabled telemedicine application and verified that I am speaking with the correct person using two identifiers.  Patient Location: Home  Provider Location: Office/Clinic  I discussed the limitations of evaluation and management by telemedicine. The patient expressed understanding and agreed to proceed.   Subjective:   Deanna Fischer is a 62 y.o. female who presents for Medicare Annual (Subsequent) preventive examination.  Review of Systems     Cardiac Risk Factors include: dyslipidemia     Objective:    Today's Vitals   12/08/22 1045  Weight: 137 lb (62.1 kg)   Body mass index is 22.8 kg/m.     12/08/2022   10:55 AM 01/05/2022   10:44 AM 11/16/2021   10:41 AM 08/17/2021   12:00 PM 08/16/2021    3:41 PM 02/15/2021    9:12 PM  Advanced Directives  Does Patient Have a Medical Advance Directive? No No No No No No  Would patient like information on creating a medical advance directive? No - Patient declined Yes (Inpatient - patient defers creating a medical advance directive at this time - Information given) Yes (MAU/Ambulatory/Procedural Areas - Information given) No - Patient declined No - Patient declined     Current Medications (verified) Outpatient Encounter Medications as of 12/08/2022  Medication Sig   acetaminophen (TYLENOL) 325 MG tablet Take by mouth.   BIOTIN PO Take 1 tablet by mouth every morning.   Calcium Carbonate-Vitamin D (CALCIUM-D PO) Take 2 tablets by mouth every morning.   escitalopram (LEXAPRO) 20 MG tablet Take 1 tablet (20 mg total) by mouth daily.   hydroxychloroquine (PLAQUENIL) 200 MG tablet Take 1 tablet (200 mg total) by mouth every morning.   ibuprofen (ADVIL) 200 MG tablet Take 400-800 mg by mouth every 6 (six) hours as needed (pain).   levothyroxine (SYNTHROID) 50 MCG tablet Take 50 mcg by mouth daily before breakfast.   Multiple Vitamin (MULTIVITAMIN WITH MINERALS) TABS tablet Take 2 tablets by  mouth every morning. Plant based multi vitamin   Propylene Glycol, PF, (SYSTANE COMPLETE PF) 0.6 % SOLN Place 1 drop into both eyes 5 (five) times daily as needed (dry eyes).   traZODone (DESYREL) 50 MG tablet TAKE 1 TABLET(50 MG) BY MOUTH AT BEDTIME AS NEEDED FOR SLEEP   No facility-administered encounter medications on file as of 12/08/2022.    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   Hyperlipidemia    Hypothyroidism 08/17/2021   Low grade mucinous neoplasm of appendix 2023   Lupus (HCC)    Pneumonia    Last 15 years ago   PTSD (post-traumatic stress disorder) 04/24/2020   Systemic lupus erythematosus (HCC) 08/17/2021   Past Surgical History:  Procedure Laterality Date   APPENDECTOMY  01/05/2022   TONSILLECTOMY     WISDOM TOOTH EXTRACTION     Family History  Problem Relation Age of Onset   Depression Mother    Cancer Mother    Arthritis Mother    Breast cancer Mother    COPD Mother    Heart disease Father    Hyperlipidemia Father    Alcohol abuse Father    Depression Father    Coronary artery disease Father    Arrhythmia Father    Heart attack Father  Breast cancer Sister    Drug abuse Sister    Colon cancer Neg Hx    Esophageal cancer Neg Hx    Stomach cancer Neg Hx    Pancreatic cancer Neg Hx    Social History   Socioeconomic History   Marital status: Single    Spouse name: Not on file   Number of children: 0   Years of education: Not on file   Highest education level: Not on file  Occupational History   Not on file  Tobacco Use   Smoking status: Former    Packs/day: 0.25    Years: 25.00    Additional pack years: 0.00    Total pack years: 6.25    Types: Cigarettes    Quit date: 08/04/2022    Years since  quitting: 0.3   Smokeless tobacco: Never  Vaping Use   Vaping Use: Never used  Substance and Sexual Activity   Alcohol use: Not Currently    Alcohol/week: 9.0 standard drinks of alcohol    Types: 9 Glasses of wine per week   Drug use: Never   Sexual activity: Not Currently    Birth control/protection: Post-menopausal  Other Topics Concern   Not on file  Social History Narrative   2 pregnancy losses 5th month-lupus anticoagulation.   Social Determinants of Health   Financial Resource Strain: Low Risk  (12/08/2022)   Overall Financial Resource Strain (CARDIA)    Difficulty of Paying Living Expenses: Not hard at all  Food Insecurity: No Food Insecurity (12/08/2022)   Hunger Vital Sign    Worried About Running Out of Food in the Last Year: Never true    Ran Out of Food in the Last Year: Never true  Transportation Needs: No Transportation Needs (12/08/2022)   PRAPARE - Administrator, Civil Service (Medical): No    Lack of Transportation (Non-Medical): No  Physical Activity: Inactive (12/08/2022)   Exercise Vital Sign    Days of Exercise per Week: 0 days    Minutes of Exercise per Session: 0 min  Stress: Stress Concern Present (12/08/2022)   [employer] of Occupational Health - Occupational Stress Questionnaire    Feeling of Stress : To some extent  Social Connections: Moderately Integrated (12/08/2022)   Social Connection and Isolation Panel [NHANES]    Frequency of Communication with Friends and Family: Three times a week    Frequency of Social Gatherings with Friends and Family: Once a week    Attends Religious Services: More than 4 times per year    Active Member of [employer] or Organizations: Yes    Attends Banker Meetings: 1 to 4 times per year    Marital Status: Never married    Tobacco Counseling Counseling given: Not Answered   Clinical Intake:  Pre-visit preparation completed: Yes  Pain : No/denies pain     BMI - recorded:  22.8 Nutritional Status: BMI of 19-24  Normal Nutritional Risks: None Diabetes: No  How often do you need to have someone help you when you read instructions, pamphlets, or other written materials from your doctor or pharmacy?: 1 - Never  Diabetic?no  Interpreter Needed?: No      Activities of Daily Living    12/08/2022   10:57 AM 12/23/2021   12:11 PM  In your present state of health, do you have any difficulty performing the following activities:  Hearing? 0   Vision? 1   Comment start of macular degeneration   Difficulty concentrating  or making decisions? 0   Walking or climbing stairs? 0   Dressing or bathing? 1   Comment more difficult   Doing errands, shopping? 0 0  Preparing Food and eating ? N   Using the Toilet? N   In the past six months, have you accidently leaked urine? N   Do you have problems with loss of bowel control? N   Managing your Medications? N   Managing your Finances? N   Housekeeping or managing your Housekeeping? N     Patient Care Team: Jeani Sow, MD as PCP - General (Family Medicine) Karie Soda, MD as Consulting Physician (General Surgery) Jenel Lucks, MD as Consulting Physician (Gastroenterology)  Indicate any recent Medical Services you may have received from other than [facility] providers in the past year (date may be approximate).     Assessment:   This is a routine wellness examination for Afia.  Hearing/Vision screen Hearing Screening - Comments:: Pt denies any hearing issues  Vision Screening - Comments:: Pt follows up with Dr Emily Filbert for annual eye exams   Dietary issues and exercise activities discussed: Current Exercise Habits: The patient does not participate in regular exercise at present   Goals Addressed             This Visit's Progress    Patient Stated       To quit smoking and start exercise        Depression Screen    12/08/2022   10:52 AM 11/22/2022    2:06 PM 11/16/2021   10:37 AM 09/22/2021     1:23 PM 09/02/2021   10:33 AM 09/18/2018   10:52 AM 08/31/2018    2:13 PM  PHQ 2/9 Scores  PHQ - 2 Score 0 1 4      PHQ- 9 Score 2 3 4       Exception Documentation    Patient refusal Patient refusal       Information is confidential and restricted. Go to Review Flowsheets to unlock data.    Fall Risk    12/08/2022   10:56 AM 11/22/2022    2:05 PM 03/04/2022   10:42 AM 11/16/2021   10:42 AM 09/22/2021    1:22 PM  Fall Risk   Falls in the past year? 1 1 0 1 1  Number falls in past yr: 1 0 0 1 1  Injury with Fall? 1 1 0 1 1  Comment walking dog fell   fell over dog   Risk for fall due to : Impaired balance/gait;Impaired vision Impaired balance/gait No Fall Risks Impaired vision   Follow up Falls prevention discussed Education provided;Falls prevention discussed Falls evaluation completed Falls prevention discussed Falls evaluation completed;Education provided;Falls prevention discussed    FALL RISK PREVENTION PERTAINING TO THE HOME:  Any stairs in or around the home? Yes  If so, are there any without handrails? No  Home free of loose throw rugs in walkways, pet beds, electrical cords, etc? Yes  Adequate lighting in your home to reduce risk of falls? Yes   ASSISTIVE DEVICES UTILIZED TO PREVENT FALLS:  Life alert? No  Use of a cane, walker or w/c? No  Grab bars in the bathroom? No  Shower chair or bench in shower? No  Elevated toilet seat or a handicapped toilet? No   TIMED UP AND GO:  Was the test performed? No .    Cognitive Function:        12/08/2022   11:00 AM 11/16/2021  10:45 AM  6CIT Screen  What Year? 0 points 0 points  What month? 0 points 0 points  What time? 0 points 0 points  Count back from 20 0 points 0 points  Months in reverse 0 points 0 points  Repeat phrase 0 points 0 points  Total Score 0 points 0 points    Immunizations Immunization History  Administered Date(s) Administered   Influenza-Unspecified 06/10/2021   Moderna Covid-19 Vaccine  Bivalent Booster 67yrs & up 07/05/2021   Moderna Sars-Covid-2 Vaccination 09/02/2019, 10/01/2019, 08/08/2020   Tdap 02/15/2021    TDAP status: Up to date  Flu Vaccine status: Due, Education has been provided regarding the importance of this vaccine. Advised may receive this vaccine at local pharmacy or Health Dept. Aware to provide a copy of the vaccination record if obtained from local pharmacy or Health Dept. Verbalized acceptance and understanding.    Covid-19 vaccine status: Completed vaccines  Qualifies for Shingles Vaccine? Yes   Zostavax completed No   Shingrix Completed?: No.    Education has been provided regarding the importance of this vaccine. Patient has been advised to call insurance company to determine out of pocket expense if they have not yet received this vaccine. Advised may also receive vaccine at local pharmacy or Health Dept. Verbalized acceptance and understanding.  Screening Tests Health Maintenance  Topic Date Due   Hepatitis C Screening  Never done   COVID-19 Vaccine (5 - 2023-24 season) 02/13/2023 (Originally 03/04/2022)   Zoster Vaccines- Shingrix (1 of 2) 02/22/2023 (Originally [birth year]30/1981)   INFLUENZA VACCINE  02/02/2023   Medicare Annual Wellness (AWV)  12/08/2023   PAP SMEAR-Modifier  08/11/2024   MAMMOGRAM  10/18/2024   DTaP/Tdap/Td (2 - Td or Tdap) 02/16/2031   Colonoscopy  10/19/2031   HIV Screening  Completed   HPV VACCINES  Aged Out    Health Maintenance  Health Maintenance Due  Topic Date Due   Hepatitis C Screening  Never done    Colorectal cancer screening: Type of screening: Colonoscopy. Completed 10/18/21. Repeat every 10 years  Mammogram status: Completed 10/19/22. Repeat every year    Additional Screening:  Hepatitis C Screening: does qualify  Vision Screening: Recommended annual ophthalmology exams for early detection of glaucoma and other disorders of the eye. Is the patient up to date with their annual eye exam?  Yes  Who  is the provider or what is the name of the office in which the patient attends annual eye exams? Dr Elise Benne  If pt is not established with a provider, would they like to be referred to a provider to establish care? No .   Dental Screening: Recommended annual dental exams for proper oral hygiene  Community Resource Referral / Chronic Care Management: CRR required this visit?  No   CCM required this visit?  No      Plan:     I have personally reviewed and noted the following in the patient's chart:   Medical and social history Use of alcohol, tobacco or illicit drugs  Current medications and supplements including opioid prescriptions. Patient is not currently taking opioid prescriptions. Functional ability and status Nutritional status Physical activity Advanced directives List of other physicians Hospitalizations, surgeries, and ER visits in previous 12 months Vitals Screenings to include cognitive, depression, and falls Referrals and appointments  In addition, I have reviewed and discussed with patient certain preventive protocols, quality metrics, and best practice recommendations. A written personalized care plan for preventive services as well as general preventive  health recommendations were provided to patient.     Marzella Schlein, LPN   07/09/1094   Nurse Notes: none

## 2022-12-09 ENCOUNTER — Ambulatory Visit: Payer: Medicare Other | Admitting: Family Medicine

## 2022-12-10 DIAGNOSIS — S92041A Displaced other fracture of tuberosity of right calcaneus, initial encounter for closed fracture: Secondary | ICD-10-CM | POA: Diagnosis not present

## 2022-12-10 DIAGNOSIS — X58XXXA Exposure to other specified factors, initial encounter: Secondary | ICD-10-CM | POA: Diagnosis not present

## 2022-12-14 DIAGNOSIS — S92001A Unspecified fracture of right calcaneus, initial encounter for closed fracture: Secondary | ICD-10-CM | POA: Diagnosis not present

## 2022-12-15 ENCOUNTER — Other Ambulatory Visit: Payer: Self-pay | Admitting: Orthopaedic Surgery

## 2022-12-15 DIAGNOSIS — M79671 Pain in right foot: Secondary | ICD-10-CM

## 2022-12-16 ENCOUNTER — Telehealth (INDEPENDENT_AMBULATORY_CARE_PROVIDER_SITE_OTHER): Payer: Medicare Other | Admitting: Family Medicine

## 2022-12-16 ENCOUNTER — Encounter: Payer: Self-pay | Admitting: Family Medicine

## 2022-12-16 DIAGNOSIS — S92041D Displaced other fracture of tuberosity of right calcaneus, subsequent encounter for fracture with routine healing: Secondary | ICD-10-CM

## 2022-12-16 DIAGNOSIS — E538 Deficiency of other specified B group vitamins: Secondary | ICD-10-CM | POA: Diagnosis not present

## 2022-12-16 DIAGNOSIS — D373 Neoplasm of uncertain behavior of appendix: Secondary | ICD-10-CM

## 2022-12-16 DIAGNOSIS — M8000XA Age-related osteoporosis with current pathological fracture, unspecified site, initial encounter for fracture: Secondary | ICD-10-CM

## 2022-12-16 DIAGNOSIS — R42 Dizziness and giddiness: Secondary | ICD-10-CM | POA: Diagnosis not present

## 2022-12-16 DIAGNOSIS — Z8781 Personal history of (healed) traumatic fracture: Secondary | ICD-10-CM | POA: Diagnosis not present

## 2022-12-16 NOTE — Progress Notes (Unsigned)
MyChart Video Visit Virtual Visit via Video Note   This visit type was conducted w/patient consent. This format is felt to be most appropriate for this patient at this time. Physical exam was limited by quality of the video and audio technology used for the visit. CMA was able to get the patient set up on a video visit.  Patient location: Home. Patient and provider in visit Provider location: Office  I discussed the limitations of evaluation and management by telemedicine and the availability of in person appointments. The patient expressed understanding and agreed to proceed.  Visit Date: 12/16/2022  Today's healthcare provider: [name], MD     Subjective:    Patient ID: Deanna Fischer, female    DOB: Sep 19, 1960, 62 y.o.   MRN: [id number]  Chief Complaint  Patient presents with   Medical Management of Chronic Issues    Follow-up on broken heel, recent blood work, dizziness, anemia, B 12, bone density results, etc    HPI Osteoporosis-taking calcium and vitamin D.  History of steroids in past.  Closed displaced fracture of tuberosity of right calcaneus - seeing Dr. Hassan Fischer came down on right heel on 6/7 Dizziness-first am and fatigued some.  Deanna Fischer out to garden and grabbed rail and came down hard on right heel on 6/7 and fracture calcaneous Anemia-better.  ? If B12  Appendix neoplasm-had scans and all ok.    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     BIOTIN PO Take 1 tablet by mouth every morning.     Calcium Carbonate-Vitamin D (CALCIUM-D PO) Take 2 tablets by mouth every morning.     escitalopram (LEXAPRO) 20 MG tablet Take 1 tablet (20 mg total) by mouth daily. 90 tablet 3   HYDROcodone-acetaminophen (NORCO/VICODIN) 5-325 MG tablet Take by mouth.     hydroxychloroquine (PLAQUENIL) 200 MG tablet Take 1 tablet (200 mg total) by mouth every morning. 30 tablet 2   ibuprofen (ADVIL) 200 MG tablet Take 400-800 mg by mouth every 6 (six) hours as needed (pain).     levothyroxine (SYNTHROID) 50 MCG tablet Take 50 mcg by mouth daily before breakfast.     Multiple Vitamin (MULTIVITAMIN WITH MINERALS) TABS tablet Take 2 tablets by mouth every morning. Plant based multi vitamin     ondansetron (ZOFRAN) 4 MG tablet Take 8 mg by mouth 2 (two) times daily.     Propylene Glycol, PF, (SYSTANE COMPLETE PF) 0.6 % SOLN Place 1 drop into both eyes 5 (five) times daily as needed (dry eyes).     traZODone (DESYREL) 50 MG tablet TAKE 1 TABLET(50 MG) BY MOUTH AT BEDTIME AS NEEDED FOR SLEEP 30 tablet 0   No facility-administered medications prior to visit.    Allergies  Allergen Reactions   Amoxicillin-Pot Clavulanate  Other Reaction(s): Diarrhea, GI Intolerance   Amoxicillin Nausea And Vomiting    Caused nausea, vomiting and diarrhea   Penicillins Nausea And Vomiting    Other Reaction(s): GI Intolerance  Caused nausea, vomiting and diarrhea        Objective:     Physical Exam  Vitals and nursing note reviewed.  Constitutional:      General:  is not in acute distress.    Appearance: Normal appearance.  HENT:     Head: Normocephalic.  Pulmonary:     Effort: No respiratory distress.  Skin:    General: Skin is dry.     Coloration: Skin is not pale.  Neurological:     Mental Status: Pt is alert and oriented  to person, place, and time.  Psychiatric:        Mood and Affect: Mood normal.   There were no vitals taken for this visit.  Wt Readings from Last 3 Encounters:  12/08/22 137 lb (62.1 kg)  11/22/22 137 lb (62.1 kg)  03/02/22 122 lb (55.3 kg)         Assessment & Plan:   Problem List Items Addressed This Visit   None Prolia-  No orders of the defined types were placed in this encounter.   I discussed the assessment and treatment plan with the patient. The patient was provided an opportunity to ask questions and all were answered. The patient agreed with the plan and demonstrated an understanding of the instructions.   The patient was advised to call back or seek an in-person evaluation if the symptoms worsen or if the condition fails to improve as anticipated.  No follow-ups on file.  [name], MD [facility] [place] [phone] (phone) [phone] (fax)  [facility]

## 2022-12-16 NOTE — Patient Instructions (Addendum)
add electrolytes to water.  Will work on seeing if [employer] can do B12  Will work on [employer]

## 2022-12-18 DIAGNOSIS — M8580 Other specified disorders of bone density and structure, unspecified site: Secondary | ICD-10-CM | POA: Insufficient documentation

## 2022-12-18 DIAGNOSIS — M8000XA Age-related osteoporosis with current pathological fracture, unspecified site, initial encounter for fracture: Secondary | ICD-10-CM | POA: Insufficient documentation

## 2022-12-18 NOTE — Assessment & Plan Note (Signed)
Chronic. Status post surgery.  No recurrence.  Monitored by Dr. Lenis Noon

## 2022-12-18 NOTE — Assessment & Plan Note (Signed)
Newer diagnosis-patient on calcium and D.  Has had several fractures.  History of steroids in past.  Will refer for Prolia.

## 2022-12-18 NOTE — Assessment & Plan Note (Signed)
Chronic.  Had trouble getting here for injections.  Has restarted but will still have difficulty coming to this office.  Manager checking w/[employer] [place] if they can do

## 2022-12-19 ENCOUNTER — Telehealth: Payer: Self-pay | Admitting: Family Medicine

## 2022-12-19 ENCOUNTER — Other Ambulatory Visit: Payer: Medicare Other

## 2022-12-19 ENCOUNTER — Other Ambulatory Visit (HOSPITAL_COMMUNITY): Payer: Self-pay

## 2022-12-19 ENCOUNTER — Telehealth: Payer: Self-pay

## 2022-12-19 NOTE — Telephone Encounter (Signed)
Please see message below

## 2022-12-19 NOTE — Telephone Encounter (Signed)
Pharmacy Patient Advocate Encounter   Received notification from [employer] that prior authorization for Prolia is required/requested.   PA submitted to [employer] via [employer] portal Key  # [id number] Status is pending

## 2022-12-19 NOTE — Telephone Encounter (Signed)
Ok with me but needs to keep care with PCP until [facility] visit

## 2022-12-19 NOTE — Telephone Encounter (Signed)
Prolia VOB initiated via [email or web address]   Insurance verification completed.    The current 180 day co-pay is, $717.18.   The patient is insured through [place] Part D

## 2022-12-19 NOTE — Telephone Encounter (Signed)
Received a call from [facility]) about the prior authorization for Prolia.   The representative stated the patient will to have tried and failed both oral and IV Bisphosphonates before the insurance will pay for Prolia.   Please advise.

## 2022-12-19 NOTE — Telephone Encounter (Signed)
Patient is requesting to transfer care from Dr. Ruthine Dose to Dr. Okey Dupre due to logistics - the [facility] office is much closer to her residence. Please advise.

## 2022-12-20 ENCOUNTER — Other Ambulatory Visit: Payer: Self-pay | Admitting: *Deleted

## 2022-12-20 ENCOUNTER — Ambulatory Visit
Admission: RE | Admit: 2022-12-20 | Discharge: 2022-12-20 | Disposition: A | Discharge status: Home / Self Care | Payer: Medicare Other | Source: Ambulatory Visit | Attending: Orthopaedic Surgery | Admitting: Orthopaedic Surgery

## 2022-12-20 DIAGNOSIS — M79671 Pain in right foot: Secondary | ICD-10-CM

## 2022-12-20 DIAGNOSIS — S92014A Nondisplaced fracture of body of right calcaneus, initial encounter for closed fracture: Secondary | ICD-10-CM | POA: Diagnosis not present

## 2022-12-20 DIAGNOSIS — M8588 Other specified disorders of bone density and structure, other site: Secondary | ICD-10-CM | POA: Diagnosis not present

## 2022-12-20 MED ORDER — ALENDRONATE SODIUM 70 MG PO TABS: 70.0000 mg | ORAL_TABLET | ORAL | 5 refills | Status: DC

## 2022-12-20 NOTE — Telephone Encounter (Signed)
Patient notified of message below. Rx sent to the pharmacy. Patient wanted to know when she should get a bone density test done. Please advise.

## 2022-12-20 NOTE — Telephone Encounter (Signed)
Left message to return call to office.

## 2022-12-21 ENCOUNTER — Encounter: Payer: Self-pay | Admitting: *Deleted

## 2022-12-21 NOTE — Telephone Encounter (Signed)
Patient notified of message below.

## 2022-12-22 ENCOUNTER — Telehealth: Payer: Self-pay | Admitting: Pharmacy Technician

## 2022-12-22 NOTE — Telephone Encounter (Signed)
-----   Message from [name], RPH-CPP sent at 12/19/2022  8:20 AM EDT ----- Dr Ruthine Dose would like to start Prolia for this patient. Will you complete an insurance verification and sent back so we can see if this option would be affordable for Deanna Fischer.

## 2022-12-23 DIAGNOSIS — S92001A Unspecified fracture of right calcaneus, initial encounter for closed fracture: Secondary | ICD-10-CM | POA: Diagnosis not present

## 2022-12-29 ENCOUNTER — Ambulatory Visit: Payer: Medicare Other

## 2023-01-02 ENCOUNTER — Other Ambulatory Visit: Payer: Self-pay | Admitting: Family Medicine

## 2023-01-09 ENCOUNTER — Telehealth: Payer: Self-pay | Admitting: Family Medicine

## 2023-01-09 NOTE — Telephone Encounter (Signed)
Patient needs her B12 shot - she said that [facility] approved her B12 shots - please call patient - [phone]

## 2023-01-09 NOTE — Telephone Encounter (Signed)
I scheduled patient for her B12 - she does not need you to call.  Thank you

## 2023-01-11 ENCOUNTER — Ambulatory Visit: Payer: Medicare Other

## 2023-02-08 ENCOUNTER — Encounter: Payer: Self-pay | Admitting: Internal Medicine

## 2023-02-08 ENCOUNTER — Telehealth: Payer: Self-pay | Admitting: Internal Medicine

## 2023-02-08 ENCOUNTER — Ambulatory Visit: Payer: Medicare Other | Admitting: Internal Medicine

## 2023-02-08 VITALS — BP 116/84 | HR 83 | Temp 98.7°F | Ht 65.0 in | Wt 136.0 lb

## 2023-02-08 DIAGNOSIS — D373 Neoplasm of uncertain behavior of appendix: Secondary | ICD-10-CM

## 2023-02-08 DIAGNOSIS — M85859 Other specified disorders of bone density and structure, unspecified thigh: Secondary | ICD-10-CM

## 2023-02-08 DIAGNOSIS — E538 Deficiency of other specified B group vitamins: Secondary | ICD-10-CM

## 2023-02-08 DIAGNOSIS — D649 Anemia, unspecified: Secondary | ICD-10-CM | POA: Diagnosis not present

## 2023-02-08 DIAGNOSIS — M329 Systemic lupus erythematosus, unspecified: Secondary | ICD-10-CM | POA: Diagnosis not present

## 2023-02-08 DIAGNOSIS — E038 Other specified hypothyroidism: Secondary | ICD-10-CM

## 2023-02-08 DIAGNOSIS — F418 Other specified anxiety disorders: Secondary | ICD-10-CM

## 2023-02-08 DIAGNOSIS — E559 Vitamin D deficiency, unspecified: Secondary | ICD-10-CM | POA: Diagnosis not present

## 2023-02-08 DIAGNOSIS — M328 Other forms of systemic lupus erythematosus: Secondary | ICD-10-CM | POA: Diagnosis not present

## 2023-02-08 LAB — COMPREHENSIVE METABOLIC PANEL
ALT: 14 U/L (ref 0–35)
AST: 22 U/L (ref 0–37)
Albumin: 4.6 g/dL (ref 3.5–5.2)
Alkaline Phosphatase: 58 U/L (ref 39–117)
BUN: 9 mg/dL (ref 6–23)
CO2: 29 mEq/L (ref 19–32)
Calcium: 9.5 mg/dL (ref 8.4–10.5)
Chloride: 103 mEq/L (ref 96–112)
Creatinine, Ser: 0.66 mg/dL (ref 0.40–1.20)
GFR: 95.44 mL/min (ref >60.00)
Glucose, Bld: 83 mg/dL (ref 70–99)
Potassium: 4.6 mEq/L (ref 3.5–5.1)
Sodium: 141 mEq/L (ref 135–145)
Total Bilirubin: 0.4 mg/dL (ref 0.2–1.2)
Total Protein: 7.5 g/dL (ref 6.0–8.3)

## 2023-02-08 LAB — CBC WITH DIFFERENTIAL/PLATELET
Basophils Absolute: 0.1 10*3/uL (ref 0.0–0.1)
Basophils Relative: 1 % (ref 0.0–3.0)
Eosinophils Absolute: 0.1 10*3/uL (ref 0.0–0.7)
Eosinophils Relative: 2 % (ref 0.0–5.0)
HCT: 39.5 % (ref 36.0–46.0)
Hemoglobin: 13.1 g/dL (ref 12.0–15.0)
Lymphocytes Relative: 23.4 % (ref 12.0–46.0)
Lymphs Abs: 1.3 10*3/uL (ref 0.7–4.0)
MCHC: 33.2 g/dL (ref 30.0–36.0)
MCV: 100.5 fl — ABNORMAL HIGH (ref 78.0–100.0)
Monocytes Absolute: 0.7 10*3/uL (ref 0.1–1.0)
Monocytes Relative: 11.4 % (ref 3.0–12.0)
Neutro Abs: 3.6 10*3/uL (ref 1.4–7.7)
Neutrophils Relative %: 62.2 % (ref 43.0–77.0)
Platelets: 250 10*3/uL (ref 150.0–400.0)
RBC: 3.93 Mil/uL (ref 3.87–5.11)
RDW: 13.6 % (ref 11.5–15.5)
WBC: 5.8 10*3/uL (ref 4.0–10.5)

## 2023-02-08 LAB — VITAMIN B12: Vitamin B-12: 178 pg/mL — ABNORMAL LOW (ref 211–911)

## 2023-02-08 LAB — VITAMIN D 25 HYDROXY (VIT D DEFICIENCY, FRACTURES): VITD: 42.98 ng/mL (ref 30.00–100.00)

## 2023-02-08 MED ORDER — HYDROXYCHLOROQUINE SULFATE 200 MG PO TABS: 200.0000 mg | ORAL_TABLET | Freq: Every morning | ORAL | 2 refills | Status: DC

## 2023-02-08 MED ORDER — TRAMADOL HCL 50 MG PO TABS: 50.0000 mg | ORAL_TABLET | Freq: Four times a day (QID) | ORAL | 0 refills | Status: DC | PRN

## 2023-02-08 MED ORDER — HYDROXYCHLOROQUINE SULFATE 100 MG PO TABS: 100.0000 mg | ORAL_TABLET | Freq: Every day | ORAL | 3 refills | Status: DC

## 2023-02-08 MED ORDER — BUPROPION HCL ER (XL) 150 MG PO TB24: 150.0000 mg | ORAL_TABLET | Freq: Every day | ORAL | 3 refills | Status: DC

## 2023-02-08 NOTE — Progress Notes (Unsigned)
   Subjective:   Patient ID: Deanna Fischer, female    DOB: 07/11/1960, 62 y.o.   MRN: [id number]  HPI The patient is a transfer of care with concerns.   Review of Systems  Objective:  Physical Exam  Vitals:   02/08/23 1003  BP: 116/84  Pulse: 83  Temp: 98.7 F (37.1 C)  TempSrc: Oral  SpO2: 98%  Weight: 136 lb (61.7 kg)  Height: 5\' 5"  (1.651 m)    Assessment & Plan:

## 2023-02-08 NOTE — Telephone Encounter (Signed)
FYI.Deanna KitchenMarland KitchenPt called stating she will be ready to go over her results when they are available.

## 2023-02-08 NOTE — Patient Instructions (Addendum)
We have sent in the tramadol for pain and the wellbutrin 150 mg daily.   After 4 weeks of wellbutirn you can increase to 300 mg daily if wanted (2 pills daily).   We will increase the plaquenil to 300 mg daily (1.5 pills)  We will check the labs today.

## 2023-02-09 NOTE — Assessment & Plan Note (Signed)
Recent labs stable will continue synthroid 50 mcg daily.

## 2023-02-09 NOTE — Assessment & Plan Note (Signed)
Checking B12 levels today as not checked in some time. She is not taking oral now. If low will plan weekly B12 for 1 month then oral B12 1000 mcg daily lifelong.

## 2023-02-09 NOTE — Assessment & Plan Note (Signed)
Getting appropriate yearly follow up and in remission.

## 2023-02-09 NOTE — Assessment & Plan Note (Signed)
Checking vitamin D level today. She is taking otc but irregularly.

## 2023-02-09 NOTE — Assessment & Plan Note (Signed)
With current flare. At baseline taking 200 mg plaquenil daily. Will increase to 300 mg plaquenil daily. She is up to date on eye exam and will get records. Referral to rheumatology done. She was diagnosed in [place] in her late 30s so likely will be impossible to get original diagnosis but this is clearly established. Rx plaquenil until she can establish with rheumatology. If flare is not improving will rx prednisone.

## 2023-02-09 NOTE — Assessment & Plan Note (Signed)
With b12 deficiency. Checking CBC and adjust as needed and B12.

## 2023-02-09 NOTE — Assessment & Plan Note (Signed)
It is not clear to me if her prior fractures are considered fragility fractures. Her z score was -2.4. We discussed this and will hold on medication. Repeat DEXA 2025 and if any change will initiate treatment. We discussed prolia and fosamax as main options with benefit/risk to each.

## 2023-02-09 NOTE — Telephone Encounter (Signed)
Please inform patient standard policy that these will be available on mychart and typically within 2 days of the results coming back.

## 2023-02-09 NOTE — Assessment & Plan Note (Signed)
Overall worse than usual. Will maintain lexapro 20 mg daily. She has added back wellbutrin 150 mg daily in the last 1-2 weeks. Will maintain that for 4 weeks and if not controlled increase to 300 mg wellbutrin. Rx done today and given instructions. Follow up 4 weeks and adjust as needed. We talked about if wellbutrin helping more to adjust to wellbutrin 300 mg and lexapro 10 mg daily if side effects to higher doses.

## 2023-02-10 ENCOUNTER — Telehealth: Payer: Self-pay

## 2023-02-10 NOTE — Telephone Encounter (Signed)
*  Primary  Pharmacy Patient Advocate Encounter   Received notification from CoverMyMeds that prior authorization for Hydroxychloroquine Sulfate 100MG  tablets  is required/requested.   Insurance verification completed.   The patient is insured through [facility] .   Per test claim: PA required; PA submitted to [facility] via CoverMyMeds Key/confirmation #/EOC [id number] Status is pending

## 2023-02-15 ENCOUNTER — Ambulatory Visit: Payer: Medicare Other

## 2023-02-16 ENCOUNTER — Encounter (INDEPENDENT_AMBULATORY_CARE_PROVIDER_SITE_OTHER): Payer: Self-pay

## 2023-02-16 ENCOUNTER — Ambulatory Visit: Payer: Medicare Other

## 2023-02-17 ENCOUNTER — Ambulatory Visit: Payer: Medicare Other

## 2023-02-17 NOTE — Telephone Encounter (Signed)
Pharmacy Patient Advocate Encounter  Received notification from [facility] that Prior Authorization for Hydroxychloroquine has been DENIED. Please advise how you'd like to proceed. Full denial letter will be uploaded to the media tab. See denial reason below.   Hydroxychloroquine 100 mg is denied because it is not on your plan's Drug List (formulary). Medication authorization requires the following: (1) You need to try two (2) of these covered drugs: (a) Azathioprine 50mg *. (b) Cortisone. (c) Dexamethasone. (d) Hydrocortisone tablet. (e) Methylprednisolone. (f) Mycophenolate mofetil, mycophenolic acid DR, or Myhibbin*. (g) Prednisolone solution or prednisolone sodium phosphate (15mg /16ml, 25mg /43ml) solution. (2) OR your doctor needs to give [place] specific medical reasons why two (2) of the covered drug(s) are not appropriate for you.

## 2023-03-09 ENCOUNTER — Ambulatory Visit: Payer: Medicare Other

## 2023-03-10 ENCOUNTER — Telehealth: Payer: Self-pay | Admitting: Internal Medicine

## 2023-03-10 NOTE — Telephone Encounter (Signed)
She does not need a referral she can contact any therapist she wants. I can place a referral for her if she needs that.

## 2023-03-10 NOTE — Telephone Encounter (Signed)
Patient would like a referral for a therapist specializing in PTSD, grief, and loss. She would prefer a woman. Patient said her mother is in hospice and she has minimal access to her. Patient would like a call back if the referral can be put in or not. Best callback is [phone].

## 2023-03-13 ENCOUNTER — Ambulatory Visit: Payer: Medicare Other

## 2023-03-14 ENCOUNTER — Ambulatory Visit: Payer: Medicare Other | Admitting: Internal Medicine

## 2023-03-14 NOTE — Telephone Encounter (Signed)
Called pt lvm 1st attempt

## 2023-03-16 ENCOUNTER — Ambulatory Visit: Payer: Medicare Other

## 2023-03-17 ENCOUNTER — Telehealth: Payer: Self-pay | Admitting: Gastroenterology

## 2023-03-17 NOTE — Telephone Encounter (Signed)
HI Dr. Chales Abrahams, would you be able to assist with triage call for this patient and advise further. Thanks

## 2023-03-17 NOTE — Telephone Encounter (Signed)
Patient called states she urgently needs to speak with a nurse because she is experiencing a lot of rectal bleeding for a few days maybe weeks now.

## 2023-03-17 NOTE — Telephone Encounter (Signed)
Patient is calling back to urgently speak with a nurse states she has an urgent matter.

## 2023-03-17 NOTE — Telephone Encounter (Signed)
This patient switched care from Dr. Tomasa Rand to Dr. Orvan Falconer and has not been seen since she switched care. Should pt go to supervising or DOD?

## 2023-03-17 NOTE — Telephone Encounter (Signed)
Patient calling back to f/u on previous note. Patient scheduled for OV with a pa on 9/12. Patient still wanting a f/u call from a nurse due to her being very anxious. Please advise.   Thank you

## 2023-03-17 NOTE — Telephone Encounter (Signed)
Pt scheduled to see Bayley 03/27/23 at 11am. Left message for pt to caill back.

## 2023-03-20 NOTE — Telephone Encounter (Signed)
Pt read about appt in mychart.

## 2023-03-27 ENCOUNTER — Ambulatory Visit: Payer: Medicare Other | Admitting: Gastroenterology

## 2023-04-06 ENCOUNTER — Ambulatory Visit: Payer: Medicare Other | Admitting: Internal Medicine

## 2023-05-01 ENCOUNTER — Ambulatory Visit: Payer: Medicare Other | Admitting: Internal Medicine

## 2023-05-03 ENCOUNTER — Encounter (HOSPITAL_BASED_OUTPATIENT_CLINIC_OR_DEPARTMENT_OTHER): Payer: Self-pay

## 2023-05-03 ENCOUNTER — Emergency Department (HOSPITAL_BASED_OUTPATIENT_CLINIC_OR_DEPARTMENT_OTHER)
Admission: EM | Admit: 2023-05-03 | Discharge: 2023-05-03 | Disposition: A | Discharge status: Home / Self Care | Payer: Medicare Other | Attending: Emergency Medicine | Admitting: Emergency Medicine

## 2023-05-03 ENCOUNTER — Emergency Department (HOSPITAL_BASED_OUTPATIENT_CLINIC_OR_DEPARTMENT_OTHER): Payer: Medicare Other

## 2023-05-03 ENCOUNTER — Other Ambulatory Visit: Payer: Self-pay

## 2023-05-03 DIAGNOSIS — R109 Unspecified abdominal pain: Secondary | ICD-10-CM | POA: Diagnosis not present

## 2023-05-03 DIAGNOSIS — M545 Low back pain, unspecified: Secondary | ICD-10-CM | POA: Insufficient documentation

## 2023-05-03 DIAGNOSIS — R112 Nausea with vomiting, unspecified: Secondary | ICD-10-CM | POA: Diagnosis not present

## 2023-05-03 DIAGNOSIS — Z79899 Other long term (current) drug therapy: Secondary | ICD-10-CM | POA: Diagnosis not present

## 2023-05-03 LAB — COMPREHENSIVE METABOLIC PANEL
ALT: 18 U/L (ref 0–44)
AST: 30 U/L (ref 15–41)
Albumin: 4.8 g/dL (ref 3.5–5.0)
Alkaline Phosphatase: 53 U/L (ref 38–126)
Anion gap: 10 (ref 5–15)
BUN: 11 mg/dL (ref 6–20)
CO2: 28 mmol/L (ref 22–32)
Calcium: 10.3 mg/dL (ref 8.9–10.3)
Chloride: 98 mmol/L (ref 98–111)
Creatinine, Ser: 0.68 mg/dL (ref 0.44–1.00)
GFR, Estimated: >60 mL/min (ref >60)
Glucose, Bld: 102 mg/dL — ABNORMAL HIGH (ref 70–99)
Potassium: 4.8 mmol/L (ref 3.5–5.1)
Sodium: 136 mmol/L (ref 135–145)
Total Bilirubin: 0.8 mg/dL (ref 0.3–1.2)
Total Protein: 7.1 g/dL (ref 6.5–8.1)

## 2023-05-03 LAB — CBC WITH DIFFERENTIAL/PLATELET
Abs Immature Granulocytes: 0.02 10*3/uL (ref 0.00–0.07)
Basophils Absolute: 0 10*3/uL (ref 0.0–0.1)
Basophils Relative: 1 %
Eosinophils Absolute: 0.1 10*3/uL (ref 0.0–0.5)
Eosinophils Relative: 1 %
HCT: 35.7 % — ABNORMAL LOW (ref 36.0–46.0)
Hemoglobin: 12.4 g/dL (ref 12.0–15.0)
Immature Granulocytes: 0 %
Lymphocytes Relative: 28 %
Lymphs Abs: 1.5 10*3/uL (ref 0.7–4.0)
MCH: 33.8 pg (ref 26.0–34.0)
MCHC: 34.7 g/dL (ref 30.0–36.0)
MCV: 97.3 fL (ref 80.0–100.0)
Monocytes Absolute: 0.7 10*3/uL (ref 0.1–1.0)
Monocytes Relative: 12 %
Neutro Abs: 3.1 10*3/uL (ref 1.7–7.7)
Neutrophils Relative %: 58 %
Platelets: 175 10*3/uL (ref 150–400)
RBC: 3.67 MIL/uL — ABNORMAL LOW (ref 3.87–5.11)
RDW: 12.9 % (ref 11.5–15.5)
WBC: 5.5 10*3/uL (ref 4.0–10.5)
nRBC: 0 % (ref 0.0–0.2)

## 2023-05-03 LAB — URINALYSIS, W/ REFLEX TO CULTURE (INFECTION SUSPECTED)
Bacteria, UA: NONE SEEN
Bilirubin Urine: NEGATIVE
Glucose, UA: NEGATIVE mg/dL
Hgb urine dipstick: NEGATIVE
Ketones, ur: 15 mg/dL — AB
Leukocytes,Ua: NEGATIVE
Nitrite: NEGATIVE
Protein, ur: NEGATIVE mg/dL
Specific Gravity, Urine: 1.006 (ref 1.005–1.030)
pH: 7 (ref 5.0–8.0)

## 2023-05-03 MED ORDER — KETOROLAC TROMETHAMINE 15 MG/ML IJ SOLN
15.0000 mg | Freq: Once | INTRAMUSCULAR | Status: AC
  Administered 2023-05-03: 15 mg via INTRAVENOUS
  Filled 2023-05-03: qty 1

## 2023-05-03 MED ORDER — ONDANSETRON HCL 4 MG PO TABS: 4.0000 mg | ORAL_TABLET | Freq: Four times a day (QID) | ORAL | 0 refills | Status: AC

## 2023-05-03 MED ORDER — IOHEXOL 300 MG/ML  SOLN
100.0000 mL | Freq: Once | INTRAMUSCULAR | Status: AC | PRN
  Administered 2023-05-03: 100 mL via INTRAVENOUS

## 2023-05-03 MED ORDER — DIPHENHYDRAMINE HCL 25 MG PO CAPS
50.0000 mg | ORAL_CAPSULE | Freq: Once | ORAL | Status: AC
  Administered 2023-05-03: 25 mg via ORAL
  Filled 2023-05-03: qty 2

## 2023-05-03 MED ORDER — MORPHINE SULFATE (PF) 4 MG/ML IV SOLN
4.0000 mg | Freq: Once | INTRAVENOUS | Status: AC
  Administered 2023-05-03: 4 mg via INTRAVENOUS
  Filled 2023-05-03: qty 1

## 2023-05-03 MED ORDER — LACTATED RINGERS IV BOLUS
1000.0000 mL | Freq: Once | INTRAVENOUS | Status: AC
  Administered 2023-05-03: 1000 mL via INTRAVENOUS

## 2023-05-03 MED ORDER — ONDANSETRON HCL 4 MG/2ML IJ SOLN
4.0000 mg | Freq: Once | INTRAMUSCULAR | Status: AC
  Administered 2023-05-03: 4 mg via INTRAVENOUS
  Filled 2023-05-03: qty 2

## 2023-05-03 NOTE — Discharge Instructions (Addendum)
You were seen in the emergency department for your flank pain and your vomiting.  Your workup showed no signs of severe dehydration and no new infection or abscess.  It is unclear what is causing your pain, but you can follow-up with your primary doctor and with the spine specialist for further workup.  You can take Tylenol and Motrin as needed for pain, just would not take Motrin on an empty stomach to prevent abdominal pain and you can take Zofran as needed for nausea.  You should return to the emergency department if you are having fevers, repetitive vomiting despite the Zofran, significantly worsening pain or any other new or concerning symptoms.

## 2023-05-03 NOTE — ED Provider Notes (Signed)
[place] EMERGENCY DEPARTMENT AT [facility] Provider Note   CSN: [id number] Arrival date & time: 05/03/23  1217     History  Chief Complaint  Patient presents with   Back Pain    Deanna Fischer is a 62 y.o. female.  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  She has had no change in her bowel movements and has been urinating normally.  No hematuria or diarrhea.  No fevers.  She has been taking ibuprofen and did take 1 tramadol today.  She is concerned because she reports this is the way it felt the last time she had the abdominal abscess and she was concerned.  Her friend who is present with her also reports she has not been eating.  The history is provided by the patient and medical records.  Back Pain      Home Medications Prior to Admission medications   Medication Sig Start Date End Date Taking? Authorizing Provider  buPROPion (WELLBUTRIN XL) 150 MG 24 hr tablet Take 1 tablet (150 mg total) by mouth daily. 02/08/23   Myrlene Broker, MD  Calcium Carbonate-Vitamin D (CALCIUM-D PO) Take 2 tablets by mouth every morning.    [provider]  escitalopram (LEXAPRO) 20 MG tablet Take 1 tablet (20 mg total) by mouth daily. 02/07/22   Jeani Sow, MD  hydroxychloroquine (PLAQUENIL) 200 MG tablet Take 1 tablet (200 mg total) by mouth every morning. Take with 100 mg for total daily dosing 300 mg 02/08/23   Myrlene Broker, MD  Hydroxychloroquine Sulfate 100 MG TABS Take 100 mg by mouth daily. Take with 200 mg for total dose 300 mg daily 02/08/23   Myrlene Broker, MD  ibuprofen (ADVIL) 200 MG tablet Take 400-800 mg by mouth every 6 (six) hours as needed  (pain).    [provider]  levothyroxine (SYNTHROID) 50 MCG tablet Take 50 mcg by mouth daily before breakfast.    [provider]  Multiple Vitamin (MULTIVITAMIN WITH MINERALS) TABS tablet Take 2 tablets by mouth every morning. Plant based multi vitamin    [provider]  ondansetron (ZOFRAN) 4 MG tablet Take 8 mg by mouth 2 (two) times daily. 12/14/22   [provider]  traMADol (ULTRAM) 50 MG tablet Take 1 tablet (50 mg total) by mouth every 6 (six) hours as needed. 02/08/23   Myrlene Broker, MD  traZODone (DESYREL) 50 MG tablet TAKE 1 TABLET(50 MG) BY MOUTH AT BEDTIME AS NEEDED FOR SLEEP 01/02/23   Jeani Sow, MD      Allergies    Amoxicillin-pot clavulanate, Amoxicillin, and Penicillins    Review of Systems   Review of Systems  Musculoskeletal:  Positive for back pain.    Physical Exam Updated Vital Signs BP (!) 157/86   Pulse 69   Temp 98.5 F (36.9 C) (Oral)   Resp 17   Ht 5\' 5"  (1.651 m)   Wt 56.7 kg   SpO2 100%   BMI 20.80 kg/m  Physical Exam Vitals and nursing note reviewed.  Constitutional:      General: She is not in acute distress.    Appearance: She is well-developed.  HENT:  Head: Normocephalic and atraumatic.  Eyes:     Conjunctiva/sclera: Conjunctivae normal.     Pupils: Pupils are equal, round, and reactive to light.  Cardiovascular:     Rate and Rhythm: Normal rate and regular rhythm.     Heart sounds: No murmur heard. Pulmonary:     Effort: Pulmonary effort is normal. No respiratory distress.     Breath sounds: Normal breath sounds. No wheezing or rales.  Abdominal:     General: There is no distension.     Palpations: Abdomen is soft.     Tenderness: There is no abdominal tenderness. There is right CVA tenderness. There is no guarding or rebound.  Musculoskeletal:        General: No tenderness. Normal range of motion.     Cervical back: Normal range of motion and neck supple.  Skin:    General:  Skin is warm and dry.     Findings: No erythema or rash.  Neurological:     Mental Status: She is alert and oriented to person, place, and time.  Psychiatric:        Behavior: Behavior normal.     ED Results / Procedures / Treatments   Labs (all labs ordered are listed, but only abnormal results are displayed) Labs Reviewed  CBC WITH DIFFERENTIAL/PLATELET  COMPREHENSIVE METABOLIC PANEL  URINALYSIS, W/ REFLEX TO CULTURE (INFECTION SUSPECTED)    EKG None  Radiology No results found.  Procedures Procedures    Medications Ordered in ED Medications  morphine (PF) 4 MG/ML injection 4 mg (has no administration in time range)  ondansetron (ZOFRAN) injection 4 mg (has no administration in time range)    ED Course/ Medical Decision Making/ A&P                                 Medical Decision Making Amount and/or Complexity of Data Reviewed Labs: ordered. Radiology: ordered.  Risk Prescription drug management.   Pt with multiple medical problems and comorbidities and presenting today with a complaint that caries a high risk for morbidity and mortality.  Here today with recurrent right flank pain and now nausea and vomiting.  Could be musculoskeletal as it is more painful with movement but does not have features consistent with radiculopathy.  Also concern for partial small bowel obstruction, recurrent abscess, kidney stone, pyelonephritis.  Low suspicion for perforation.  Also concern for dehydration as patient has had decreased oral intake and will rule out electrolyte abnormalities or AKI.  Patient given IV fluids and pain control.  Labs and imaging are pending.  I have independently interpreted patient's labs and CBC and CMP without acute findings.  Imaging is pending.        Final Clinical Impression(s) / ED Diagnoses Final diagnoses:  None    Rx / DC Orders ED Discharge Orders     None         Gwyneth Sprout, MD 05/03/23 1452

## 2023-05-03 NOTE — ED Triage Notes (Signed)
In for eval of stabbing pain in lower right back, nausea, vomiting, decreased appetite and feeling disoriented. Pain started several weeks ago but worsened yesterday. PMH of abscess in her abd with sepsis last year at [facility]. Normal BM. Denies dysuria.

## 2023-05-03 NOTE — ED Provider Notes (Signed)
Patient signed out to me at 1500 by Dr. Anitra Lauth pending CT read.  In short this is a 62 year old female with a past medical history of lupus, prior abdominal abscess and appendiceal cancer presenting to the emergency department with right sided flank pain for the last 2 weeks and nausea and vomiting that occurred today.  On her arrival she was hemodynamically stable with right-sided CVA tenderness.  She had labs and urine performed that are within normal range, no signs of UTI or pyelonephritis.  Patient was given fluids, nausea and pain control.  CT read is pending at this time.  Clinical Course as of 05/03/23 1706  Wed May 03, 2023  1630 No acute abnormality on CT. Suspect likely musculoskeletal etiology of pain. Patient is stable for discharge home with outpatient follow up. [VK]    Clinical Course User Index [VK] Rexford Maus, DO      Rexford Maus, [place] 05/03/23 [phone]

## 2023-05-03 NOTE — ED Notes (Signed)
Patient transported to CT 

## 2023-05-03 NOTE — ED Notes (Signed)
Pt reported 'eyes swelling' and reports 'forgot she had a previous contrast reaction'. Eyes puffy in appearance, airway intact; denies any SHOB. EDP notified and orders for Benadryl placed. Pt advised not to have any PO intake, but pt states she needs something on her stomach after the nausea medication, is snacking on saltine crackers. Pt ambulatory to restroom and placed back on monitor in room.

## 2023-05-08 ENCOUNTER — Telehealth: Payer: Self-pay | Admitting: Internal Medicine

## 2023-05-08 NOTE — Telephone Encounter (Signed)
Okay to inform I do not have any slots longer than 20 minutes but we will be able to address her concerns.

## 2023-05-08 NOTE — Telephone Encounter (Signed)
Patient has an appointment on 05/15/2023 and would like to know if she can have a longer appointment. She said she has a lot to discuss and thinks it will take longer than the standard 20 minutes. She would like to discuss her recent back pain and new referrals that she needs. Patient would like a call back at [phone].

## 2023-05-09 ENCOUNTER — Encounter: Payer: Self-pay | Admitting: Internal Medicine

## 2023-05-09 NOTE — Telephone Encounter (Signed)
Called patient and informed her that Dr Okey Dupre does not have slots longer than 20 min's she veberalized that she understood

## 2023-05-09 NOTE — Progress Notes (Deleted)
    Subjective:    Patient ID: Deanna Fischer, female    DOB: 13-Sep-1960, 62 y.o.   MRN: [id number]      HPI Abriel is here for No chief complaint on file.    Severe back pain -     Medications and allergies reviewed with patient and updated if appropriate.  Current Outpatient Medications on File Prior to Visit  Medication Sig Dispense Refill   buPROPion (WELLBUTRIN XL) 150 MG 24 hr tablet Take 1 tablet (150 mg total) by mouth daily. 90 tablet 3   Calcium Carbonate-Vitamin D (CALCIUM-D PO) Take 2 tablets by mouth every morning.     escitalopram (LEXAPRO) 20 MG tablet Take 1 tablet (20 mg total) by mouth daily. 90 tablet 3   hydroxychloroquine (PLAQUENIL) 200 MG tablet Take 1 tablet (200 mg total) by mouth every morning. Take with 100 mg for total daily dosing 300 mg 30 tablet 2   Hydroxychloroquine Sulfate 100 MG TABS Take 100 mg by mouth daily. Take with 200 mg for total dose 300 mg daily 30 tablet 3   ibuprofen (ADVIL) 200 MG tablet Take 400-800 mg by mouth every 6 (six) hours as needed (pain).     levothyroxine (SYNTHROID) 50 MCG tablet Take 50 mcg by mouth daily before breakfast.     Multiple Vitamin (MULTIVITAMIN WITH MINERALS) TABS tablet Take 2 tablets by mouth every morning. Plant based multi vitamin     ondansetron (ZOFRAN) 4 MG tablet Take 8 mg by mouth 2 (two) times daily.     ondansetron (ZOFRAN) 4 MG tablet Take 1 tablet (4 mg total) by mouth every 6 (six) hours. 12 tablet 0   traMADol (ULTRAM) 50 MG tablet Take 1 tablet (50 mg total) by mouth every 6 (six) hours as needed. 30 tablet 0   traZODone (DESYREL) 50 MG tablet TAKE 1 TABLET(50 MG) BY MOUTH AT BEDTIME AS NEEDED FOR SLEEP 30 tablet 1   No current facility-administered medications on file prior to visit.    Review of Systems     Objective:  There were no vitals filed for this visit. BP Readings from Last 3 Encounters:  05/03/23 (!) 144/101  02/08/23 116/84  11/22/22 122/78   Wt Readings from Last 3  Encounters:  05/03/23 125 lb (56.7 kg)  02/08/23 136 lb (61.7 kg)  12/08/22 137 lb (62.1 kg)   There is no height or weight on file to calculate BMI.    Physical Exam         Assessment & Plan:    See Problem List for Assessment and Plan of chronic medical problems.

## 2023-05-10 ENCOUNTER — Ambulatory Visit: Payer: Medicare Other | Admitting: Internal Medicine

## 2023-05-15 ENCOUNTER — Ambulatory Visit: Payer: Medicare Other | Admitting: Internal Medicine

## 2023-05-22 ENCOUNTER — Ambulatory Visit: Payer: Medicare Other | Admitting: Internal Medicine

## 2023-05-30 ENCOUNTER — Ambulatory Visit: Payer: Medicare Other | Admitting: Internal Medicine

## 2023-06-13 ENCOUNTER — Ambulatory Visit: Payer: Medicare Other | Admitting: Internal Medicine

## 2023-06-23 ENCOUNTER — Other Ambulatory Visit: Payer: Self-pay | Admitting: Family Medicine

## 2023-06-23 MED ORDER — ESCITALOPRAM OXALATE 20 MG PO TABS: 20.0000 mg | ORAL_TABLET | Freq: Every day | ORAL | 3 refills | Status: AC

## 2023-06-23 NOTE — Addendum Note (Signed)
Addended by: Hillard Danker A on: 06/23/2023 12:52 PM   Modules accepted: Orders

## 2023-07-08 ENCOUNTER — Other Ambulatory Visit: Payer: Medicare Other

## 2023-08-01 ENCOUNTER — Ambulatory Visit (INDEPENDENT_AMBULATORY_CARE_PROVIDER_SITE_OTHER): Payer: Medicare Other | Admitting: Internal Medicine

## 2023-08-01 ENCOUNTER — Encounter: Payer: Self-pay | Admitting: Internal Medicine

## 2023-08-01 VITALS — BP 140/86 | HR 76 | Temp 99.1°F | Ht 65.0 in | Wt 140.0 lb

## 2023-08-01 DIAGNOSIS — M85859 Other specified disorders of bone density and structure, unspecified thigh: Secondary | ICD-10-CM | POA: Diagnosis not present

## 2023-08-01 DIAGNOSIS — M328 Other forms of systemic lupus erythematosus: Secondary | ICD-10-CM

## 2023-08-01 DIAGNOSIS — F4310 Post-traumatic stress disorder, unspecified: Secondary | ICD-10-CM | POA: Diagnosis not present

## 2023-08-01 DIAGNOSIS — E038 Other specified hypothyroidism: Secondary | ICD-10-CM | POA: Diagnosis not present

## 2023-08-01 DIAGNOSIS — E785 Hyperlipidemia, unspecified: Secondary | ICD-10-CM | POA: Diagnosis not present

## 2023-08-01 DIAGNOSIS — E538 Deficiency of other specified B group vitamins: Secondary | ICD-10-CM

## 2023-08-01 MED ORDER — HYDROXYCHLOROQUINE SULFATE 200 MG PO TABS: 200.0000 mg | ORAL_TABLET | Freq: Every morning | ORAL | 2 refills | Status: DC

## 2023-08-01 MED ORDER — TRAMADOL HCL 50 MG PO TABS: 50.0000 mg | ORAL_TABLET | Freq: Four times a day (QID) | ORAL | 0 refills | Status: DC | PRN

## 2023-08-01 MED ORDER — HYDROXYCHLOROQUINE SULFATE 100 MG PO TABS: 100.0000 mg | ORAL_TABLET | Freq: Every day | ORAL | 3 refills | Status: DC

## 2023-08-01 MED ORDER — TRAZODONE HCL 50 MG PO TABS: ORAL_TABLET | ORAL | 1 refills | Status: DC

## 2023-08-01 MED ORDER — BUPROPION HCL ER (XL) 150 MG PO TB24: 150.0000 mg | ORAL_TABLET | Freq: Every day | ORAL | 3 refills | Status: DC

## 2023-08-01 MED ORDER — CYANOCOBALAMIN 1000 MCG/ML IJ SOLN
1000.0000 ug | Freq: Once | INTRAMUSCULAR | Status: AC
  Administered 2023-08-01: 1000 ug via INTRAMUSCULAR

## 2023-08-01 NOTE — Progress Notes (Signed)
   Subjective:   Patient ID: Deanna Fischer, female    DOB: 1961/03/12, 63 y.o.   MRN: [id number]  HPI The patient is a 63 YO female coming in for concerns about mental health (struggling a lot and has been on meds over the years, went back on wellbutrin after last visit and it helped but then she stopped, has no counselor currently, some passive SI but no active and no plans). Also needs follow up lupus (cannot find adequate rheumatologist, eye exam due March 2025 not scheduled yet) and bone density (prior fractures and needs follow up).   Review of Systems  Constitutional: Negative.   HENT: Negative.    Eyes: Negative.   Respiratory:  Negative for cough, chest tightness and shortness of breath.   Cardiovascular:  Negative for chest pain, palpitations and leg swelling.  Gastrointestinal:  Negative for abdominal distention, abdominal pain, constipation, diarrhea, nausea and vomiting.  Musculoskeletal: Negative.   Skin: Negative.   Neurological: Negative.   Psychiatric/Behavioral:  Positive for decreased concentration and dysphoric mood. The patient is nervous/anxious.     Objective:  Physical Exam Constitutional:      Appearance: She is well-developed.  HENT:     Head: Normocephalic and atraumatic.  Cardiovascular:     Rate and Rhythm: Normal rate and regular rhythm.  Pulmonary:     Effort: Pulmonary effort is normal. No respiratory distress.     Breath sounds: Normal breath sounds. No wheezing or rales.  Abdominal:     General: Bowel sounds are normal. There is no distension.     Palpations: Abdomen is soft.     Tenderness: There is no abdominal tenderness. There is no rebound.  Musculoskeletal:        General: Tenderness present.     Cervical back: Normal range of motion.  Skin:    General: Skin is warm and dry.  Neurological:     Mental Status: She is alert and oriented to person, place, and time.     Coordination: Coordination normal.     Vitals:   08/01/23 1501  BP:  (!) 140/86  Pulse: 76  Temp: 99.1 F (37.3 C)  TempSrc: Oral  SpO2: 98%  Weight: 140 lb (63.5 kg)  Height: 5\' 5"  (1.651 m)    Assessment & Plan:  Visit time 35 minutes in face to face communication with patient and coordination of care, additional 10 minutes spent in record review, coordination or care, ordering tests, communicating/referring to other healthcare professionals, documenting in medical records all on the same day of the visit for total time 45 minutes spent on the visit.

## 2023-08-01 NOTE — Patient Instructions (Addendum)
We have refilled the medicines including the wellbutrin.  We will do the B12 shots including one today.

## 2023-08-04 NOTE — Assessment & Plan Note (Signed)
Ordered bone density to assess current state.

## 2023-08-04 NOTE — Assessment & Plan Note (Signed)
Given B12 Im and plan for 4 weekly shots as she did not follow up after last labs. This may help with fatigue.

## 2023-08-04 NOTE — Assessment & Plan Note (Signed)
Checking lipid panel and adjust as needed not on meds.

## 2023-08-04 NOTE — Assessment & Plan Note (Signed)
Checking TSH and adjust synthroid 50 mcg daily as needed. 

## 2023-08-04 NOTE — Assessment & Plan Note (Signed)
Overall with flare and severe symptoms. Rx wellbutrin 150 mg daily and follow up in 3-4 weeks. She will continue trazdone 50 mg at bedtime as well and lexapro 20 mg daily. Asked her to seek counseling as well.

## 2023-08-04 NOTE — Assessment & Plan Note (Signed)
No flare and now using plaquenil 200 mg daily (used 300 mg daily in flare). Refilled for 3 month but will need updated eye exam for further.

## 2023-08-07 DIAGNOSIS — M67911 Unspecified disorder of synovium and tendon, right shoulder: Secondary | ICD-10-CM | POA: Diagnosis not present

## 2023-08-08 ENCOUNTER — Other Ambulatory Visit: Payer: Self-pay | Admitting: Internal Medicine

## 2023-08-08 NOTE — Telephone Encounter (Signed)
 Copied from CRM [id number]. Topic: Clinical - Medication Refill >> Aug 08, 2023  5:25 PM Drema MATSU wrote: Most Recent Primary Care Visit:  Provider: ROLLENE NORRIS A  Department: [facility]  Visit Type: PHYSICAL  Date: 08/01/2023  Medication: levothyroxine  (SYNTHROID ) 50 MCG tablet  Has the patient contacted their pharmacy? Yes (Agent: If no, request that the patient contact the pharmacy for the refill. If patient does not wish to contact the pharmacy document the reason why and proceed with request.) (Agent: If yes, when and what did the pharmacy advise?)  Is this the correct pharmacy for this prescription? Yes If no, delete pharmacy and type the correct one.  This is the patient's preferred pharmacy:  [id number] [place], [place] - [address] AT [facility] [address] [facility] [address] [facility] [place] [facility] [address] [place] [address] Phone: [phone] Fax: [phone]   Has the prescription been filled recently? No  Is the patient out of the medication? Yes been out 3 days  Has the patient been seen for an appointment in the last year OR does the patient have an upcoming appointment? Yes  Can we respond through MyChart? Yes  Agent: Please be advised that Rx refills may take up to 3 business days. We ask that you follow-up with your pharmacy.

## 2023-08-08 NOTE — Telephone Encounter (Signed)
Last Fill: Unknown  Last OV: 02/08/23 Next OV: 08/18/23  Routing to provider for review/authorization.

## 2023-08-09 ENCOUNTER — Ambulatory Visit: Payer: Medicare Other

## 2023-08-10 MED ORDER — LEVOTHYROXINE SODIUM 50 MCG PO TABS: 50.0000 ug | ORAL_TABLET | Freq: Every day | ORAL | 0 refills | Status: DC

## 2023-08-11 ENCOUNTER — Other Ambulatory Visit: Payer: Medicare Other

## 2023-08-11 ENCOUNTER — Ambulatory Visit: Payer: Medicare Other

## 2023-08-14 ENCOUNTER — Ambulatory Visit: Payer: Medicare Other

## 2023-08-18 ENCOUNTER — Ambulatory Visit: Payer: Medicare Other | Admitting: Internal Medicine

## 2023-08-26 ENCOUNTER — Other Ambulatory Visit: Payer: Medicare Other

## 2023-09-15 ENCOUNTER — Ambulatory Visit

## 2023-09-23 ENCOUNTER — Inpatient Hospital Stay: Admission: RE | Admit: 2023-09-23 | Payer: Medicare Other | Source: Ambulatory Visit

## 2023-11-07 ENCOUNTER — Ambulatory Visit: Admitting: Internal Medicine

## 2023-11-13 DIAGNOSIS — B349 Viral infection, unspecified: Secondary | ICD-10-CM | POA: Diagnosis not present

## 2023-11-13 DIAGNOSIS — Z20822 Contact with and (suspected) exposure to covid-19: Secondary | ICD-10-CM | POA: Diagnosis not present

## 2023-12-27 ENCOUNTER — Ambulatory Visit

## 2024-01-03 ENCOUNTER — Ambulatory Visit

## 2024-01-08 ENCOUNTER — Ambulatory Visit

## 2024-01-11 ENCOUNTER — Ambulatory Visit

## 2024-01-29 ENCOUNTER — Other Ambulatory Visit: Payer: Self-pay | Admitting: Internal Medicine

## 2024-01-31 ENCOUNTER — Ambulatory Visit: Payer: Self-pay

## 2024-01-31 NOTE — Telephone Encounter (Signed)
 FYI Only or Action Required?: Action required by provider: update on patient condition and asking about transportation assistance. Patient is asking if she should come in for labs this Friday 02/02/24 morning so that she can be fasted?  Patient was last seen in primary care on 08/01/2023 by Deanna Fischer LABOR, MD.  Called Nurse Triage reporting Post-Traumatic Stress Disorder.  Symptoms began several years ago.  Interventions attempted: Other: previous counseling.  Symptoms are: unchanged.  Triage Disposition: No disposition on file.  Patient/caregiver understands and will follow disposition?:  Answer Assessment - Initial Assessment Questions Deanna Fischer reports she is embarrassed that she has been unable to attend appointments and is concerned that Dr. Rollene will be angry with her. This RN advised that we appreciate her calling to explain the situation, that no one is upset with her and that we would like to help her make it to her upcoming visit. Patient asking if we offer assistance for transportation, advised I would pass question along to office. Deanna Fischer states that she will take an Deanna Fischer if assistance is not available.   Patient wants noted that pt is seeing oncologist in Sept 10th for full body scan with Dr. Claryce d/t to cancer hx with labs done prior.   1. CONCERN: What happened that made you call today?     Complex PTSD diagnosis and states it is difficult to come to her appointments      3. RISK OF HARM - SUICIDAL IDEATION:  Do you ever have thoughts of hurting or killing yourself?  (e.g., yes, no, no but preoccupation with thoughts about death)     Denies  4. RISK OF HARM - HOMICIDAL IDEATION:  Do you ever have thoughts of hurting or killing someone else?  (e.g., yes, no, no but preoccupation with thoughts about death)     Denies  5. FUNCTIONAL IMPAIRMENT: How have things been going for you overall? Have you had more difficulty than usual doing your normal daily activities?   (e.g., better, same, worse; self-care, school, work, interactions)     Prevents her from coming to her in person appointments. States barely leaving the house.  6. SUPPORT: Who is with you now? Who do you live with? Do you have family or friends who you can talk to?      Deanna Fischer, has a few friends in [place] or [place]  - not local  7. THERAPIST: Do you have a counselor or therapist? If Yes, ask: What is their name?     Not currently, previously tx at [facility]. They recommended EMDR, but was not able to scheduled because she was told the provider was booked. [name] frustrated as she wants to begin EMDR.       9. ALCOHOL USE OR SUBSTANCE USE (DRUG USE): Do you drink alcohol or use any illegal drugs?     States in the past, is in recovery and attends Zoom support meetings  Protocols used: Depression-A-AH Copied from CRM [id number]. Topic: Clinical - Red Word Triage >> Jan 31, 2024 10:33 AM Deanna Fischer BIRCH wrote: Reason for CRM: patient called stating she was diagnosed with complex PTSD, depression and anxiety. Patient has been suffering for some time now. Patient is afraid to leave her house and she is sorry she canceled her appointment. Patient  has had more and more trouble leaving the house and making any appointments out of fear. Patient stated she can not drive so she takes an Pharmacist, community. Patient want to review her  medication and she is struggling and is so sorry and embarrassed that she has not been in sooner. Patient state she is not suicidal and she need a referral to psychiatrist and EMDR specialist.

## 2024-02-01 NOTE — Telephone Encounter (Signed)
**Note De-identified  [place] Obfuscation** Please advise 

## 2024-02-02 NOTE — Telephone Encounter (Signed)
 Ok to come just for visit without separate lab visit. Ok to provide transportation assistance per our office procedure.

## 2024-02-05 ENCOUNTER — Encounter: Payer: Self-pay | Admitting: Internal Medicine

## 2024-02-05 ENCOUNTER — Telehealth: Admitting: Internal Medicine

## 2024-02-05 NOTE — Progress Notes (Signed)
 Virtual Visit via Video Note  I connected with Loleta Frommelt on 02/05/24 at  2:40 PM EDT by a video enabled telemedicine application but we were unable to get connected

## 2024-02-06 ENCOUNTER — Telehealth: Payer: Self-pay

## 2024-02-06 NOTE — Telephone Encounter (Signed)
 Copied from CRM [id number]. Topic: Appointments - Scheduling Inquiry for Clinic >> Feb 06, 2024 12:53 PM Deaijah H wrote: Reason for CRM: Patient called in to speak with Dr. Rogena nurse regarding upcoming appointment.Deanna Fischer would like to know if she can do a telehealth appointment on this Thursday 8/7 or upcoming Monday 8/11 (instead of 8/25). If telehealth is not available she would like to know may she come in next week. Please call [phone]

## 2024-02-07 NOTE — Telephone Encounter (Signed)
 I have already advised the patient about appointments and she denied appointment for this week due to her having other appointment. Patient will need to keep same appointment.  Thuy Atilano,CMA

## 2024-02-09 DIAGNOSIS — R053 Chronic cough: Secondary | ICD-10-CM | POA: Diagnosis not present

## 2024-02-14 ENCOUNTER — Telehealth: Payer: Self-pay

## 2024-02-14 NOTE — Telephone Encounter (Signed)
 I believe pt is referring to the scan she had done on 8/8 it look like she with through [facility] with this

## 2024-02-14 NOTE — Telephone Encounter (Signed)
 Copied from CRM [id number]. Topic: Clinical - Lab/Test Results >> Feb 13, 2024  4:50 PM Carlyon D wrote: Reason for CRM: Pt is calling in regards to her CT results. She is saying they told her no lesions showed but she is stating she should be worried about her heart due what the scan is showing she is wanting some one to reach out to her in regards to this and explain it to her as she is very confused she is very concerned. Pt is very concerned about the reading on her heart and immediatly wants to be referred to a cardiologist. Pt looks forward to seeing Dr. Rollene on 8/25.

## 2024-02-14 NOTE — Telephone Encounter (Signed)
 I do see there is some plaque in the arteries of the heart. We can discuss at visit. This is a common finding and typically does not require any further work up or a cardiologist.

## 2024-02-17 ENCOUNTER — Other Ambulatory Visit: Payer: Self-pay | Admitting: Internal Medicine

## 2024-02-19 DIAGNOSIS — Z79899 Other long term (current) drug therapy: Secondary | ICD-10-CM | POA: Diagnosis not present

## 2024-02-20 NOTE — Telephone Encounter (Signed)
 We do not have any recent eye exam it is not safe to prescribe without that information and cannot refill

## 2024-02-22 DIAGNOSIS — H5203 Hypermetropia, bilateral: Secondary | ICD-10-CM | POA: Diagnosis not present

## 2024-02-22 DIAGNOSIS — Z79899 Other long term (current) drug therapy: Secondary | ICD-10-CM | POA: Diagnosis not present

## 2024-02-22 DIAGNOSIS — H53002 Unspecified amblyopia, left eye: Secondary | ICD-10-CM | POA: Diagnosis not present

## 2024-02-22 DIAGNOSIS — H2513 Age-related nuclear cataract, bilateral: Secondary | ICD-10-CM | POA: Diagnosis not present

## 2024-02-26 ENCOUNTER — Telehealth: Payer: Self-pay

## 2024-02-26 ENCOUNTER — Encounter: Payer: Self-pay | Admitting: Internal Medicine

## 2024-02-26 ENCOUNTER — Ambulatory Visit (INDEPENDENT_AMBULATORY_CARE_PROVIDER_SITE_OTHER): Admitting: Internal Medicine

## 2024-02-26 VITALS — BP 124/78 | HR 80 | Temp 98.7°F | Resp 18 | Ht 65.0 in | Wt 141.0 lb

## 2024-02-26 DIAGNOSIS — R9230 Dense breasts, unspecified: Secondary | ICD-10-CM | POA: Diagnosis not present

## 2024-02-26 DIAGNOSIS — D373 Neoplasm of uncertain behavior of appendix: Secondary | ICD-10-CM | POA: Diagnosis not present

## 2024-02-26 DIAGNOSIS — E038 Other specified hypothyroidism: Secondary | ICD-10-CM

## 2024-02-26 DIAGNOSIS — M85859 Other specified disorders of bone density and structure, unspecified thigh: Secondary | ICD-10-CM | POA: Diagnosis not present

## 2024-02-26 DIAGNOSIS — I2510 Atherosclerotic heart disease of native coronary artery without angina pectoris: Secondary | ICD-10-CM

## 2024-02-26 DIAGNOSIS — F4310 Post-traumatic stress disorder, unspecified: Secondary | ICD-10-CM | POA: Diagnosis not present

## 2024-02-26 DIAGNOSIS — E785 Hyperlipidemia, unspecified: Secondary | ICD-10-CM | POA: Diagnosis not present

## 2024-02-26 DIAGNOSIS — E538 Deficiency of other specified B group vitamins: Secondary | ICD-10-CM

## 2024-02-26 DIAGNOSIS — E559 Vitamin D deficiency, unspecified: Secondary | ICD-10-CM | POA: Diagnosis not present

## 2024-02-26 DIAGNOSIS — M328 Other forms of systemic lupus erythematosus: Secondary | ICD-10-CM

## 2024-02-26 LAB — CBC
HCT: 40.2 % (ref 36.0–46.0)
Hemoglobin: 13.9 g/dL (ref 12.0–15.0)
MCHC: 34.5 g/dL (ref 30.0–36.0)
MCV: 101.4 fl — ABNORMAL HIGH (ref 78.0–100.0)
Platelets: 192 K/uL (ref 150.0–400.0)
RBC: 3.96 Mil/uL (ref 3.87–5.11)
RDW: 13.5 % (ref 11.5–15.5)
WBC: 6 K/uL (ref 4.0–10.5)

## 2024-02-26 LAB — TSH: TSH: 1.32 u[IU]/mL (ref 0.35–5.50)

## 2024-02-26 LAB — COMPREHENSIVE METABOLIC PANEL WITH GFR
ALT: 23 U/L (ref 0–35)
AST: 33 U/L (ref 0–37)
Albumin: 4.5 g/dL (ref 3.5–5.2)
Alkaline Phosphatase: 58 U/L (ref 39–117)
BUN: 10 mg/dL (ref 6–23)
CO2: 28 meq/L (ref 19–32)
Calcium: 9.3 mg/dL (ref 8.4–10.5)
Chloride: 103 meq/L (ref 96–112)
Creatinine, Ser: 0.61 mg/dL (ref 0.40–1.20)
GFR: 96.56 mL/min (ref >60.00)
Glucose, Bld: 88 mg/dL (ref 70–99)
Potassium: 4.3 meq/L (ref 3.5–5.1)
Sodium: 143 meq/L (ref 135–145)
Total Bilirubin: 0.4 mg/dL (ref 0.2–1.2)
Total Protein: 7.3 g/dL (ref 6.0–8.3)

## 2024-02-26 LAB — VITAMIN B12: Vitamin B-12: 498 pg/mL (ref 211–911)

## 2024-02-26 LAB — LIPID PANEL
Cholesterol: 219 mg/dL — ABNORMAL HIGH (ref 0–200)
HDL: 88 mg/dL (ref >39.00)
LDL Cholesterol: 116 mg/dL — ABNORMAL HIGH (ref 0–99)
NonHDL: 131.07
Total CHOL/HDL Ratio: 2
Triglycerides: 77 mg/dL (ref 0.0–149.0)
VLDL: 15.4 mg/dL (ref 0.0–40.0)

## 2024-02-26 LAB — T4, FREE: Free T4: 0.59 ng/dL — ABNORMAL LOW (ref 0.60–1.60)

## 2024-02-26 LAB — VITAMIN D 25 HYDROXY (VIT D DEFICIENCY, FRACTURES): VITD: 49.88 ng/mL (ref 30.00–100.00)

## 2024-02-26 MED ORDER — HYDROXYCHLOROQUINE SULFATE 200 MG PO TABS: 200.0000 mg | ORAL_TABLET | Freq: Every morning | ORAL | 2 refills | Status: AC

## 2024-02-26 MED ORDER — FLUOXETINE HCL 10 MG PO CAPS: 10.0000 mg | ORAL_CAPSULE | Freq: Every day | ORAL | 3 refills | Status: DC

## 2024-02-26 MED ORDER — ROSUVASTATIN CALCIUM 5 MG PO TABS
5.0000 mg | ORAL_TABLET | Freq: Every day | ORAL | 3 refills | Status: DC
Start: 2024-02-26 — End: 2024-04-23

## 2024-02-26 MED ORDER — TRAMADOL HCL 50 MG PO TABS: 50.0000 mg | ORAL_TABLET | Freq: Four times a day (QID) | ORAL | 0 refills | Status: AC | PRN

## 2024-02-26 NOTE — Telephone Encounter (Signed)
 Copied from CRM #[id number]. Topic: General - Other >> Feb 26, 2024 11:55 AM Burnard DEL wrote: Reason for CRM: [facility] called in regards to imaging order for MRI bilateral WO contrast. They stated that she needs an or that says with and without in order  Or them to be able to check for breast cancer.

## 2024-02-26 NOTE — Assessment & Plan Note (Signed)
 A recent CT scan showed no concerning lung findings, only mild pulmonary scarring. Continue follow-up with oncologist Dr. Dallas Dan and perform blood marker tests through [facility].

## 2024-02-26 NOTE — Assessment & Plan Note (Signed)
 Osteopenia is noted with a history of falls and fractures. A bone density test is due. A recent CT scan did not show demineralization, but degenerative changes were noted in the spine. Order a bone density test and review results to determine the need for osteoporosis medication.

## 2024-02-26 NOTE — Assessment & Plan Note (Signed)
 Complex PTSD is exacerbated by past trauma and recent medical experiences. Major depressive disorder symptoms have increased, potentially linked to Wellbutrin . Lexapro  has been beneficial previously and is taking currently. Consider EMDR therapy as a non-pharmacological treatment. Taper off Wellbutrin  and continue Lexapro . Starting Prozac  at 10 mg for one month, increasing to 20 mg if needed. She is scheduled with an EMDR therapy specialist and provide information on local therapists.

## 2024-02-26 NOTE — Assessment & Plan Note (Signed)
 Reminded about eye exam and she is overwhelmed by mental health recently stable on plaquenil  200 mg daily. Will need eye exam for ongoing use.

## 2024-02-26 NOTE — Patient Instructions (Addendum)
 We will get the bone density.   We will stop the wellbutrin  so do 1/2 pill daily for next week then stop.  We can start the prozac  10 mg daily.

## 2024-02-26 NOTE — Assessment & Plan Note (Signed)
 Moderate ASCVD is present with a history of smoking and family heart disease. Discussed the benefits and risks of statin therapy. Start rosuvastatin  5 mg daily. Check cholesterol levels and monitor for chest pain or cardiovascular symptoms.

## 2024-02-26 NOTE — Assessment & Plan Note (Addendum)
 Vitamin B12 deficiency is indicated by fatigue and pallor. Previous B12 injections were inconsistent. Check B12 levels and continue sublingual B12 supplements.
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
## 2024-02-26 NOTE — Assessment & Plan Note (Signed)
 Needs MRI breast which is ordered today. She is overdue for follow up and past biopsy.

## 2024-02-26 NOTE — Telephone Encounter (Signed)
 They want a With and Without order

## 2024-02-27 ENCOUNTER — Other Ambulatory Visit: Payer: Self-pay | Admitting: Internal Medicine

## 2024-02-27 DIAGNOSIS — Z1231 Encounter for screening mammogram for malignant neoplasm of breast: Secondary | ICD-10-CM

## 2024-02-27 NOTE — Telephone Encounter (Signed)
Thanks; ordered

## 2024-02-28 ENCOUNTER — Ambulatory Visit: Payer: Self-pay | Admitting: Internal Medicine

## 2024-02-28 DIAGNOSIS — E038 Other specified hypothyroidism: Secondary | ICD-10-CM

## 2024-02-28 MED ORDER — LEVOTHYROXINE SODIUM 75 MCG PO TABS: 75.0000 ug | ORAL_TABLET | Freq: Every day | ORAL | 3 refills | Status: AC

## 2024-02-29 ENCOUNTER — Ambulatory Visit

## 2024-03-01 ENCOUNTER — Ambulatory Visit
Admission: RE | Admit: 2024-03-01 | Discharge: 2024-03-01 | Disposition: A | Discharge status: Home / Self Care | Source: Ambulatory Visit | Attending: Internal Medicine | Admitting: Internal Medicine

## 2024-03-01 ENCOUNTER — Ambulatory Visit

## 2024-03-01 DIAGNOSIS — Z1231 Encounter for screening mammogram for malignant neoplasm of breast: Secondary | ICD-10-CM

## 2024-03-07 ENCOUNTER — Ambulatory Visit: Payer: Self-pay | Admitting: Internal Medicine

## 2024-03-07 LAB — HM MAMMOGRAPHY

## 2024-03-11 DIAGNOSIS — D121 Benign neoplasm of appendix: Secondary | ICD-10-CM | POA: Diagnosis not present

## 2024-03-13 DIAGNOSIS — D121 Benign neoplasm of appendix: Secondary | ICD-10-CM | POA: Diagnosis not present

## 2024-03-13 DIAGNOSIS — N200 Calculus of kidney: Secondary | ICD-10-CM | POA: Diagnosis not present

## 2024-03-13 DIAGNOSIS — K429 Umbilical hernia without obstruction or gangrene: Secondary | ICD-10-CM | POA: Diagnosis not present

## 2024-03-13 DIAGNOSIS — C181 Malignant neoplasm of appendix: Secondary | ICD-10-CM | POA: Diagnosis not present

## 2024-03-13 DIAGNOSIS — Z9049 Acquired absence of other specified parts of digestive tract: Secondary | ICD-10-CM | POA: Diagnosis not present

## 2024-03-13 DIAGNOSIS — I700 Atherosclerosis of aorta: Secondary | ICD-10-CM | POA: Diagnosis not present

## 2024-03-13 DIAGNOSIS — Q8909 Congenital malformations of spleen: Secondary | ICD-10-CM | POA: Diagnosis not present

## 2024-03-13 DIAGNOSIS — D373 Neoplasm of uncertain behavior of appendix: Secondary | ICD-10-CM | POA: Diagnosis not present

## 2024-03-13 DIAGNOSIS — K5730 Diverticulosis of large intestine without perforation or abscess without bleeding: Secondary | ICD-10-CM | POA: Diagnosis not present

## 2024-03-25 ENCOUNTER — Other Ambulatory Visit

## 2024-03-29 ENCOUNTER — Other Ambulatory Visit: Payer: Self-pay | Admitting: Internal Medicine

## 2024-03-29 MED ORDER — BUPROPION HCL ER (XL) 150 MG PO TB24: 150.0000 mg | ORAL_TABLET | Freq: Every day | ORAL | 3 refills | Status: AC

## 2024-03-29 NOTE — Telephone Encounter (Signed)
 Copied from CRM [id number]. Topic: Clinical - Medication Refill >> Mar 29, 2024 11:49 AM Turkey A wrote: Medication: buPROPion  (WELLBUTRIN  XL) 150 MG 24 hr tablet  Has the patient contacted their pharmacy? No (Agent: If no, request that the patient contact the pharmacy for the refill. If patient does not wish to contact the pharmacy document the reason why and proceed with request.) (Agent: If yes, when and what did the pharmacy advise?)  This is the patient's preferred pharmacy:  [id number] [place], [place] - [address] AT [facility] OF [address] & [facility] [address] [place] [address] Phone: [phone] Fax: [phone]  Is this the correct pharmacy for this prescription? Yes If no, delete pharmacy and type the correct one.   Has the prescription been filled recently? No  Is the patient out of the medication? No  Has the patient been seen for an appointment in the last year OR does the patient have an upcoming appointment? Yes  Can we respond through MyChart? Yes  Agent: Please be advised that Rx refills may take up to 3 business days. We ask that you follow-up with your pharmacy.

## 2024-04-11 DIAGNOSIS — H6123 Impacted cerumen, bilateral: Secondary | ICD-10-CM | POA: Diagnosis not present

## 2024-04-13 ENCOUNTER — Ambulatory Visit
Admission: RE | Admit: 2024-04-13 | Discharge: 2024-04-13 | Disposition: A | Source: Ambulatory Visit | Attending: Internal Medicine | Admitting: Internal Medicine

## 2024-04-13 DIAGNOSIS — Z1239 Encounter for other screening for malignant neoplasm of breast: Secondary | ICD-10-CM | POA: Diagnosis not present

## 2024-04-13 DIAGNOSIS — R9230 Dense breasts, unspecified: Secondary | ICD-10-CM

## 2024-04-13 DIAGNOSIS — Z803 Family history of malignant neoplasm of breast: Secondary | ICD-10-CM | POA: Diagnosis not present

## 2024-04-13 MED ORDER — GADOPICLENOL 0.5 MMOL/ML IV SOLN
6.0000 mL | Freq: Once | INTRAVENOUS | Status: AC | PRN
  Administered 2024-04-13: 6 mL via INTRAVENOUS

## 2024-04-15 ENCOUNTER — Ambulatory Visit: Payer: Self-pay | Admitting: Internal Medicine

## 2024-04-16 LAB — HM MAMMOGRAPHY

## 2024-04-19 ENCOUNTER — Telehealth: Payer: Self-pay | Admitting: Gastroenterology

## 2024-04-19 NOTE — Telephone Encounter (Signed)
 Called pt back, no answer. Left message for pt to call back.

## 2024-04-19 NOTE — Telephone Encounter (Signed)
 Inbound call from patient stating she has been experiencing extreme nausea and weight loss due to not being able to eat. Patient is scheduled for a office visit with Dr.Cunningham on 06/07/24 to discuss endoscopy. Patient would like to know if theres a sooner appointment for her to be seen by Dr.Cunningham himself. Patient also stated she's been taking zofran  and understands she hasn't been seen since 2023 but would like to know if she can be sent or given samples until next availability with provider.  Requesting a call back  Please advise  Thank you

## 2024-04-22 NOTE — Telephone Encounter (Signed)
 Pt with hx of appendiceal ca. She reports she is having issues with nausea and is concerned she may have an ulcer. States she cannot take zofran  as it makes her sleepy. She thinks she needs and EGD. Pt scheduled to see Dr. Stacia 04/23/24 at 9:10am, pt aware of appt.

## 2024-04-23 ENCOUNTER — Encounter: Payer: Self-pay | Admitting: Gastroenterology

## 2024-04-23 ENCOUNTER — Ambulatory Visit: Admitting: Gastroenterology

## 2024-04-23 VITALS — BP 122/78 | HR 65 | Ht 65.0 in | Wt 139.0 lb

## 2024-04-23 DIAGNOSIS — K429 Umbilical hernia without obstruction or gangrene: Secondary | ICD-10-CM | POA: Diagnosis not present

## 2024-04-23 DIAGNOSIS — R630 Anorexia: Secondary | ICD-10-CM

## 2024-04-23 DIAGNOSIS — R112 Nausea with vomiting, unspecified: Secondary | ICD-10-CM

## 2024-04-23 DIAGNOSIS — D373 Neoplasm of uncertain behavior of appendix: Secondary | ICD-10-CM

## 2024-04-23 MED ORDER — PANTOPRAZOLE SODIUM 40 MG PO TBEC: 40.0000 mg | DELAYED_RELEASE_TABLET | Freq: Two times a day (BID) | ORAL | 2 refills | Status: AC

## 2024-04-23 NOTE — Progress Notes (Unsigned)
 Discussed the use of AI scribe software for clinical note transcription with the patient, who gave verbal consent to proceed.  HPI : Deanna Fischer is a very pleasant 63 year old female with a history of appendiceal mucinous neoplasm, lupus, depression and PTSD who presents for further evaluation of nausea and decreased appetite.    She has been experiencing persistent nausea for several months, worsening over the past four to five weeks. The nausea is present throughout the day, particularly noticeable upon waking. There is a significant decrease in appetite, with her brain signaling hunger, but she is unable to eat much, leading to slight, unquantified weight loss. She occasionally vomits and has stopped consuming coffee to manage her symptoms.  She denies significant abdominal pain but mentions occasional reflux symptoms such as burning pain in the chest or throat. She has been trying to manage her symptoms with a bland diet, avoiding spicy foods which she typically enjoys. She has also reduced her Advil  intake, which she has used extensively in the past to manage lupus-related pain, now taking it once or twice a day with food.  Her past medical history includes lupus, for which she has been in remission since her late thirties. She has been using Advil  as her main tool for pain management. She also mentions a recent start and subsequent discontinuation of Crestor  due to fatigue and possible nausea, although she is unsure if it contributed to her symptoms.  She is currently taking Zofran  for nausea, which she finds effective but sedating. She is not taking any acid-reducing medications. She is also undergoing therapy for PTSD, which may be contributing to her gastrointestinal symptoms.       She was initially seen in our office in March 2023 after an admission for pelvic sepsis.  Patient states that she had never had any chronic GI symptoms, but experienced worsening abdominal pain with  associated nausea and vomiting over a period of 5 days in February 2023 which prompted her to go to the emergency department on February 13.  A CT scan performed in the ED showed an intra-abdominal/pelvic abscess of unclear etiology.  There is no obvious evidence of diverticulitis.  There was small bowel wall thickening/enteritis.  She was treated with percutaneous drain placement and antibiotics and discharged after 2 weeks.  The drain was removed prior to discharge.  Blood cultures were negative, but abscess cultures grew E. coli and Bacteroides.  She continued to have abdominal pain following her discharge and a repeat CT April 4 showed resolution of the abscess, but continued inflammatory changes that were concerning for appendicitis.   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  No additional treatment was needed.  A CT last month was unremarkable.    Colonoscopy April 2023 - Hemorrhoids found on perianal exam.  - Vascular-pattern-decreased mucosa in the transverse colon and in the ascending colon. Biopsied. Although Crohn's colitis is in the differential, the mucosal abnormalities were very mild.  - The examined portion of the ileum was normal. - Non-bleeding internal hemorrhoids.  - No obvious findings to explain the patient's pelvic abscess. No diverticula were identified despite a meticulous examination. The mild mucosal changes in the right colon could potentially represent Crohn's disease, but would not explain an abscess unless there were small bowel involvement in the setting of a normal terminal ileum. The patient's appendix has appeared abnormal on previous CTs, and maybe possible be responsible for the abscess.  CT ABDOMEN PELVIS W CONTRAST, 03/13/2024 FINDINGS: Lower Chest: Within normal limits. Liver: No suspicious focal findings. Gallbladder/Biliary: Unremarkable. Spleen: Accessory splenule. Pancreas:  Unremarkable. Adrenals: Unremarkable. . Kidneys: Normal enhancement without hydronephrosis. Small nonobstructing stones in the left upper pole collecting system. Peritoneum/Mesenteries/Extraperitoneum: Negative for ascites. Scattered tiny retroperitoneal nodes, not enlarged by size. Scattered nonenlarged mesenteric nodes. Tiny omental fat-containing periumbilical hernia. Gastrointestinal tract: Status post appendectomy and partial cecectomy. Bowel loops are nondistended. There are a few scattered colonic diverticula. Ureters: Unremarkable. Bladder: Unremarkable. Reproductive System: Unremarkable. Vascular: Scattered atherosclerotic changes throughout an otherwise nonaneurysmal abdominal aorta. Musculoskeletal: Polyarticular degenerative changes. Degenerative changes throughout the visualized spine. Abdominal wall soft tissues unremarkable. IMPRESSION:Status post appendectomy/partial cecectomy for low-grade appendiceal mucinous neoplasm. No CT evidence of local disease recurrence or peritoneal disease.   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   Hyperlipidemia    Hypothyroidism 08/17/2021   Low grade mucinous neoplasm of appendix 2023   Lupus    Pneumonia    Last 15 years ago   PTSD (post-traumatic stress disorder) 04/24/2020   Systemic lupus erythematosus (HCC) 08/17/2021     Past Surgical History:  Procedure Laterality Date   APPENDECTOMY  01/05/2022   TONSILLECTOMY     WISDOM TOOTH EXTRACTION     Family History  Problem Relation Age of Onset   Depression Mother    Cancer Mother    Arthritis Mother    Breast cancer Mother    COPD Mother    Heart disease Father    Hyperlipidemia Father    Alcohol  abuse Father    Depression Father    Coronary artery disease Father    Arrhythmia Father    Heart attack Father    Breast cancer Sister    Drug abuse Sister    Colon cancer Neg Hx    Esophageal cancer Neg Hx    Stomach cancer Neg Hx    Pancreatic cancer Neg Hx    Social History   Tobacco Use   Smoking status: Former    Current packs/day: 0.00    Average packs/day: 0.3 packs/day for 25.0 years (6.3 ttl pk-yrs)    Types: Cigarettes    Start date: 08/04/1997    Quit date: 08/04/2022    Years since quitting: 1.7   Smokeless tobacco: Never  Vaping Use   Vaping status: Never Used  Substance Use Topics   Alcohol use: Not Currently    Alcohol/week: 9.0 standard drinks of alcohol    Types: 9 Glasses of wine per week   Drug use: Never   Current Outpatient Medications  Medication Sig Dispense Refill   buPROPion  (WELLBUTRIN  XL) 150 MG 24 hr tablet Take 1 tablet (150 mg total) by mouth daily. 90 tablet 3   Calcium  Carbonate-Vitamin D  (CALCIUM -D PO) Take 2 tablets by mouth every morning.     escitalopram  (LEXAPRO ) 20 MG tablet Take 1 tablet (20 mg total) by mouth daily. 90 tablet 3   FLUoxetine  (PROZAC ) 10 MG capsule Take 1 capsule (10 mg total) by mouth daily. 30 capsule 3   hydroxychloroquine  (PLAQUENIL ) 200 MG tablet Take 1 tablet (200 mg total) by mouth every morning. Take with 100 mg for total daily dosing 300 mg 90 tablet 2  ibuprofen  (ADVIL ) 200 MG tablet Take 400-800 mg by mouth every 6 (six) hours as needed (pain).     levothyroxine  (SYNTHROID ) 75 MCG tablet Take 1 tablet (75 mcg total) by mouth daily. 90 tablet 3   Multiple Vitamin (MULTIVITAMIN WITH MINERALS) TABS tablet Take 2 tablets by mouth every morning. Plant based multi vitamin     ondansetron  (ZOFRAN ) 4 MG tablet Take 1 tablet (4 mg total) by mouth every 6 (six) hours. 12 tablet 0   rosuvastatin  (CRESTOR ) 5 MG tablet Take 1 tablet (5 mg total) by mouth daily. 90 tablet 3   traMADol  (ULTRAM ) 50 MG tablet Take 1 tablet (50  mg total) by mouth every 6 (six) hours as needed. 30 tablet 0   traZODone  (DESYREL ) 50 MG tablet TAKE 1 TABLET(50 MG) BY MOUTH AT BEDTIME AS NEEDED FOR SLEEP 90 tablet 1   No current facility-administered medications for this visit.   Allergies  Allergen Reactions   Amoxicillin -Pot Clavulanate     Other Reaction(s): Diarrhea, GI Intolerance   Amoxicillin  Nausea And Vomiting    Caused nausea, vomiting and diarrhea   Penicillins Nausea And Vomiting    Other Reaction(s): GI Intolerance  Caused nausea, vomiting and diarrhea     Review of Systems: All systems reviewed and negative except where noted in HPI.    MR BREAST BILATERAL W WO CONTRAST INC CAD Result Date: 04/15/2024 CLINICAL DATA:  63 year old female with family history of breast cancer, high-risk screening. EXAM: BILATERAL BREAST MRI WITH AND WITHOUT CONTRAST TECHNIQUE: Multiplanar, multisequence MR images of both breasts were obtained prior to and following the intravenous administration of 7.5 ml of Vueway Three-dimensional MR images were rendered by post-processing of the original MR data on an independent workstation. The three-dimensional MR images were interpreted, and findings are reported in the following complete MRI report for this study. Three dimensional images were evaluated at the independent interpreting workstation using the DynaCAD thin client. COMPARISON:  MRI dated 09/14/2021. FINDINGS: Breast composition: c. Heterogeneous fibroglandular tissue. Background parenchymal enhancement: Moderate. Right breast: No mass or abnormal enhancement. Left breast: No mass or abnormal enhancement. Lymph nodes: No abnormal appearing lymph nodes. Ancillary findings:  None. IMPRESSION: No MRI evidence of malignancy in either breast. RECOMMENDATION: Routine screening mammogram in August 2026. Continue high-risk breast MRI as clinically appropriate BI-RADS CATEGORY  1: Negative. Electronically Signed   By: Curtistine Noble   On: 04/15/2024  12:01    Physical Exam: BP 122/78   Pulse 65   Ht 5' 5 (1.651 m)   Wt 139 lb (63 kg)   BMI 23.13 kg/m  Constitutional: Pleasant,well-developed, Caucasian female in no acute distress. HEENT: Normocephalic and atraumatic. Conjunctivae are normal. No scleral icterus. Neck supple.  Cardiovascular: Normal rate, regular rhythm.  Pulmonary/chest: Effort normal and breath sounds normal. No wheezing, rales or rhonchi. Abdominal: Soft, nondistended, nontender. Bowel sounds active throughout. There are no masses palpable. No hepatomegaly. Extremities: no edema Lymphadenopathy: No cervical adenopathy noted. Neurological: Alert and oriented to person place and time. Skin: Skin is warm and dry. No rashes noted. Psychiatric: Normal mood and affect. Behavior is normal.  CBC    Component Value Date/Time   WBC 6.0 02/26/2024 1111   RBC 3.96 02/26/2024 1111   HGB 13.9 02/26/2024 1111   HCT 40.2 02/26/2024 1111   PLT 192.0 02/26/2024 1111   MCV 101.4 (H) 02/26/2024 1111   MCH 33.8 05/03/2023 1345   MCHC 34.5 02/26/2024 1111   RDW 13.5 02/26/2024 1111  LYMPHSABS 1.5 05/03/2023 1345   MONOABS 0.7 05/03/2023 1345   EOSABS 0.1 05/03/2023 1345   BASOSABS 0.0 05/03/2023 1345    CMP     Component Value Date/Time   NA 143 02/26/2024 1111   K 4.3 02/26/2024 1111   CL 103 02/26/2024 1111   CO2 28 02/26/2024 1111   GLUCOSE 88 02/26/2024 1111   BUN 10 02/26/2024 1111   CREATININE 0.61 02/26/2024 1111   CALCIUM  9.3 02/26/2024 1111   PROT 7.3 02/26/2024 1111   ALBUMIN 4.5 02/26/2024 1111   AST 33 02/26/2024 1111   ALT 23 02/26/2024 1111   ALKPHOS 58 02/26/2024 1111   BILITOT 0.4 02/26/2024 1111   GFRNONAA >60 05/03/2023 1345       Latest Ref Rng & Units 02/26/2024   11:11 AM 05/03/2023    1:45 PM 02/08/2023   11:07 AM  CBC EXTENDED  WBC 4.0 - 10.5 K/uL 6.0  5.5  5.8   RBC 3.87 - 5.11 Mil/uL 3.96  3.67  3.93   Hemoglobin 12.0 - 15.0 g/dL 86.0  87.5  86.8   HCT 36.0 - 46.0 % 40.2   35.7  39.5   Platelets 150.0 - 400.0 K/uL 192.0  175  250.0   NEUT# 1.7 - 7.7 K/uL  3.1  3.6   Lymph# 0.7 - 4.0 K/uL  1.5  1.3       ASSESSMENT AND PLAN:  63 year old female with history of depression/anxiety, PTSD, lupus, mucinous neoplasm of the appendix status post resection, with several months of frequent nausea, worse over the last 4 to 5 weeks with decreased appetite.  No significant abdominal pain.  She does take NSAIDs frequently for arthralgias attributed to lupus.  Nausea with decreased appetite and weight loss, possible peptic ulcer disease due to chronic NSAID use Chronic nausea, decreased appetite, and weight loss likely due to peptic ulcer disease from NSAID use. Consider H. pylori infection and stress/PTSD impact on symptoms. - Schedule upper endoscopy to evaluate for peptic ulcer disease and obtain biopsies for H. pylori. - Prescribe Protonix  40 mg twice daily for acid reduction. - Advise discontinuation of Advil  and suggest using Tylenol  for pain management. - Recommend a bland diet, avoiding spicy, fatty, and greasy foods. - Discussed the potential role of stress and PTSD in exacerbating gastrointestinal symptoms.  Umbilical hernia, asymptomatic and incidental Small umbilical hernia found on CT scan, asymptomatic, no intervention needed.  History of mucinous neoplasm of the appendix - Followed by surgical oncology - Recommended to undergo annual CT/MRI (most recent September 2025)  The details, risks (including bleeding, perforation, infection, missed lesions, medication reactions and possible hospitalization or surgery if complications occur), benefits, and alternatives to EGD with possible biopsy and possible dilation were discussed with the patient and she consents to proceed.    Recording duration: 17 minutes     Quida Glasser E. Stacia, MD [facility]   Rollene Almarie LABOR, *

## 2024-04-23 NOTE — Patient Instructions (Addendum)
 We have sent the following medications to your pharmacy for you to pick up at your convenience: pantoprazole  40 mg twice daily.   You have been scheduled for an endoscopy. Please follow written instructions given to you at your visit today.  If you use inhalers (even only as needed), please bring them with you on the day of your procedure.  If you take any of the following medications, they will need to be adjusted prior to your procedure:   DO NOT TAKE 7 DAYS PRIOR TO TEST- Trulicity (dulaglutide) Ozempic, Wegovy (semaglutide) Mounjaro (tirzepatide) Bydureon Bcise (exanatide extended release)  DO NOT TAKE 1 DAY PRIOR TO YOUR TEST Rybelsus (semaglutide) Adlyxin (lixisenatide) Victoza (liraglutide) Byetta (exanatide) ___________________________________________________________________________ Please avoid all NSAID's. Some examples of NSAID's are as follows: Aspirin (Bufferin, Bayer, and Excedrin) Ibuprofen  (Advil , Motrin , Nuprin ) Ketoprofen (Actron, Orudis) Naproxen  (Aleve ) Daypro  Indocin  Lodine  Naprosyn   Relafen  Vimovo Voltaren  _______________________________________________________  If your blood pressure at your visit was 140/90 or greater, please contact your primary care physician to follow up on this.  _______________________________________________________  If you are age 19 or older, your body mass index should be between 23-30. Your Body mass index is 23.13 kg/m. If this is out of the aforementioned range listed, please consider follow up with your Primary Care Provider.  If you are age 53 or younger, your body mass index should be between 19-25. Your Body mass index is 23.13 kg/m. If this is out of the aformentioned range listed, please consider follow up with your Primary Care Provider.   ________________________________________________________  The [facility] providers would like to encourage you to use MYCHART to communicate with providers for  non-urgent requests or questions.  Due to long hold times on the telephone, sending your provider a message by [facility] may be a faster and more efficient way to get a response.  Please allow 48 business hours for a response.  Please remember that this is for non-urgent requests.  _______________________________________________________  [facility] is using a team-based approach to care.  Your team is made up of your doctor and two to three APPS. Our APPS (Nurse Practitioners and Physician Assistants) work with your physician to ensure care continuity for you. They are fully qualified to address your health concerns and develop a treatment plan. They communicate directly with your gastroenterologist to care for you. Seeing the Advanced Practice Practitioners on your physician's team can help you by facilitating care more promptly, often allowing for earlier appointments, access to diagnostic testing, procedures, and other specialty referrals.

## 2024-05-20 ENCOUNTER — Ambulatory Visit: Admitting: Internal Medicine

## 2024-05-20 ENCOUNTER — Encounter: Payer: Self-pay | Admitting: Gastroenterology

## 2024-05-20 ENCOUNTER — Ambulatory Visit (AMBULATORY_SURGERY_CENTER): Admitting: Gastroenterology

## 2024-05-20 VITALS — BP 117/76 | HR 62 | Temp 98.2°F | Resp 14 | Ht 65.0 in | Wt 139.0 lb

## 2024-05-20 DIAGNOSIS — K2100 Gastro-esophageal reflux disease with esophagitis, without bleeding: Secondary | ICD-10-CM

## 2024-05-20 DIAGNOSIS — K319 Disease of stomach and duodenum, unspecified: Secondary | ICD-10-CM | POA: Diagnosis not present

## 2024-05-20 DIAGNOSIS — K449 Diaphragmatic hernia without obstruction or gangrene: Secondary | ICD-10-CM

## 2024-05-20 DIAGNOSIS — R112 Nausea with vomiting, unspecified: Secondary | ICD-10-CM

## 2024-05-20 MED ORDER — SODIUM CHLORIDE 0.9 % IV SOLN: 500.0000 mL | Freq: Once | INTRAVENOUS | Status: DC

## 2024-05-20 NOTE — Patient Instructions (Addendum)
 Resume previous diet.  Continue present medications.  Await pathology results.   Follow anti-reflux diet.   Use Protonix  (pantoprazole ) 40 mg PO daily for 4 weeks.   YOU HAD AN ENDOSCOPIC PROCEDURE TODAY AT THE [facility]:   Refer to the procedure report that was given to you for any specific questions about what was found during the examination.  If the procedure report does not answer your questions, please call your gastroenterologist to clarify.  If you requested that your care partner not be given the details of your procedure findings, then the procedure report has been included in a sealed envelope for you to review at your convenience later.  YOU SHOULD EXPECT: Some feelings of bloating in the abdomen. Passage of more gas than usual.  Walking can help get rid of the air that was put into your GI tract during the procedure and reduce the bloating. If you had a lower endoscopy (such as a colonoscopy or flexible sigmoidoscopy) you may notice spotting of blood in your stool or on the toilet paper. If you underwent a bowel prep for your procedure, you may not have a normal bowel movement for a few days.  Please Note:  You might notice some irritation and congestion in your nose or some drainage.  This is from the oxygen used during your procedure.  There is no need for concern and it should clear up in a day or so.  SYMPTOMS TO REPORT IMMEDIATELY:  Following upper endoscopy (EGD)  Vomiting of blood or coffee ground material  New chest pain or pain under the shoulder blades  Painful or persistently difficult swallowing  New shortness of breath  Fever of 100F or higher  Black, tarry-looking stools  For urgent or emergent issues, a gastroenterologist can be reached at any hour by calling [phone]. Do not use MyChart messaging for urgent concerns.    DIET:  We do recommend a small meal at first, but then you may proceed to your regular diet.  Drink plenty of fluids but  you should avoid alcoholic beverages for 24 hours.  ACTIVITY:  You should plan to take it easy for the rest of today and you should NOT DRIVE or use heavy machinery until tomorrow (because of the sedation medicines used during the test).    FOLLOW UP: Our staff will call the number listed on your records the next business day following your procedure.  We will call around 7:15- 8:00 am to check on you and address any questions or concerns that you may have regarding the information given to you following your procedure. If we do not reach you, we will leave a message.     If any biopsies were taken you will be contacted by phone or by letter within the next 1-3 weeks.  Please call us  at [phone] if you have not heard about the biopsies in 3 weeks.    SIGNATURES/CONFIDENTIALITY: You and/or your care partner have signed paperwork which will be entered into your electronic medical record.  These signatures attest to the fact that that the information above on your After Visit Summary has been reviewed and is understood.  Full responsibility of the confidentiality of this discharge information lies with you and/or your care-partner.

## 2024-05-20 NOTE — Progress Notes (Signed)
 Vss nad trans to pacu

## 2024-05-20 NOTE — Progress Notes (Signed)
 History and Physical Interval Note:  05/20/2024 9:44 AM  Deanna Fischer  has presented today for endoscopic procedure(s), with the diagnosis of  Encounter Diagnosis  Name Primary?   Nausea and vomiting, unspecified vomiting type Yes  .  The various methods of evaluation and treatment have been discussed with the patient and/or family. After consideration of risks, benefits and other options for treatment, the patient has consented to  the endoscopic procedure(s).   The patient's history has been reviewed, patient examined, no change in status, stable for endoscopic procedure(s).  I have reviewed the patient's chart and labs.  Questions were answered to the patient's satisfaction.     Reann Dobias E. Stacia, MD [facility]

## 2024-05-20 NOTE — Progress Notes (Signed)
 Pt's states no medical or surgical changes since previsit or office visit.

## 2024-05-20 NOTE — Op Note (Signed)
 [facility] Patient Name: Deanna Fischer Procedure Date: 05/20/2024 9:48 AM MRN: [id number] Endoscopist: Glendia E. Stacia , MD, [id number] Age: 63 Referring MD:  Date of Birth: 1960/07/16 Gender: Female Account #: [id number] Procedure:                Upper GI endoscopy Indications:              Nausea with vomiting Medicines:                Monitored Anesthesia Care Procedure:                Pre-Anesthesia Assessment:                           - Prior to the procedure, a History and Physical                            was performed, and patient medications and                            allergies were reviewed. The patient's tolerance of                            previous anesthesia was also reviewed. The risks                            and benefits of the procedure and the sedation                            options and risks were discussed with the patient.                            All questions were answered, and informed consent                            was obtained. Prior Anticoagulants: The patient has                            taken no anticoagulant or antiplatelet agents. ASA                            Grade Assessment: II - A patient with mild systemic                            disease. After reviewing the risks and benefits,                            the patient was deemed in satisfactory condition to                            undergo the procedure.                           After obtaining informed consent, the endoscope was  passed under direct vision. Throughout the                            procedure, the patient's blood pressure, pulse, and                            oxygen saturations were monitored continuously. The                            GIF [id number] #[id number] was introduced through the                            mouth, and advanced to the second part of duodenum.                            The upper GI endoscopy was  accomplished without                            difficulty. The patient tolerated the procedure                            well. Scope In: Scope Out: Findings:                 LA Grade A (one or more mucosal breaks less than 5                            mm, not extending between tops of 2 mucosal folds)                            esophagitis was found.                           The exam of the esophagus was otherwise normal.                           A small hiatal hernia was present.                           Normal mucosa was found in the entire examined                            stomach. Biopsies were taken with a cold forceps                            for Helicobacter pylori testing. Estimated blood                            loss was minimal.                           The examined duodenum was normal. Complications:            No immediate complications. Estimated Blood Loss:     Estimated blood loss was minimal. Impression:               -  LA Grade A reflux esophagitis.                           - Small hiatal hernia.                           - Normal mucosa was found in the entire stomach.                            Biopsied.                           - Normal examined duodenum.                           - Patient's nausea may be secondary to acid reflux. Recommendation:           - Patient has a contact number available for                            emergencies. The signs and symptoms of potential                            delayed complications were discussed with the                            patient. Return to normal activities tomorrow.                            Written discharge instructions were provided to the                            patient.                           - Resume previous diet.                           - Continue present medications.                           - Await pathology results.                           - Use Protonix  (pantoprazole ) 40 mg  PO daily for 4                            weeks.                           - Follow anti-reflux diet/behavioral modifications. Shawndell Varas E. Stacia, MD 05/20/2024 10:06:01 AM This report has been signed electronically.

## 2024-05-21 ENCOUNTER — Telehealth: Payer: Self-pay

## 2024-05-21 NOTE — Telephone Encounter (Signed)
  Follow up Call-     05/20/2024    9:36 AM 10/18/2021    3:19 PM  Call back number  Post procedure Call Back phone  # [phone]  Permission to leave phone message Yes Yes     Patient questions:  Do you have a fever, pain , or abdominal swelling? No. Pain Score  0 *  Have you tolerated food without any problems? Yes.    Have you been able to return to your normal activities? Yes.    Do you have any questions about your discharge instructions: Diet   No. Medications  No. Follow up visit  No.  Do you have questions or concerns about your Care? No.  Actions: * If pain score is 4 or above: No action needed, pain <4.

## 2024-05-22 LAB — SURGICAL PATHOLOGY

## 2024-05-25 ENCOUNTER — Ambulatory Visit: Payer: Self-pay | Admitting: Gastroenterology

## 2024-05-25 NOTE — Progress Notes (Signed)
 Ms. Jewel,  The biopsies taken from your stomach were notable for mild reactive gastropathy which is a common finding and often related to use of certain medications (usually NSAIDs), but there was no evidence of Helicobacter pylori infection. This common finding is not felt to necessarily be a cause of any particular symptom and there is no specific treatment or further evaluation recommended.  Please take the pantoprazole  daily to help with reflux symptoms and follow up as needed.

## 2024-06-07 ENCOUNTER — Ambulatory Visit: Admitting: Gastroenterology

## 2024-06-11 ENCOUNTER — Telehealth: Payer: Self-pay

## 2024-06-11 NOTE — Telephone Encounter (Signed)
 Copied from CRM #[id number]. Topic: Clinical - Request for Lab/Test Order >> Jun 11, 2024 11:29 AM Franky GRADE wrote: Reason for CRM: Patient is calling because she is trying to get her Bone Density scheduled; however, Imaging is requiring we fax over the order immediately to them as they are holding an appointment for her for this Saturday. Fax [phone] attention Jacques.

## 2024-06-11 NOTE — Telephone Encounter (Signed)
 Was ordered Jan 2025 ok to fax

## 2024-06-13 ENCOUNTER — Other Ambulatory Visit (HOSPITAL_BASED_OUTPATIENT_CLINIC_OR_DEPARTMENT_OTHER): Payer: Self-pay | Admitting: Internal Medicine

## 2024-06-13 DIAGNOSIS — M85859 Other specified disorders of bone density and structure, unspecified thigh: Secondary | ICD-10-CM

## 2024-06-13 NOTE — Telephone Encounter (Signed)
 Order has been faxed

## 2024-06-15 ENCOUNTER — Other Ambulatory Visit (HOSPITAL_BASED_OUTPATIENT_CLINIC_OR_DEPARTMENT_OTHER)

## 2024-06-17 ENCOUNTER — Ambulatory Visit: Payer: Self-pay

## 2024-06-17 NOTE — Telephone Encounter (Signed)
 Outbound call attempted. No answer. Message left on voicemail asking patient to return call for appointment or to speak to a nurse.

## 2024-06-19 ENCOUNTER — Ambulatory Visit: Admitting: Internal Medicine

## 2024-06-19 ENCOUNTER — Other Ambulatory Visit: Payer: Self-pay | Admitting: Internal Medicine

## 2024-07-08 ENCOUNTER — Ambulatory Visit: Admitting: Internal Medicine

## 2024-07-11 ENCOUNTER — Other Ambulatory Visit (HOSPITAL_BASED_OUTPATIENT_CLINIC_OR_DEPARTMENT_OTHER)

## 2024-07-26 ENCOUNTER — Ambulatory Visit: Admitting: Internal Medicine

## 2024-08-05 ENCOUNTER — Encounter: Admitting: Internal Medicine

## 2024-08-28 ENCOUNTER — Other Ambulatory Visit

## 2024-09-10 ENCOUNTER — Encounter: Admitting: Internal Medicine
